# Patient Record
Sex: Male | Born: 1940
Health system: Southern US, Community
[De-identification: ages and names within clinical notes are randomized; demographics above are authoritative.]

## PROBLEM LIST (undated history)

## (undated) DIAGNOSIS — K5790 Diverticulosis of intestine, part unspecified, without perforation or abscess without bleeding: Secondary | ICD-10-CM

## (undated) DIAGNOSIS — K219 Gastro-esophageal reflux disease without esophagitis: Secondary | ICD-10-CM

## (undated) DIAGNOSIS — M1990 Unspecified osteoarthritis, unspecified site: Secondary | ICD-10-CM

## (undated) DIAGNOSIS — E785 Hyperlipidemia, unspecified: Secondary | ICD-10-CM

## (undated) HISTORY — DX: Gastro-esophageal reflux disease without esophagitis: K21.9

## (undated) HISTORY — DX: Unspecified osteoarthritis, unspecified site: M19.90

## (undated) HISTORY — PX: EYE SURGERY: SHX253

## (undated) HISTORY — DX: Diverticulosis of intestine, part unspecified, without perforation or abscess without bleeding: K57.90

## (undated) HISTORY — DX: Hyperlipidemia, unspecified: E78.5

---

## 2011-10-22 ENCOUNTER — Ambulatory Visit: Payer: Medicare Other

## 2011-10-22 ENCOUNTER — Encounter: Payer: Self-pay | Admitting: Internal Medicine

## 2011-10-22 ENCOUNTER — Ambulatory Visit (INDEPENDENT_AMBULATORY_CARE_PROVIDER_SITE_OTHER): Payer: Medicare Other | Admitting: Family Medicine

## 2011-10-22 VITALS — BP 136/74 | HR 48 | Temp 97.7°F | Resp 14 | Ht 70.5 in | Wt 177.0 lb

## 2011-10-22 DIAGNOSIS — M545 Low back pain, unspecified: Secondary | ICD-10-CM | POA: Diagnosis not present

## 2011-10-22 DIAGNOSIS — R109 Unspecified abdominal pain: Secondary | ICD-10-CM

## 2011-10-22 DIAGNOSIS — M25519 Pain in unspecified shoulder: Secondary | ICD-10-CM | POA: Diagnosis not present

## 2011-10-22 DIAGNOSIS — E7800 Pure hypercholesterolemia, unspecified: Secondary | ICD-10-CM

## 2011-10-22 LAB — COMPREHENSIVE METABOLIC PANEL
ALT: 16 U/L (ref 0–53)
AST: 22 U/L (ref 0–37)
Albumin: 4.7 g/dL (ref 3.5–5.2)
Alkaline Phosphatase: 76 U/L (ref 39–117)
BUN: 18 mg/dL (ref 6–23)
CO2: 28 mEq/L (ref 19–32)
Calcium: 9.6 mg/dL (ref 8.4–10.5)
Chloride: 105 mEq/L (ref 96–112)
Creat: 1.07 mg/dL (ref 0.50–1.35)
Glucose, Bld: 87 mg/dL (ref 70–99)
Potassium: 4.4 mEq/L (ref 3.5–5.3)
Sodium: 141 mEq/L (ref 135–145)
Total Bilirubin: 0.7 mg/dL (ref 0.3–1.2)
Total Protein: 7.5 g/dL (ref 6.0–8.3)

## 2011-10-22 LAB — POCT URINALYSIS DIPSTICK
Glucose, UA: NEGATIVE
Ketones, UA: NEGATIVE
Leukocytes, UA: NEGATIVE
Nitrite, UA: NEGATIVE
Protein, UA: NEGATIVE
Spec Grav, UA: 1.02
Urobilinogen, UA: 0.2
pH, UA: 5

## 2011-10-22 LAB — POCT UA - MICROSCOPIC ONLY
Bacteria, U Microscopic: NEGATIVE
Casts, Ur, LPF, POC: NEGATIVE
Crystals, Ur, HPF, POC: NEGATIVE
Mucus, UA: NEGATIVE
WBC, Ur, HPF, POC: NEGATIVE
Yeast, UA: NEGATIVE

## 2011-10-22 LAB — POCT CBC
Granulocyte percent: 62.7 %G (ref 37–80)
HCT, POC: 45.6 % (ref 43.5–53.7)
Hemoglobin: 14.7 g/dL (ref 14.1–18.1)
Lymph, poc: 3.6 — AB (ref 0.6–3.4)
MCH, POC: 30.9 pg (ref 27–31.2)
MCHC: 32.2 g/dL (ref 31.8–35.4)
MCV: 95.9 fL (ref 80–97)
MID (cbc): 0.7 (ref 0–0.9)
MPV: 10.4 fL (ref 0–99.8)
POC Granulocyte: 7.1 — AB (ref 2–6.9)
POC LYMPH PERCENT: 31.5 %L (ref 10–50)
POC MID %: 5.8 %M (ref 0–12)
Platelet Count, POC: 284 10*3/uL (ref 142–424)
RBC: 4.75 M/uL (ref 4.69–6.13)
RDW, POC: 12.3 %
WBC: 11.3 10*3/uL — AB (ref 4.6–10.2)

## 2011-10-22 LAB — LIPID PANEL
Cholesterol: 224 mg/dL — ABNORMAL HIGH (ref 0–200)
HDL: 39 mg/dL — ABNORMAL LOW (ref >39)
LDL Cholesterol: 151 mg/dL — ABNORMAL HIGH (ref 0–99)
Total CHOL/HDL Ratio: 5.7 Ratio
Triglycerides: 168 mg/dL — ABNORMAL HIGH (ref <150)
VLDL: 34 mg/dL (ref 0–40)

## 2011-10-22 MED ORDER — CYCLOBENZAPRINE HCL 10 MG PO TABS: 10.0000 mg | ORAL_TABLET | Freq: Two times a day (BID) | ORAL | Status: AC | PRN

## 2011-10-22 NOTE — Progress Notes (Signed)
Patient Name: Fernando Saunders Date of Birth: [birth year] Medical Record Number: [id number] Gender: male Date of Encounter: 10/22/2011  History of Present Illness:  Fernando Saunders is a 71 y.o. very pleasant male patient who presents with the following:  Having right lower back pain, bilateral shoulder pain, abdominal pain today.  He does not feel that these problems are related.   Has notes the right lower back pain for about 2 weeks.  Has never had this before.  No blood in urine or dysuria. No known injury.  He is worried about his kidneys and would like them to be checked.  Left back is not painful.    Also has right and left shoulder pain especially with certain movements or at night for a couple of weeks. This is exacerbated by playing softball which he does regularly- 3rd base.   Also notes that he "ate something" bad which seemed to cause abdominal pains and diarrhea.  This occurred 10 days ago- that day he had several stools and some sharp pains.  Continues to have occasional pains, no nausea or vomiting or diarrhea- is eating ok now.  The pain lasts for a minute or so only.   History of high cholesterol but he is no longer taking his medications.  He is fasting today and would like to have this checked.    [name] is [place] here today with his daughter who helped with interpretation  There is no problem list on file for this patient.  No past medical history on file. No past surgical history on file. History  Substance Use Topics  . Smoking status: Never Smoker   . Smokeless tobacco: Not on file  . Alcohol Use: Not on file   No family history on file. No Known Allergies  Medication list has been reviewed and updated.  Review of Systems: As per HPI- otherwise negative.   Physical Examination: Filed Vitals:   10/22/11 0852  BP: 136/74  Pulse: 48  Temp: 97.7 F (36.5 C)  TempSrc: Oral  Resp: 14  Height: 5' 10.5" (1.791 m)  Weight: 177 lb (80.287 kg)    reviewed chart- bradycardia is baseline for him  Body mass index is 25.04 kg/(m^2).  GEN: WDWN, NAD, Non-toxic, A & O x 3 HEENT: Atraumatic, Normocephalic. Neck supple. No masses, No LAD. Ears and Nose: No external deformity. CV: RRR, No M/G/R. No JVD. No thrill. No extra heart sounds. PULM: CTA B, no wheezes, crackles, rhonchi. No retractions. No resp. distress. No accessory muscle use. ABD: S, NT, ND, +BS. No rebound. No HSM.  Cannot reproduce abdominal pain EXTR: No c/c/e NEURO Normal gait.  PSYCH: Normally interactive. Conversant. Not depressed or anxious appearing.  Calm demeanor.  Indicates tenderness at right lower back, but negative straight leg raise, normal DTR.  Good flexion and extension of back Right shoulder: indicates tenderness with external and internal rotation, but has good ROM in all directions including abduction and flexion.  Left "shoulder" tenderness is actually trapezius tenderness.  Shoulder has normal ROM and no pain  Results for orders placed in visit on 10/22/11  POCT CBC      Component Value Range   WBC 11.3 (*) 4.6 - 10.2 (K/uL)   Lymph, poc 3.6 (*) 0.6 - 3.4    POC LYMPH PERCENT 31.5  10 - 50 (%L)   MID (cbc) 0.7  0 - 0.9    POC MID % 5.8  0 - 12 (%M)   POC Granulocyte 7.1 (*)  2 - 6.9    Granulocyte percent 62.7  37 - 80 (%G)   RBC 4.75  4.69 - 6.13 (M/uL)   Hemoglobin 14.7  14.1 - 18.1 (g/dL)   HCT, POC 40.9  81.1 - 53.7 (%)   MCV 95.9  80 - 97 (fL)   MCH, POC 30.9  27 - 31.2 (pg)   MCHC 32.2  31.8 - 35.4 (g/dL)   RDW, POC 91.4     Platelet Count, POC 284  142 - 424 (K/uL)   MPV 10.4  0 - 99.8 (fL)  POCT URINALYSIS DIPSTICK      Component Value Range   Color, UA yellow     Clarity, UA clear     Glucose, UA neg     Bilirubin, UA eng     Ketones, UA neg     Spec Grav, UA 1.020     Blood, UA trace-intact     pH, UA 5.0     Protein, UA neg     Urobilinogen, UA 0.2     Nitrite, UA neg     Leukocytes, UA Negative    POCT UA - MICROSCOPIC  ONLY      Component Value Range   WBC, Ur, HPF, POC neg     RBC, urine, microscopic 0-1     Bacteria, U Microscopic neg     Mucus, UA neg     Epithelial cells, urine per micros 0-1     Crystals, Ur, HPF, POC neg     Casts, Ur, LPF, POC neg     Yeast, UA neg     [facility] reading (PRIMARY) by  Dr. Patsy Lager.  Mild degenerative change especially L2-3  LUMBAR SPINE - 2-3 VIEW  Comparison: None.  Findings: Normal lumbar segmentation. Multilevel degenerative endplate spurring. Relatively preserved disc spaces and vertebral body height. No spondylolisthesis. Bone mineralization is within normal limits. Mild to moderate L5-S1 facet hypertrophy. Negative visualized sacrum.  IMPRESSION: Degenerative endplate spurring. No acute osseous abnormality in the lumbar spine.   Assessment and Plan: 1. Lumbar pain  POCT urinalysis dipstick, POCT UA - Microscopic Only, DG Lumbar Spine 2-3 Views, cyclobenzaprine (FLEXERIL) 10 MG tablet  2. High cholesterol  POCT CBC, Comprehensive metabolic panel, Lipid panel  3. Shoulder pain    4. Abdominal  pain, other specified site     Lumbar pain seems to be MSK in origin.  Gave flexeril to use PRN- may also be helpful for right RCT and left trapezius strain.  Reassured that UA looks fine, but will further check kidneys with CMP and will follow-up pending these results.   [name] is not interested in an ortho referral as of yet for his shoulder, but will let me know if he changes his mind.  Given handout of RCT exercises to do.  He may need to avoid doing a lot of throwing as he does in softball practice to allow this to heal.   Abdominal pain: lack of any tenderness with exam and description of pain is reassuring, but need to watch carefully especially with slightly elevated wbc count.  He has never had a colonoscopy explained the importance of this and they do plan to arrange with [place] asap. In the meantime will await CMP but let me know if symptoms change or get  worse in the meantime.

## 2011-10-22 NOTE — Patient Instructions (Signed)
May use tylenol arthritis as needed for back and shoulder pain.   Flexeril as needed for pain.   Let me know if you are not getting better- sooner if worse!

## 2011-10-24 ENCOUNTER — Ambulatory Visit (AMBULATORY_SURGERY_CENTER): Payer: Medicare Other | Admitting: *Deleted

## 2011-10-24 ENCOUNTER — Encounter: Payer: Self-pay | Admitting: Family Medicine

## 2011-10-24 VITALS — Ht 71.0 in | Wt 182.2 lb

## 2011-10-24 DIAGNOSIS — Z1211 Encounter for screening for malignant neoplasm of colon: Secondary | ICD-10-CM

## 2011-10-24 MED ORDER — PEG-KCL-NACL-NASULF-NA ASC-C 100 G PO SOLR: ORAL | Status: DC

## 2011-10-24 MED ORDER — PRAVASTATIN SODIUM 40 MG PO TABS
40.0000 mg | ORAL_TABLET | Freq: Every day | ORAL | Status: DC
Start: 2011-10-24 — End: 2013-11-12

## 2011-10-24 NOTE — Progress Notes (Signed)
Pt here for PV for screening colonoscopy.  Pt speaks very little [place]; his daughter is with him to interpret.  Pt says that he is having problems with abdominal pain. Pain began 2 weeks ago in the evening.  He described the pain as a dull pain below the umbilicus and said it was a 10 on the pain scale and kept him up most of the night.  Since then he has continued to have intermittent abdominal pain below umbilicus after eating and rates the pain as 3 on pain scale when it occurs.  He denies nausea and vomiting; no change in bowel habits, no rectal bleeding, no diarrhea or constipation.  Colonoscopy scheduled for 5/6 cancelled and new patient appointment with Dr. Leone Payor scheduled for 5/10 at 4:00.  Ezra Sites

## 2011-10-24 NOTE — Progress Notes (Signed)
Addended by: Abbe Amsterdam C on: 10/24/2011 11:59 AM   Modules accepted: Orders

## 2011-10-25 ENCOUNTER — Telehealth: Payer: Self-pay | Admitting: Family Medicine

## 2011-10-25 NOTE — Telephone Encounter (Signed)
Called and spoke to his daughter again.  Phone note from GI nurse sounds like his abdominal pain is more severe than he admitted at our visit.  If he is having pain that is lasting more than just a minute or two, please bring him in for a recheck right away.  She agreed and will keep a close eye on her dad

## 2011-10-29 ENCOUNTER — Encounter: Payer: Self-pay | Admitting: Internal Medicine

## 2011-10-29 ENCOUNTER — Ambulatory Visit (INDEPENDENT_AMBULATORY_CARE_PROVIDER_SITE_OTHER): Payer: Medicare Other | Admitting: Internal Medicine

## 2011-10-29 VITALS — BP 132/74 | HR 88

## 2011-10-29 DIAGNOSIS — Z1211 Encounter for screening for malignant neoplasm of colon: Secondary | ICD-10-CM

## 2011-10-29 DIAGNOSIS — R1033 Periumbilical pain: Secondary | ICD-10-CM | POA: Diagnosis not present

## 2011-10-29 DIAGNOSIS — K219 Gastro-esophageal reflux disease without esophagitis: Secondary | ICD-10-CM | POA: Diagnosis not present

## 2011-10-29 MED ORDER — OMEPRAZOLE MAGNESIUM 20 MG PO TBEC: 20.0000 mg | DELAYED_RELEASE_TABLET | Freq: Every day | ORAL | Status: DC

## 2011-10-29 NOTE — Patient Instructions (Signed)
You have been scheduled for a colonoscopy with propofol. Please follow written instructions given to you at your visit today.     Reflujo gastroesofgico - Adultos  (Gastroesophageal Reflux Disease, Adult)  El reflujo gastroesofgico ocurre cuando el cido del estmago pasa al esfago. Cuando el cido entra en contacto con el esfago, el cido provoca dolor (inflamacin) en el esfago. Con el tiempo, pueden formarse pequeos agujeros (lceras) en el revestimiento del esfago. CAUSAS   Exceso de Runner, broadcasting/film/video. Esto aplica presin [employer], lo que hace que el cido del estmago suba hacia el esfago.   El hbito de fumar [place] la produccin de cido en el [place].   El consumo de alcohol. Provoca disminucin de la presin en el esfnter esofgico inferior (vlvula o anillo de msculo entre el esfago y Investment banker, corporate), permitiendo que el cido del estmago suba hacia el esfago.   Cenas a ltima hora del da y estmago lleno. Aumenta la presin y la produccin de cido en el estmago.   Malformacin en el esfnter esofgico inferior.  A menudo no se halla causa.  SNTOMAS   Ardor y Radiographer, therapeutic parte inferior del pecho detrs del esternn y en la zona media del [place]. Puede ocurrir [employer] por semana o ms a menudo.   Dificultad para tragar.   Dolor de Advertising copywriter.   Tos seca.   Sntomas similares al asma que incluyen sensacin de opresin en el pecho, falta de aire y sibilancias.  DIAGNSTICO  El mdico diagnosticar el problema basndose en los sntomas. En algunos casos, se indican radiografas y otras pruebas para verificar si hay complicaciones o para comprobar el estado del [address] y Training and development officer.  TRATAMIENTO  El mdico le indicar medicamentos de venta libre o recetados para ayudar a disminuir la produccin de cido. Consulte con su mdico antes de Corporate investment banker o agregar cualquier medicamento nuevo.  INSTRUCCIONES PARA EL CUIDADO EN EL HOGAR   Modifique los factores que  pueda cambiar. Consulte con su mdico para solicitar orientacin relacionada con la prdida de peso, dejar de fumar y el consumo de alcohol.   Evite las comidas y bebidas que [address] [place], [place]:   [place] con cafena o alcohlicas.   Chocolate.   Sabores a Advertising account planner.   Ajo y cebolla.   Comidas muy condimentadas.   Ctricos como naranjas, limones o limas.   Alimentos que contengan tomate, como salsas, [place] y pizza.   Alimentos fritos y Lexicographer.   Evite acostarse durante 3 horas antes de irse a dormir o antes de tomar una siesta.   Haga comidas pequeas durante Glass blower/designer de 3 comidas abundantes.   Use ropas sueltas. No use nada apretado alrededor de la cintura que cause presin en el estmago.   Levante (eleve) la cabecera de la cama 6 a 8 pulgadas (15 a 20 cm) con bloques de madera. Usar almohadas extra no ayuda.   Solo tome medicamentos que se pueden comprar sin receta o recetados para el dolor, Dentist o fiebre, como le indica el mdico.   No tome aspirina, ibuprofeno ni antiinflamatorios no esteroides.  SOLICITE ATENCIN MDICA DE [employer] SI:   [employer], el cuello, la [place], los dientes o la espalda.   El dolor aumenta o cambia la intensidad o la durancin.   Tiene nuseas, vmitos o sudoracin(diaforesis).   Siente falta de aire o dolor en el pecho, o se desmaya.   Vomita y el vmito tiene [place], [place]  de color verde, amarillo, negro o es similar a la borra del caf o tiene [place].   Las heces son rojas, sanguinolentas o negras.  Estos sntomas pueden ser signos de [address] - [address], como enfermedades cardacas, hemorragias gstrias o sangrado esofgico.  ASEGRESE DE QUE:   Comprende estas instrucciones.   Controlar su enfermedad.   Solicitar ayuda de inmediato si no mejora o si empeora.  Document Released: 03/27/2005 Document Revised: 06/06/2011 [facility] Patient Information 2012 [place], [place].

## 2011-10-29 NOTE — Progress Notes (Signed)
Subjective:    Patient ID: Fernando Saunders, male    DOB: 06-11-1941, 71 y.o.   MRN: [id number]  HPI This 71 year old married man, from the [place], is here with his daughter today to discuss problems with periumbilical pain and chest discomfort. For several months or more he said burning left chest discomfort, also described as a pressure, that is relieved with belching, and sometimes requires drinking carbonated beverages to induce this belch with relief. Sometimes it will get this with exertion but always is relieved by these maneuvers above. In general it occurs after eating. More recently, about 2 weeks ago he had some sort of student does seem to upset his stomach and he developed crampy periumbilical pain and one day of diarrhea. There is no further diarrhea but there is some residual periumbilical sharp pain at times. He will get pain that radiates up into the epigastrium and left chest area. There is no dysphagia. He has not lost any weight. There are clear foods other than the student will precipitate these symptoms. He has not tried any antacids or other medications. There has been no bleeding and his hemoglobin is normal.  GI review of systems is otherwise negative.  History was taken through his daughter, serving as interpreter. Medications, allergies, past medical history, past surgical history, family history and social history are reviewed and updated in the EMR.   Review of Systems This is positive for recently been diagnosed with hyperlipidemia and starting pravastatin, he has low-back pain that is improved with cyclobenzaprine, the remainder of the review of systems is negative or as per the history of present illness.    Objective:   Physical Exam General:  Well-developed, well-nourished and in no acute distress Eyes:  anicteric. Bilateral arcus ENT:   Mouth and posterior pharynx free of lesions.  Neck:   supple w/o thyromegaly or mass.  Lungs: Clear to  auscultation bilaterally. Heart:  S1S2, no rubs, murmurs, gallops. Abdomen:  soft, non-tender, no hepatosplenomegaly, hernia, or mass and BS+.  Rectal: Deferred until colonoscopy Lymph:  no cervical or supraclavicular adenopathy. Extremities:   no edema Skin   no rash. Neuro:  A&O x 3.  Psych:  appropriate mood and  Affect.   Data Reviewed: Lab Results  Component Value Date   WBC 11.3* 10/22/2011   HGB 14.7 10/22/2011   HCT 45.6 10/22/2011   MCV 95.9 10/22/2011     Chemistry      Component Value Date/Time   NA 141 10/22/2011 1037   K 4.4 10/22/2011 1037   CL 105 10/22/2011 1037   CO2 28 10/22/2011 1037   BUN 18 10/22/2011 1037   CREATININE 1.07 10/22/2011 1037      Component Value Date/Time   CALCIUM 9.6 10/22/2011 1037   ALKPHOS 76 10/22/2011 1037   AST 22 10/22/2011 1037   ALT 16 10/22/2011 1037   BILITOT 0.7 10/22/2011 1037      I have reviewed primary care notes of 10/22/2011 also noted a urinalysis was negative.       Assessment & Plan:   1. GERD (gastroesophageal reflux disease)   2. Periumbilical abdominal pain   I think these pains  in the left chest and periumbilical region are probably GERD. The periumbilical pain could be some residual from a gastroenteritis it seems like he had 2 weeks ago. He will start Prilosec OTC 20 mg daily before breakfast. Samples and prescription given. GERD instructions to be provided to the patient as well. We'll reassess at  colonoscopy and plan for office followup. He may not need long term continuous PPI, we'll see about moving to intermittent therapy or H2 blocker possibly over time. Should he develop dysphagia, bleeding or weight loss than pursue upper endoscopy but he does not have any warning signs or symptoms endoscopy not indicated now.   3. Special screening for malignant neoplasms, colon   Colonoscopy appointment will be arranged.     CC: Abbe Amsterdam, MD

## 2011-11-04 ENCOUNTER — Ambulatory Visit (AMBULATORY_SURGERY_CENTER): Payer: Medicare Other | Admitting: Internal Medicine

## 2011-11-04 ENCOUNTER — Encounter: Payer: Self-pay | Admitting: Internal Medicine

## 2011-11-04 ENCOUNTER — Other Ambulatory Visit: Payer: Self-pay | Admitting: Internal Medicine

## 2011-11-04 VITALS — BP 146/73 | HR 57 | Temp 95.5°F | Resp 14 | Ht 71.0 in | Wt 177.0 lb

## 2011-11-04 DIAGNOSIS — Z1211 Encounter for screening for malignant neoplasm of colon: Secondary | ICD-10-CM

## 2011-11-04 DIAGNOSIS — R1033 Periumbilical pain: Secondary | ICD-10-CM

## 2011-11-04 DIAGNOSIS — K5730 Diverticulosis of large intestine without perforation or abscess without bleeding: Secondary | ICD-10-CM

## 2011-11-04 DIAGNOSIS — K648 Other hemorrhoids: Secondary | ICD-10-CM

## 2011-11-04 HISTORY — PX: COLONOSCOPY: SHX174

## 2011-11-04 MED ORDER — SODIUM CHLORIDE 0.9 % IV SOLN: 500.0000 mL | INTRAVENOUS | Status: DC

## 2011-11-04 NOTE — Op Note (Signed)
[facility] [address] [place], [place]  [address]  COLONOSCOPY PROCEDURE REPORT  PATIENT:  Fernando, Saunders  MR#:  [id number] BIRTHDATE:  02-08-1941, 70 yrs. old  GENDER:  male ENDOSCOPIST:  Iva Boop, MD, [facility] REF. BY:  Abbe Amsterdam, M.D. PROCEDURE DATE:  11/04/2011 PROCEDURE:  Average-risk screening colonoscopy G0121 ASA CLASS:  Class I INDICATIONS:  Routine Risk Screening MEDICATIONS:   These medications were titrated to patient response per physician's verbal order, Fentanyl 75 mcg IV, Versed 10 mg IV  DESCRIPTION OF PROCEDURE:   After the risks benefits and alternatives of the procedure were thoroughly explained, informed consent was obtained.  Digital rectal exam was performed and revealed no abnormalities and normal prostate.   The LB CF-[id number] endoscope was introduced through the anus and advanced to the cecum, which was identified by both the appendix and ileocecal valve, without limitations.  The quality of the prep was excellent, using MoviPrep.  The instrument was then slowly withdrawn as the colon was fully examined. <<PROCEDUREIMAGES>>  FINDINGS:  Mild diverticulosis was found in the sigmoid colon. This was otherwise a normal examination of the colon. Including right colon retroflexion.   Retroflexed views in the rectum revealed no abnormalities.    The time to cecum = 4:47 minutes. The scope was then withdrawn in 11:28 minutes from the cecum and the procedure completed. COMPLICATIONS:  None ENDOSCOPIC IMPRESSION: 1) Mild diverticulosis in the sigmoid colon 2) Otherwise normal examination, excellent prep  RECOMMENDATIONS: 1) patient to call and schedule return visit with Dr. Leone Payor for July 2013- follow-up GERD and periumbilical abdominal pain  REPEAT EXAM:  In 10 year(s) for routine screening colonoscopy.  Iva Boop, MD, [facility]  CC:  Abbe Amsterdam, MD and The Patient  n. eSIGNED:   Iva Boop at 11/04/2011  09:08 AM  Manuella Ghazi, [place], [phone]

## 2011-11-04 NOTE — Patient Instructions (Addendum)
Handouts were given to your care partner on diverticulosis, high fiber diet and hemorrhoids in spanish..  Please call if any questions or concerns.    YOU HAD AN ENDOSCOPIC PROCEDURE TODAY AT THE [facility]: Refer to the procedure report that was given to you for any specific questions about what was found during the examination.  If the procedure report does not answer your questions, please call your gastroenterologist to clarify.  If you requested that your care partner not be given the details of your procedure findings, then the procedure report has been included in a sealed envelope for you to review at your convenience later.  YOU SHOULD EXPECT: Some feelings of bloating in the abdomen. Passage of more gas than usual.  Walking can help get rid of the air that was put into your GI tract during the procedure and reduce the bloating. If you had a lower endoscopy (such as a colonoscopy or flexible sigmoidoscopy) you may notice spotting of blood in your stool or on the toilet paper. If you underwent a bowel prep for your procedure, then you may not have a normal bowel movement for a few days.  DIET: Your first meal following the procedure should be a light meal and then it is ok to progress to your normal diet.  A half-sandwich or bowl of soup is an example of a good first meal.  Heavy or fried foods are harder to digest and may make you feel nauseous or bloated.  Likewise meals heavy in dairy and vegetables can cause extra gas to form and this can also increase the bloating.  Drink plenty of fluids but you should avoid alcoholic beverages for 24 hours.  ACTIVITY: Your care partner should take you home directly after the procedure.  You should plan to take it easy, moving slowly for the rest of the day.  You can resume normal activity the day after the procedure however you should NOT DRIVE or use heavy machinery for 24 hours (because of the sedation medicines used during the test).     SYMPTOMS TO REPORT IMMEDIATELY: A gastroenterologist can be reached at any hour.  During normal business hours, 8:30 AM to 5:00 PM Monday through Friday, call [phone].  After hours and on weekends, please call the GI answering service at [phone] who will take a message and have the physician on call contact you.   Following lower endoscopy (colonoscopy or flexible sigmoidoscopy):  Excessive amounts of blood in the stool  Significant tenderness or worsening of abdominal pains  Swelling of the abdomen that is new, acute  Fever of 100F or higher    FOLLOW UP: If any biopsies were taken you will be contacted by phone or by letter within the next 1-3 weeks.  Call your gastroenterologist if you have not heard about the biopsies in 3 weeks.  Our staff will call the home number listed on your records the next business day following your procedure to check on you and address any questions or concerns that you may have at that time regarding the information given to you following your procedure. This is a courtesy call and so if there is no answer at the home number and we have not heard from you through the emergency physician on call, we will assume that you have returned to your regular daily activities without incident.  SIGNATURES/CONFIDENTIALITY: You and/or your care partner have signed paperwork which will be entered into your electronic medical record.  These  signatures attest to the fact that that the information above on your After Visit Summary has been reviewed and is understood.  Full responsibility of the confidentiality of this discharge information lies with you and/or your care-partner.

## 2011-11-04 NOTE — Progress Notes (Signed)
Pressure applied to the abdomen to reach cecum. 

## 2011-11-04 NOTE — Progress Notes (Signed)
Patient did not experience any of the following events: a burn prior to discharge; a fall within the facility; wrong site/side/patient/procedure/implant event; or a hospital transfer or hospital admission upon discharge from the facility. (G8907) Patient did not have preoperative order for IV antibiotic SSI prophylaxis. (G8918)  

## 2011-11-04 NOTE — Progress Notes (Signed)
The pt's daughter is his interpreter, Derald Macleod.  Maw  No complaints noted in the recovery room. Maw

## 2011-11-05 ENCOUNTER — Telehealth: Payer: Self-pay

## 2011-11-05 NOTE — Telephone Encounter (Signed)
  Follow up Call-  Call back number 11/04/2011  Post procedure Call Back phone  # [phone]  Permission to leave phone message Yes     Patient questions:  Do you have a fever, pain , or abdominal swelling? no Pain Score  0 *  Have you tolerated food without any problems? yes  Have you been able to return to your normal activities? yes  Do you have any questions about your discharge instructions: Diet   no Medications  no Follow up visit  no  Do you have questions or concerns about your Care? no  Actions: * If pain score is 4 or above: No action needed, pain <4.

## 2011-11-08 ENCOUNTER — Ambulatory Visit: Payer: Medicare Other | Admitting: Internal Medicine

## 2011-11-28 ENCOUNTER — Ambulatory Visit: Payer: Medicare Other | Admitting: Internal Medicine

## 2012-01-17 ENCOUNTER — Encounter: Payer: Self-pay | Admitting: *Deleted

## 2012-01-20 ENCOUNTER — Ambulatory Visit: Payer: Medicare Other | Admitting: Internal Medicine

## 2012-03-04 ENCOUNTER — Ambulatory Visit (INDEPENDENT_AMBULATORY_CARE_PROVIDER_SITE_OTHER): Payer: Medicare Other | Admitting: Family Medicine

## 2012-03-04 ENCOUNTER — Ambulatory Visit (INDEPENDENT_AMBULATORY_CARE_PROVIDER_SITE_OTHER): Payer: Medicare Other | Admitting: Internal Medicine

## 2012-03-04 ENCOUNTER — Encounter: Payer: Self-pay | Admitting: Internal Medicine

## 2012-03-04 VITALS — BP 130/68 | HR 64 | Ht 71.0 in | Wt 181.8 lb

## 2012-03-04 VITALS — BP 137/66 | HR 52 | Temp 97.9°F | Resp 16 | Ht 71.0 in | Wt 181.0 lb

## 2012-03-04 DIAGNOSIS — K219 Gastro-esophageal reflux disease without esophagitis: Secondary | ICD-10-CM | POA: Diagnosis not present

## 2012-03-04 DIAGNOSIS — E7800 Pure hypercholesterolemia, unspecified: Secondary | ICD-10-CM | POA: Diagnosis not present

## 2012-03-04 DIAGNOSIS — M79609 Pain in unspecified limb: Secondary | ICD-10-CM

## 2012-03-04 DIAGNOSIS — M549 Dorsalgia, unspecified: Secondary | ICD-10-CM

## 2012-03-04 DIAGNOSIS — D72829 Elevated white blood cell count, unspecified: Secondary | ICD-10-CM | POA: Diagnosis not present

## 2012-03-04 LAB — POCT CBC
Granulocyte percent: 56.3 %G (ref 37–80)
HCT, POC: 44.9 % (ref 43.5–53.7)
Hemoglobin: 13.9 g/dL — AB (ref 14.1–18.1)
Lymph, poc: 2.5 (ref 0.6–3.4)
MCH, POC: 30.5 pg (ref 27–31.2)
MCHC: 31 g/dL — AB (ref 31.8–35.4)
MCV: 98.6 fL — AB (ref 80–97)
MID (cbc): 0.4 (ref 0–0.9)
MPV: 10.6 fL (ref 0–99.8)
POC Granulocyte: 3.8 (ref 2–6.9)
POC LYMPH PERCENT: 37.4 %L (ref 10–50)
POC MID %: 6.3 %M (ref 0–12)
Platelet Count, POC: 234 10*3/uL (ref 142–424)
RBC: 4.55 M/uL — AB (ref 4.69–6.13)
RDW, POC: 13.1 %
WBC: 6.7 10*3/uL (ref 4.6–10.2)

## 2012-03-04 LAB — LDL CHOLESTEROL, DIRECT: Direct LDL: 97 mg/dL

## 2012-03-04 MED ORDER — CYCLOBENZAPRINE HCL 10 MG PO TABS: 10.0000 mg | ORAL_TABLET | Freq: Every evening | ORAL | Status: AC | PRN

## 2012-03-04 NOTE — Patient Instructions (Addendum)
Please continue your rx of Prilosec OTC per Dr. Leone Payor.  Come back and see [place] as needed.  Thank you for choosing me and [facility].  Iva Boop, M.D., [facility]

## 2012-03-04 NOTE — Progress Notes (Signed)
[facility] [address] [place] [address] [phone]  Date:  03/04/2012   Name:  Fernando Saunders   DOB:  1941/03/08   MRN:  [id number]  PCP:  Abbe Amsterdam, MD    Chief Complaint: Follow-up   History of Present Illness:  Fernando Saunders is a 71 y.o. very pleasant male patient who presents with the following:  He was here in April of this year with complaint of several different symptoms including right lower back pain.  He had negative lumbar x-ray and normal CMP.  He is taking tylenol arthritis- this does seem to help some.  He got his colonoscopy as we had discussed- looked ok.    The pain has bothered him since April.  He is worse in the morning, and he has a lot of problems with bending over/ getting dressed.  No radiation into his leg.    We started pravachol for his high cholesterol in April- he would like to have his CHL checked today.  He is not fasting  Patient Active Problem List  Diagnosis  . GERD (gastroesophageal reflux disease)    Past Medical History  Diagnosis Date  . Arthritis   . Hyperlipidemia   . Diverticulosis   . GERD (gastroesophageal reflux disease)     Past Surgical History  Procedure Date  . Colonoscopy 11/04/2011    Dr. Stan Head    History  Substance Use Topics  . Smoking status: Never Smoker   . Smokeless tobacco: Never Used  . Alcohol Use: Yes     occasional    Family History  Problem Relation Age of Onset  . Colon cancer Neg Hx   . Stomach cancer Neg Hx   . Diabetes Mother     No Known Allergies  Medication list has been reviewed and updated.  Current Outpatient Prescriptions on File Prior to Visit  Medication Sig Dispense Refill  . Acetaminophen (TYLENOL ARTHRITIS PAIN PO) Take by mouth as needed.      Marland Kitchen aspirin 81 MG tablet Take 81 mg by mouth daily.      . Multiple Vitamin (MULTIVITAMIN) tablet Take 1 tablet by mouth daily.      Marland Kitchen omeprazole (PRILOSEC OTC) 20 MG tablet Take 1 tablet (20 mg  total) by mouth daily.  42 tablet  3  . pravastatin (PRAVACHOL) 40 MG tablet Take 1 tablet (40 mg total) by mouth daily.  90 tablet  3   Current Facility-Administered Medications on File Prior to Visit  Medication Dose Route Frequency Provider Last Rate Last Dose  . DISCONTD: 0.9 %  sodium chloride infusion  500 mL Intravenous Continuous Iva Boop, MD        Review of Systems:  As per HPI- otherwise negative.   Physical Examination: Filed Vitals:   03/04/12 1403  BP: 137/66  Pulse: 52  Temp: 97.9 F (36.6 C)  Resp: 16   Filed Vitals:   03/04/12 1403  Height: 5\' 11"  (1.803 m)  Weight: 181 lb (82.101 kg)   Body mass index is 25.24 kg/(m^2). Ideal Body Weight: Weight in (lb) to have BMI = 25: 178.9   GEN: WDWN, NAD, Non-toxic, A & O x 3 HEENT: Atraumatic, Normocephalic. Neck supple. No masses, No LAD.  TM and oropharynx wnl, PEERL, EOMI Ears and Nose: No external deformity. CV: RRR, No M/G/R. No JVD. No thrill. No extra heart sounds. PULM: CTA B, no wheezes, crackles, rhonchi. No retractions. No resp. distress. No accessory muscle  use. Back: he has some tenderness in the right lower back- may be SI joint but seems superior.  Normal flexion but is stiff with standing up straight again.  Normal leg strength and sensation, normal SLR bilaterally.   EXTR: No c/c/e NEURO Normal gait.  PSYCH: Normally interactive. Conversant. Not depressed or anxious appearing.  Calm demeanor.   Results for orders placed in visit on 03/04/12  POCT CBC      Component Value Range   WBC 6.7  4.6 - 10.2 K/uL   Lymph, poc 2.5  0.6 - 3.4   POC LYMPH PERCENT 37.4  10 - 50 %L   MID (cbc) 0.4  0 - 0.9   POC MID % 6.3  0 - 12 %M   POC Granulocyte 3.8  2 - 6.9   Granulocyte percent 56.3  37 - 80 %G   RBC 4.55 (*) 4.69 - 6.13 M/uL   Hemoglobin 13.9 (*) 14.1 - 18.1 g/dL   HCT, POC 16.1  09.6 - 53.7 %   MCV 98.6 (*) 80 - 97 fL   MCH, POC 30.5  27 - 31.2 pg   MCHC 31.0 (*) 31.8 - 35.4 g/dL   RDW,  POC 04.5     Platelet Count, POC 234  142 - 424 K/uL   MPV 10.6  0 - 99.8 fL   LUMBAR SPINE - 2-3 VIEW from 10/22/11 Comparison: None.  Findings: Normal lumbar segmentation. Multilevel degenerative  endplate spurring. Relatively preserved disc spaces and vertebral  body height. No spondylolisthesis. Bone mineralization is within  normal limits. Mild to moderate L5-S1 facet hypertrophy. Negative  visualized sacrum.  IMPRESSION:  Degenerative endplate spurring. No acute osseous abnormality in the  lumbar spine.   Assessment and Plan: 1. Back pain  cyclobenzaprine (FLEXERIL) 10 MG tablet, Ambulatory referral to Orthopedic Surgery  2. High cholesterol  cyclobenzaprine (FLEXERIL) 10 MG tablet, LDL cholesterol, direct  3. Leukocytosis  POCT CBC   Will check LDL.  Mild leukocytosis noted at last visit is resolved.  Will try flexeril, but as he has noted back pain for some time will refer to ortho for further evaluation.  Call if he needs anything in the meantime, Will plan further follow- up pending labs.   Abbe Amsterdam, MD

## 2012-03-04 NOTE — Progress Notes (Signed)
Patient ID: Fernando Saunders, male   DOB: [birth year], 71 y.o.   MRN: [id number]  Chief complaint:  Heartburn, abdominal pain  History present illness:  The patient returns reporting he has not had any problems with the periumbilical abdominal pain and heartburn since using Prilosec OTC 20 mg daily. I should say that he had one spell of regurgitation the last 10 days, as he had stopped taking the Prilosec. He is here with his daughter today, she did not realize it stopped it. He had months to years of symptoms prior to starting the procedure OTC in the spring. He had a negative screening colonoscopy in the intervening time. Between visits. He reports no other problems today.Medications, allergies, past medical history, past surgical history, family history and social history are reviewed and updated in the EMR.  Physical exam:  Well-developed elderly Hispanic man in no acute distress  Assessment and plan:  1. GERD (gastroesophageal reflux disease)    1. He is doing well. It sounds reasonable to continue Prilosec indefinitely. He will do so and see me as needed.

## 2012-03-05 ENCOUNTER — Encounter: Payer: Self-pay | Admitting: Family Medicine

## 2012-03-11 DIAGNOSIS — M545 Low back pain, unspecified: Secondary | ICD-10-CM | POA: Diagnosis not present

## 2012-03-12 DIAGNOSIS — M545 Low back pain, unspecified: Secondary | ICD-10-CM | POA: Diagnosis not present

## 2012-03-19 DIAGNOSIS — M545 Low back pain, unspecified: Secondary | ICD-10-CM | POA: Diagnosis not present

## 2012-03-30 ENCOUNTER — Encounter: Payer: Self-pay | Admitting: Family Medicine

## 2012-03-30 DIAGNOSIS — M549 Dorsalgia, unspecified: Secondary | ICD-10-CM | POA: Insufficient documentation

## 2012-04-16 ENCOUNTER — Ambulatory Visit: Payer: Medicare Other

## 2012-05-05 DIAGNOSIS — M545 Low back pain, unspecified: Secondary | ICD-10-CM | POA: Diagnosis not present

## 2012-05-06 ENCOUNTER — Other Ambulatory Visit: Payer: Self-pay | Admitting: Orthopedic Surgery

## 2012-05-06 DIAGNOSIS — M545 Low back pain, unspecified: Secondary | ICD-10-CM | POA: Diagnosis not present

## 2012-05-07 ENCOUNTER — Ambulatory Visit
Admission: RE | Admit: 2012-05-07 | Discharge: 2012-05-07 | Disposition: A | Discharge status: Home / Self Care | Payer: Medicare Other | Source: Ambulatory Visit | Attending: Orthopedic Surgery | Admitting: Orthopedic Surgery

## 2012-05-07 DIAGNOSIS — M545 Low back pain, unspecified: Secondary | ICD-10-CM

## 2012-05-07 DIAGNOSIS — N400 Enlarged prostate without lower urinary tract symptoms: Secondary | ICD-10-CM | POA: Diagnosis not present

## 2012-06-02 DIAGNOSIS — M545 Low back pain, unspecified: Secondary | ICD-10-CM | POA: Diagnosis not present

## 2013-02-11 DIAGNOSIS — Z125 Encounter for screening for malignant neoplasm of prostate: Secondary | ICD-10-CM | POA: Diagnosis not present

## 2013-02-12 DIAGNOSIS — N401 Enlarged prostate with lower urinary tract symptoms: Secondary | ICD-10-CM | POA: Diagnosis not present

## 2013-03-26 DIAGNOSIS — N401 Enlarged prostate with lower urinary tract symptoms: Secondary | ICD-10-CM | POA: Diagnosis not present

## 2013-06-28 DIAGNOSIS — N139 Obstructive and reflux uropathy, unspecified: Secondary | ICD-10-CM | POA: Diagnosis not present

## 2013-06-28 DIAGNOSIS — N401 Enlarged prostate with lower urinary tract symptoms: Secondary | ICD-10-CM | POA: Diagnosis not present

## 2013-09-20 DIAGNOSIS — H2510 Age-related nuclear cataract, unspecified eye: Secondary | ICD-10-CM | POA: Diagnosis not present

## 2013-09-20 DIAGNOSIS — H18419 Arcus senilis, unspecified eye: Secondary | ICD-10-CM | POA: Diagnosis not present

## 2013-09-20 DIAGNOSIS — H11159 Pinguecula, unspecified eye: Secondary | ICD-10-CM | POA: Diagnosis not present

## 2013-09-20 DIAGNOSIS — H25019 Cortical age-related cataract, unspecified eye: Secondary | ICD-10-CM | POA: Diagnosis not present

## 2013-10-13 DIAGNOSIS — H2510 Age-related nuclear cataract, unspecified eye: Secondary | ICD-10-CM | POA: Diagnosis not present

## 2013-10-13 DIAGNOSIS — H269 Unspecified cataract: Secondary | ICD-10-CM | POA: Diagnosis not present

## 2013-11-02 DIAGNOSIS — H2510 Age-related nuclear cataract, unspecified eye: Secondary | ICD-10-CM | POA: Diagnosis not present

## 2013-11-02 DIAGNOSIS — H269 Unspecified cataract: Secondary | ICD-10-CM | POA: Diagnosis not present

## 2013-11-12 ENCOUNTER — Ambulatory Visit (INDEPENDENT_AMBULATORY_CARE_PROVIDER_SITE_OTHER): Payer: Medicare Other | Admitting: Family Medicine

## 2013-11-12 ENCOUNTER — Ambulatory Visit: Payer: Medicare Other

## 2013-11-12 VITALS — BP 126/72 | HR 63 | Temp 97.4°F | Resp 16 | Ht 69.5 in | Wt 190.8 lb

## 2013-11-12 DIAGNOSIS — Z23 Encounter for immunization: Secondary | ICD-10-CM

## 2013-11-12 DIAGNOSIS — R079 Chest pain, unspecified: Secondary | ICD-10-CM

## 2013-11-12 DIAGNOSIS — IMO0001 Reserved for inherently not codable concepts without codable children: Secondary | ICD-10-CM

## 2013-11-12 DIAGNOSIS — R030 Elevated blood-pressure reading, without diagnosis of hypertension: Secondary | ICD-10-CM | POA: Diagnosis not present

## 2013-11-12 DIAGNOSIS — E785 Hyperlipidemia, unspecified: Secondary | ICD-10-CM

## 2013-11-12 DIAGNOSIS — E7800 Pure hypercholesterolemia, unspecified: Secondary | ICD-10-CM | POA: Diagnosis not present

## 2013-11-12 LAB — COMPREHENSIVE METABOLIC PANEL
ALT: 17 U/L (ref 0–53)
AST: 20 U/L (ref 0–37)
Albumin: 4.6 g/dL (ref 3.5–5.2)
Alkaline Phosphatase: 79 U/L (ref 39–117)
BUN: 15 mg/dL (ref 6–23)
CO2: 25 mEq/L (ref 19–32)
Calcium: 9.9 mg/dL (ref 8.4–10.5)
Chloride: 103 mEq/L (ref 96–112)
Creat: 1.04 mg/dL (ref 0.50–1.35)
Glucose, Bld: 85 mg/dL (ref 70–99)
Potassium: 4.3 mEq/L (ref 3.5–5.3)
Sodium: 142 mEq/L (ref 135–145)
Total Bilirubin: 0.8 mg/dL (ref 0.2–1.2)
Total Protein: 7.5 g/dL (ref 6.0–8.3)

## 2013-11-12 LAB — CBC
HCT: 43.5 % (ref 39.0–52.0)
Hemoglobin: 14.6 g/dL (ref 13.0–17.0)
MCH: 31.7 pg (ref 26.0–34.0)
MCHC: 33.6 g/dL (ref 30.0–36.0)
MCV: 94.6 fL (ref 78.0–100.0)
Platelets: 286 10*3/uL (ref 150–400)
RBC: 4.6 MIL/uL (ref 4.22–5.81)
RDW: 12.7 % (ref 11.5–15.5)
WBC: 7.5 10*3/uL (ref 4.0–10.5)

## 2013-11-12 LAB — LIPID PANEL
Cholesterol: 200 mg/dL (ref 0–200)
HDL: 34 mg/dL — ABNORMAL LOW (ref >39)
LDL Cholesterol: 127 mg/dL — ABNORMAL HIGH (ref 0–99)
Total CHOL/HDL Ratio: 5.9 Ratio
Triglycerides: 193 mg/dL — ABNORMAL HIGH (ref <150)
VLDL: 39 mg/dL (ref 0–40)

## 2013-11-12 MED ORDER — PRAVASTATIN SODIUM 40 MG PO TABS: 40.0000 mg | ORAL_TABLET | Freq: Every day | ORAL | Status: DC

## 2013-11-12 NOTE — Patient Instructions (Addendum)
I will be in touch with your labs and will set you up to see cardiology- we will try to have you see Dr. Tenny Crawoss, but I am not sure if she is taking new patients now.    Please let me know if you have any other problems or changes in your symptoms.    You can get the shingles vaccine at your convenience at many drug stores; wait a month after your pneumonia vaccine today

## 2013-11-12 NOTE — Progress Notes (Signed)
[facility] [address], [place] [address] [phone]  Date:  11/12/2013   Name:  Fernando ShawlRafael Karel   DOB:  [birth year]   MRN:  [id number]  PCP:  Abbe AmsterdamOPLAND,Zamyiah Tino, MD    Chief Complaint: Annual Exam, Hypertension and Foot Swelling   History of Present Illness:  Fernando Saunders is a 73 y.o. very pleasant male patient who presents with the following:  Last seen by myself about 2 years ago. Here today seeking a CPE- however he actually had several other concerns today so converted to a PE.    He had cataracts removed recently. 1st eye in April, 2nd eye done on approx 5/5.   At most recent pre-op he was told that his BP was high.  Was told that this heart rate was "irregular" on the pre-op EKG.  He was given his EKG but does not have it with him now, not sure where he put it.    [facility] [address]- his opthal surgeon was Dr. Delaney MeigsStonecipher He is fasting today for labs He also has history of left chest pain when he raises his left arm.  He has noted this for a long time- maybe 5 months. He has this pain with movement of his arm sometimes only, not all the time.  No exertional pain  He also has noted swelling and pain in his legs for about one week.  He notes pain "in the bone" of his shins bilaterally  He is here with his daughter today who aids with interpretation    Patient Active Problem List   Diagnosis Date Noted  . Back pain 03/30/2012  . GERD (gastroesophageal reflux disease) 10/29/2011    Past Medical History  Diagnosis Date  . Arthritis   . Hyperlipidemia   . Diverticulosis   . GERD (gastroesophageal reflux disease)     Past Surgical History  Procedure Laterality Date  . Colonoscopy  11/04/2011    Dr. Stan Headarl Gessner  . Eye surgery      History  Substance Use Topics  . Smoking status: Never Smoker   . Smokeless tobacco: Never Used  . Alcohol Use: Yes     Comment: occasional    Family History  Problem  Relation Age of Onset  . Colon cancer Neg Hx   . Stomach cancer Neg Hx   . Diabetes Mother     No Known Allergies  Medication list has been reviewed and updated.  Physical Examination: Filed Vitals:   11/12/13 1248  BP: 126/72  Pulse: 63  Temp: 97.4 F (36.3 C)  Resp: 16   Filed Vitals:   11/12/13 1248  Height: 5' 9.5" (1.765 m)  Weight: 190 lb 12.8 oz (86.546 kg)   Body mass index is 27.78 kg/(m^2). Ideal Body Weight: Weight in (lb) to have BMI = 25: 171.4  GEN: WDWN, NAD, Non-toxic, A & O x 3, looks well HEENT: Atraumatic, Normocephalic. Neck supple. No masses, No LAD.  Bilateral TM wnl, oropharynx normal.  PEERL,EOMI.   Ears and Nose: No external deformity. CV: RRR, No M/G/R. No JVD. No thrill. No extra heart sounds. PULM: CTA B, no wheezes, crackles, rhonchi. No retractions. No resp. distress. No accessory muscle use. ABD: S, NT, ND, +BS. No rebound. No HSM. EXTR: No c/c/e.  There is no appreciable edema of the LE.  He notes that he feels sore over the bilateral tibas, but there is no swelling, tenderness or cords  in either calf.  No swelling or redness over anterior shins NEURO Normal gait.  PSYCH: Normally interactive. Conversant. Not depressed or anxious appearing.  Calm demeanor.  I am able to reproduce CP by pressing on the left chest wall.    EKG:  Sinus bradycardia, no ST elevation or depression.  Compared to old EKG from 2012- no significant change, he was bradycardic then as well  [facility] reading (PRIMARY) by  Dr. Patsy Lageropland. CXR: negative  CHEST 2 VIEW  COMPARISON: None.  FINDINGS: There are are nodular opacities in each lung base, upright representing nipple shadows. There is subsegmental atelectasis in the left base. Elsewhere lungs are clear. Heart size and pulmonary vascularity are normal. No adenopathy. No bone lesions.  IMPRESSION: Probable nipple shadows bilaterally. Advise repeat film with nipple markers to confirm. Mild left base atelectasis.  Lungs otherwise clear.  Assessment and Plan: Other and unspecified hyperlipidemia - Plan: CBC, Lipid panel  Elevated blood pressure - Plan: Comprehensive metabolic panel, EKG 12-Lead  Chest pain - Plan: EKG 12-Lead, DG Chest 2 View, Ambulatory referral to Cardiology  Immunization due - Plan: Pneumococcal conjugate vaccine 13-valent IM  High cholesterol - Plan: pravastatin (PRAVACHOL) 40 MG tablet  73 year old make here today to follow-up on possible HTN and "irregular heart beat" prior to eye surgery.  At this point his EKG is reassuring, shows sinus bradycardia.  He does have CP, but it is reproducible/ atypical. Likely MSK, but will refer to cardiology due to his age. Dr. Tenny Crawoss cares for a family member and he would like to see her if possible.    Gave prevnar today, rx for zostavax  Will plan further follow- up pending labs.    Signed Abbe AmsterdamJessica Treg Diemer, MD

## 2013-11-13 ENCOUNTER — Encounter: Payer: Self-pay | Admitting: Family Medicine

## 2013-11-18 DIAGNOSIS — N401 Enlarged prostate with lower urinary tract symptoms: Secondary | ICD-10-CM | POA: Diagnosis not present

## 2013-11-18 DIAGNOSIS — N139 Obstructive and reflux uropathy, unspecified: Secondary | ICD-10-CM | POA: Diagnosis not present

## 2014-01-10 ENCOUNTER — Encounter: Payer: Medicare Other | Admitting: Internal Medicine

## 2014-01-10 NOTE — Progress Notes (Signed)
HPI  No Known Allergies  Current Outpatient Prescriptions  Medication Sig Dispense Refill  . aspirin 81 MG tablet Take 81 mg by mouth daily.      . Multiple Vitamin (MULTIVITAMIN) tablet Take 1 tablet by mouth daily.      Marland Kitchen. omeprazole (PRILOSEC OTC) 20 MG tablet Take 1 tablet (20 mg total) by mouth daily.  42 tablet  3  . pravastatin (PRAVACHOL) 40 MG tablet Take 1 tablet (40 mg total) by mouth daily.  90 tablet  3   No current facility-administered medications for this visit.    Past Medical History  Diagnosis Date  . Arthritis   . Hyperlipidemia   . Diverticulosis   . GERD (gastroesophageal reflux disease)     Past Surgical History  Procedure Laterality Date  . Colonoscopy  11/04/2011    Dr. Stan Headarl Gessner  . Eye surgery      Family History  Problem Relation Age of Onset  . Colon cancer Neg Hx   . Stomach cancer Neg Hx   . Diabetes Mother     History   Social History  . Marital Status: Married    Spouse Name: N/A    Number of Children: 2  . Years of Education: N/A   Occupational History  . Unemployed    Social History Main Topics  . Smoking status: Never Smoker   . Smokeless tobacco: Never Used  . Alcohol Use: Yes     Comment: occasional  . Drug Use: No  . Sexual Activity: Not on file   Other Topics Concern  . Not on file   Social History Narrative  . No narrative on file    Review of Systems:  All systems reviewed.  They are negative to the above problem except as previously stated.  Vital Signs: There were no vitals taken for this visit.  Physical Exam  HEENT:  Normocephalic, atraumatic. EOMI, PERRLA.  Neck: JVP is normal.  No bruits.  Lungs: clear to auscultation. No rales no wheezes.  Heart: Regular rate and rhythm. Normal S1, S2. No S3.   No significant murmurs. PMI not displaced.  Abdomen:  Supple, nontender. Normal bowel sounds. No masses. No hepatomegaly.  Extremities:   Good distal pulses throughout. No lower extremity edema.   Musculoskeletal :moving all extremities.  Neuro:   alert and oriented x3.  CN II-XII grossly intact.   Assessment and Plan:    This encounter was created in error - please disregard.

## 2014-02-14 ENCOUNTER — Encounter: Payer: Self-pay | Admitting: Internal Medicine

## 2014-02-14 ENCOUNTER — Ambulatory Visit (INDEPENDENT_AMBULATORY_CARE_PROVIDER_SITE_OTHER): Payer: Medicare Other | Admitting: Internal Medicine

## 2014-02-14 VITALS — BP 130/91 | HR 58 | Ht 71.0 in | Wt 192.0 lb

## 2014-02-14 DIAGNOSIS — E785 Hyperlipidemia, unspecified: Secondary | ICD-10-CM | POA: Diagnosis not present

## 2014-02-14 DIAGNOSIS — R079 Chest pain, unspecified: Secondary | ICD-10-CM

## 2014-02-14 MED ORDER — PRAVASTATIN SODIUM 40 MG PO TABS: 40.0000 mg | ORAL_TABLET | Freq: Every evening | ORAL | Status: DC

## 2014-02-14 NOTE — Patient Instructions (Signed)
Your physician has requested that you have en exercise stress myoview. For further information please visit [email or web address]. Please follow instruction sheet, as given.  Your physician has recommended you make the following change in your medication:  1.) begin pravastatin 40 mg once daily  Your physician recommends that you return for lab work in: 8 weeks (April 11, 2014) --(LIPID, LIVER FUNCTION)

## 2014-02-14 NOTE — Progress Notes (Signed)
HPI Patient is a 73 yo who is referred for evaluation of CP Difficult history as patient does not speak [place], used interpreter. History of L sided CP  At times severe.  Not definitely associated with activity  Not related to food.  Says he think it is better since chol is down. No Known Allergies  Current Outpatient Prescriptions  Medication Sig Dispense Refill  . aspirin 81 MG tablet Take 81 mg by mouth daily.      . Multiple Vitamin (MULTIVITAMIN) tablet Take 1 tablet by mouth daily.      Marland Kitchen. omeprazole (PRILOSEC OTC) 20 MG tablet Take 1 tablet (20 mg total) by mouth daily.  42 tablet  3   No current facility-administered medications for this visit.    Past Medical History  Diagnosis Date  . Arthritis   . Hyperlipidemia   . Diverticulosis   . GERD (gastroesophageal reflux disease)     Past Surgical History  Procedure Laterality Date  . Colonoscopy  11/04/2011    Dr. Stan Headarl Gessner  . Eye surgery      Family History  Problem Relation Age of Onset  . Colon cancer Neg Hx   . Stomach cancer Neg Hx   . Diabetes Mother   . Heart attack Neg Hx   . Cancer Sister     History   Social History  . Marital Status: Married    Spouse Name: N/A    Number of Children: 2  . Years of Education: N/A   Occupational History  . Unemployed    Social History Main Topics  . Smoking status: Never Smoker   . Smokeless tobacco: Never Used  . Alcohol Use: Yes     Comment: occasional  . Drug Use: No  . Sexual Activity: Not on file   Other Topics Concern  . Not on file   Social History Narrative  . No narrative on file    Review of Systems:  All systems reviewed.  They are negative to the above problem except as previously stated.  Vital Signs: BP 130/91  Pulse 58  Ht 5\' 11"  (1.803 m)  Wt 192 lb (87.091 kg)  BMI 26.79 kg/m2  Physical Exam Patient is in NAD HEENT:  Normocephalic, atraumatic. EOMI, PERRLA.  Neck: JVP is normal.  No bruits.  Lungs: clear to auscultation. No  rales no wheezes.  Heart: Regular rate and rhythm. Normal S1, S2. No S3.   No significant murmurs. PMI not displaced.  Abdomen:  Supple, nontender. Normal bowel sounds. No masses. No hepatomegaly.  Extremities:   Good distal pulses throughout. No lower extremity edema.  Musculoskeletal :moving all extremities.  Neuro:   alert and oriented x3.  CN II-XII grossly intact.  EKG  5/15  Sinus bradycardia  50 bpm   Assessment and Plan:  1.  CP  Difficult history  Reviewed records patient had a CT scan a couple years ago that showed atherosclerosis of coronary arteries and aorta.  With this and poor history i would recomm stress myoview to rule out ischemia  2.  HL  Needs to be on a statin  Was started on pravastatin but did not start.  Will make sure he has f/u in future  Immed f/u based on test results.

## 2014-02-17 ENCOUNTER — Ambulatory Visit (HOSPITAL_COMMUNITY): Payer: Medicare Other | Attending: Cardiovascular Disease | Admitting: Radiology

## 2014-02-17 VITALS — BP 144/81 | HR 53 | Ht 71.0 in | Wt 188.0 lb

## 2014-02-17 DIAGNOSIS — E785 Hyperlipidemia, unspecified: Secondary | ICD-10-CM

## 2014-02-17 DIAGNOSIS — R079 Chest pain, unspecified: Secondary | ICD-10-CM | POA: Diagnosis not present

## 2014-02-17 MED ORDER — TECHNETIUM TC 99M SESTAMIBI GENERIC - CARDIOLITE
30.0000 | Freq: Once | INTRAVENOUS | Status: AC | PRN
  Administered 2014-02-17: 30 via INTRAVENOUS

## 2014-02-17 MED ORDER — TECHNETIUM TC 99M SESTAMIBI GENERIC - CARDIOLITE
10.0000 | Freq: Once | INTRAVENOUS | Status: AC | PRN
  Administered 2014-02-17: 10 via INTRAVENOUS

## 2014-02-17 NOTE — Progress Notes (Signed)
[facility] SITE 3 NUCLEAR MED [address] [place], [place] [address] [phone]    Cardiology Nuclear Med Study  Fernando ShawlRafael Saunders is a 73 y.o. male     MRN : [id number]     DOB: 08/19/1940  Procedure Date: 02/17/2014  Nuclear Med Background Indication for Stress Test:  Evaluation for Ischemia History:  No known CAD Cardiac Risk Factors: Lipids  Symptoms:  Chest Pain (last date of chest discomfort was 2-3 weeks ago)   Nuclear Pre-Procedure Caffeine/Decaff Intake:  None> 12 hrs NPO After: 8:00pm   Lungs:  clear O2 Sat: 96% on room air. IV 0.9% NS with Angio Cath:  22g  IV Site: R Antecubital x 1, tolerated well IV Started by:  Irean HongPatsy Edwards, RN  Chest Size (in):  42 Cup Size: n/a  Height: 5\' 11"  (1.803 m)  Weight:  188 lb (85.276 kg)  BMI:  Body mass index is 26.23 kg/(m^2). Tech Comments:  N/A    Nuclear Med Study 1 or 2 day study: 1 day  Stress Test Type:  Stress  Reading MD: N/A  Order Authorizing Provider:  Dietrich PatesPaula Shylee Durrett, MD  Resting Radionuclide: Technetium 6740m Sestamibi  Resting Radionuclide Dose: 11.0 mCi   Stress Radionuclide:  Technetium 8040m Sestamibi  Stress Radionuclide Dose: 33.0 mCi           Stress Protocol Rest HR: 53 Stress HR: 141  Rest BP: 144/81 Stress BP: 181/80  Exercise Time (min): 10:00 METS: 11.7           Dose of Adenosine (mg):  n/a Dose of Lexiscan: n/a mg  Dose of Atropine (mg): n/a Dose of Dobutamine: n/a mcg/kg/min (at max HR)  Stress Test Technologist: Nelson ChimesSharon Brooks, BS-ES  Nuclear Technologist:  Harlow AsaElizabeth Young, CNMT     Rest Procedure:  Myocardial perfusion imaging was performed at rest 45 minutes following the intravenous administration of Technetium 7040m Sestamibi. Rest ECG: NSR - Normal EKG  Stress Procedure:  The patient exercised on the treadmill utilizing the Bruce Protocol for 10:00 minutes. The patient stopped due to fatigue and denied any chest pain.  Technetium 7540m Sestamibi was injected at peak exercise and  myocardial perfusion imaging was performed after a brief delay. Stress ECG: No significant change from baseline ECG  QPS Raw Data Images:  Soft tissue (diaphragm, bowel actvity) underlie heart.   Stress Images:  Thinning with decreased tracer actvity in the inferior wall (base) inferolateral wall (base, mid).  Otherwise normal perfusion.   Rest Images:  Comparison with the stress images reveals no significant change. Subtraction (SDS):  No evidence of ischemia. Transient Ischemic Dilatation (Normal <1.22):  0.93 Lung/Heart Ratio (Normal <0.45):  0.43  Quantitative Gated Spect Images QGS EDV:  104 ml QGS ESV:  55 ml  Impression Exercise Capacity:  Excellent exercise capacity. BP Response:  Normal blood pressure response. Clinical Symptoms:  No chest pain. ECG Impression:  No significant ST segment change suggestive of ischemia. Comparison with Prior Nuclear Study: NO prior study.   Overall Impression:  Normal stress nuclear study.  LV Ejection Fraction: 47%.  LV Wall Motion:  Normal wall thickening.   Consider other imaging modality to further define LVEF.  Dietrich PatesPaula Justice Milliron

## 2014-02-23 ENCOUNTER — Other Ambulatory Visit: Payer: Self-pay | Admitting: *Deleted

## 2014-02-23 ENCOUNTER — Telehealth: Payer: Self-pay | Admitting: *Deleted

## 2014-02-23 DIAGNOSIS — N401 Enlarged prostate with lower urinary tract symptoms: Secondary | ICD-10-CM | POA: Diagnosis not present

## 2014-02-23 DIAGNOSIS — R079 Chest pain, unspecified: Secondary | ICD-10-CM

## 2014-02-23 NOTE — Telephone Encounter (Signed)
Message copied by Harriet Butte on Wed Feb 23, 2014  4:47 PM ------      Message from: MCVEY, LINDA K      Created: Wed Feb 23, 2014  3:06 PM      Contact: [phone]       Echo scheduled for 02/28/2014 at 10:30      Thanks  Bonita Quin            ----- Message -----         From: Harriet Butte, RN         Sent: 02/23/2014   2:13 PM           To: Lendon Ka, RN, [facility]            Pt needs to have an echo scheduled. Order is in Epic. Pt speaks spanish only. If you scheduled it today I can call him to let him know the date and time.       Thanks . Nivida.       ------

## 2014-02-23 NOTE — Telephone Encounter (Signed)
Pt is aware of appointment for echo on 02/28/14 at 10:30 AM pt is aware.

## 2014-02-28 ENCOUNTER — Ambulatory Visit (HOSPITAL_COMMUNITY): Payer: Medicare Other | Attending: Internal Medicine

## 2014-02-28 DIAGNOSIS — M129 Arthropathy, unspecified: Secondary | ICD-10-CM | POA: Diagnosis not present

## 2014-02-28 DIAGNOSIS — K5730 Diverticulosis of large intestine without perforation or abscess without bleeding: Secondary | ICD-10-CM | POA: Insufficient documentation

## 2014-02-28 DIAGNOSIS — I059 Rheumatic mitral valve disease, unspecified: Secondary | ICD-10-CM | POA: Insufficient documentation

## 2014-02-28 DIAGNOSIS — R079 Chest pain, unspecified: Secondary | ICD-10-CM

## 2014-02-28 DIAGNOSIS — I359 Nonrheumatic aortic valve disorder, unspecified: Secondary | ICD-10-CM | POA: Insufficient documentation

## 2014-02-28 DIAGNOSIS — R072 Precordial pain: Secondary | ICD-10-CM | POA: Diagnosis not present

## 2014-02-28 DIAGNOSIS — K219 Gastro-esophageal reflux disease without esophagitis: Secondary | ICD-10-CM | POA: Diagnosis not present

## 2014-02-28 DIAGNOSIS — E785 Hyperlipidemia, unspecified: Secondary | ICD-10-CM | POA: Diagnosis not present

## 2014-02-28 NOTE — Progress Notes (Signed)
2D Echo completed. 02/28/2014 

## 2014-03-10 ENCOUNTER — Telehealth: Payer: Self-pay | Admitting: *Deleted

## 2014-03-10 NOTE — Telephone Encounter (Signed)
Pt's daughter  is aware of stress rest result.

## 2014-03-11 ENCOUNTER — Telehealth: Payer: Self-pay | Admitting: *Deleted

## 2014-03-11 NOTE — Telephone Encounter (Signed)
Pt daughter aware of results  

## 2014-03-11 NOTE — Telephone Encounter (Signed)
Message copied by Harriet Butte on Fri Mar 11, 2014  8:58 AM ------      Message from: Lendon Ka      Created: Thu Mar 03, 2014  1:36 PM      Regarding: spanish speaking patient--echo results       Hi Harlo Fabela,      I forwarded you Dr. Charlott Rakes comments attached to the echo.  (Review Reports).      Can you call him to inform?      Thank you!      Michalene ------

## 2014-04-11 ENCOUNTER — Other Ambulatory Visit: Payer: Medicare Other

## 2014-06-13 ENCOUNTER — Telehealth: Payer: Self-pay

## 2014-06-13 ENCOUNTER — Ambulatory Visit: Payer: Medicare Other | Admitting: Family Medicine

## 2014-06-13 NOTE — Telephone Encounter (Signed)
Left message for patient to have his flu shot. 

## 2014-06-30 ENCOUNTER — Encounter: Payer: Self-pay | Admitting: Internal Medicine

## 2014-07-05 DIAGNOSIS — H11003 Unspecified pterygium of eye, bilateral: Secondary | ICD-10-CM | POA: Diagnosis not present

## 2014-08-08 ENCOUNTER — Encounter: Payer: Self-pay | Admitting: Family Medicine

## 2014-08-08 ENCOUNTER — Ambulatory Visit (INDEPENDENT_AMBULATORY_CARE_PROVIDER_SITE_OTHER): Payer: Medicare Other | Admitting: Family Medicine

## 2014-08-08 ENCOUNTER — Ambulatory Visit (INDEPENDENT_AMBULATORY_CARE_PROVIDER_SITE_OTHER): Payer: Medicare Other

## 2014-08-08 VITALS — BP 152/90 | HR 60 | Temp 97.5°F | Resp 16 | Ht 71.5 in | Wt 196.0 lb

## 2014-08-08 DIAGNOSIS — R0789 Other chest pain: Secondary | ICD-10-CM

## 2014-08-08 DIAGNOSIS — R938 Abnormal findings on diagnostic imaging of other specified body structures: Secondary | ICD-10-CM

## 2014-08-08 DIAGNOSIS — Z0000 Encounter for general adult medical examination without abnormal findings: Secondary | ICD-10-CM

## 2014-08-08 DIAGNOSIS — L989 Disorder of the skin and subcutaneous tissue, unspecified: Secondary | ICD-10-CM

## 2014-08-08 DIAGNOSIS — Z23 Encounter for immunization: Secondary | ICD-10-CM | POA: Diagnosis not present

## 2014-08-08 DIAGNOSIS — Z125 Encounter for screening for malignant neoplasm of prostate: Secondary | ICD-10-CM

## 2014-08-08 DIAGNOSIS — I10 Essential (primary) hypertension: Secondary | ICD-10-CM | POA: Diagnosis not present

## 2014-08-08 DIAGNOSIS — R9389 Abnormal findings on diagnostic imaging of other specified body structures: Secondary | ICD-10-CM

## 2014-08-08 DIAGNOSIS — E785 Hyperlipidemia, unspecified: Secondary | ICD-10-CM | POA: Diagnosis not present

## 2014-08-08 DIAGNOSIS — Z131 Encounter for screening for diabetes mellitus: Secondary | ICD-10-CM

## 2014-08-08 DIAGNOSIS — R079 Chest pain, unspecified: Secondary | ICD-10-CM | POA: Insufficient documentation

## 2014-08-08 LAB — LIPID PANEL
Cholesterol: 161 mg/dL (ref 0–200)
HDL: 35 mg/dL — ABNORMAL LOW (ref >39)
LDL Cholesterol: 82 mg/dL (ref 0–99)
Total CHOL/HDL Ratio: 4.6 Ratio
Triglycerides: 219 mg/dL — ABNORMAL HIGH (ref <150)
VLDL: 44 mg/dL — ABNORMAL HIGH (ref 0–40)

## 2014-08-08 LAB — CBC
HCT: 42.6 % (ref 39.0–52.0)
Hemoglobin: 13.9 g/dL (ref 13.0–17.0)
MCH: 31.9 pg (ref 26.0–34.0)
MCHC: 32.6 g/dL (ref 30.0–36.0)
MCV: 97.7 fL (ref 78.0–100.0)
MPV: 11.4 fL (ref 8.6–12.4)
Platelets: 256 10*3/uL (ref 150–400)
RBC: 4.36 MIL/uL (ref 4.22–5.81)
RDW: 12.3 % (ref 11.5–15.5)
WBC: 7 10*3/uL (ref 4.0–10.5)

## 2014-08-08 LAB — COMPREHENSIVE METABOLIC PANEL
ALT: 16 U/L (ref 0–53)
AST: 17 U/L (ref 0–37)
Albumin: 4.1 g/dL (ref 3.5–5.2)
Alkaline Phosphatase: 68 U/L (ref 39–117)
BUN: 17 mg/dL (ref 6–23)
CO2: 26 mEq/L (ref 19–32)
Calcium: 8.8 mg/dL (ref 8.4–10.5)
Chloride: 103 mEq/L (ref 96–112)
Creat: 1.08 mg/dL (ref 0.50–1.35)
Glucose, Bld: 96 mg/dL (ref 70–99)
Potassium: 3.9 mEq/L (ref 3.5–5.3)
Sodium: 141 mEq/L (ref 135–145)
Total Bilirubin: 0.6 mg/dL (ref 0.2–1.2)
Total Protein: 7.1 g/dL (ref 6.0–8.3)

## 2014-08-08 LAB — PSA, MEDICARE: PSA: 1.13 ng/mL (ref <=4.00)

## 2014-08-08 MED ORDER — LISINOPRIL 5 MG PO TABS: 5.0000 mg | ORAL_TABLET | Freq: Every day | ORAL | Status: DC

## 2014-08-08 MED ORDER — PRAVASTATIN SODIUM 40 MG PO TABS: 40.0000 mg | ORAL_TABLET | Freq: Every evening | ORAL | Status: DC

## 2014-08-08 NOTE — Progress Notes (Signed)
[facility] [address], [place] [address] [phone]  Date:  08/08/2014   Name:  Fernando Saunders   DOB:  February 13, 1941   MRN:  [id number]  PCP:  Abbe Amsterdam, MD    Chief Complaint: Annual Exam   History of Present Illness:  Fernando Saunders is a 74 y.o. very pleasant male patient who presents with the following:  Here today for a CPE.  Last seen by myself in May of 2015.  He was noted to have dyslipidemia, but OW labs were normal at that time.   He has had tdap, prevnar and penumovax.  He would like a flu shot today Colonoscopy is UTD He is fasting today for labs   He has not taken his cholesterol medication for about one month. Reports that he has been told that his BP was high on several occasions in the past but he has never been treated for same  He also has complaint of a skin lesion on his left forearm and of coughing up yellow phlegm in the morning for over a year, no fever.  He thinks this is due to his "sinuses."    BP Readings from Last 3 Encounters:  08/08/14 144/79  02/17/14 144/81  02/14/14 130/91   Looking back at old CXR, he was supposed to come in for a repeat film with nipple markers but has not yet done so.  Will do this today.    Patient Active Problem List   Diagnosis Date Noted  . Back pain 03/30/2012  . GERD (gastroesophageal reflux disease) 10/29/2011    Past Medical History  Diagnosis Date  . Arthritis   . Hyperlipidemia   . Diverticulosis   . GERD (gastroesophageal reflux disease)     Past Surgical History  Procedure Laterality Date  . Colonoscopy  11/04/2011    Dr. Stan Head  . Eye surgery      History  Substance Use Topics  . Smoking status: Never Smoker   . Smokeless tobacco: Never Used  . Alcohol Use: Yes     Comment: occasional    Family History  Problem Relation Age of Onset  . Colon cancer Neg Hx   . Stomach cancer Neg Hx   . Diabetes Mother   . Heart attack Neg Hx   . Cancer Sister      No Known Allergies  Medication list has been reviewed and updated.  Current Outpatient Prescriptions on File Prior to Visit  Medication Sig Dispense Refill  . aspirin 81 MG tablet Take 81 mg by mouth daily.    . Multiple Vitamin (MULTIVITAMIN) tablet Take 1 tablet by mouth daily.    . pravastatin (PRAVACHOL) 40 MG tablet Take 1 tablet (40 mg total) by mouth every evening. 90 tablet 3  . omeprazole (PRILOSEC OTC) 20 MG tablet Take 1 tablet (20 mg total) by mouth daily. 42 tablet 3   No current facility-administered medications on file prior to visit.    Review of Systems:  As per HPI- otherwise negative.   Physical Examination: Filed Vitals:   08/08/14 0952  BP: 144/79  Pulse: 60  Temp: 97.5 F (36.4 C)  Resp: 16   Filed Vitals:   08/08/14 0952  Height: 5' 11.5" (1.816 m)  Weight: 196 lb (88.905 kg)   Body mass index is 26.96 kg/(m^2). Ideal Body Weight: Weight in (lb) to have BMI = 25: 181.4  GEN: WDWN, NAD, Non-toxic, A & O x 3, looks well HEENT: Atraumatic,  Normocephalic. Neck supple. No masses, No LAD.  Bilateral TM wnl, oropharynx normal.  PEERL,EOMI.  Ears and Nose: No external deformity. CV: RRR, No M/G/R. No JVD. No thrill. No extra heart sounds. PULM: CTA B, no wheezes, crackles, rhonchi. No retractions. No resp. distress. No accessory muscle use. ABD: S, NT, ND, +BS. No rebound. No HSM. EXTR: No c/c/e NEURO Normal gait.  PSYCH: Normally interactive. Conversant. Not depressed or anxious appearing.  Calm demeanor.  Normal testes/ scrotum, penis, and DRE today.  Normal prostate exam Small scaly skin lesion left forearm which he states has been present for about 2 weeks   [facility] reading (PRIMARY) by  Dr. Patsy Lageropland. CXR done today for nipple markers.  Appears normal.  Please compare to CXR 11/12/2013.   CHEST 2 VIEW  COMPARISON: 11/12/2013  FINDINGS: The previously noted nodular densities correspond to the nipple markers and nipple shadows. No  suspicious pulmonary nodules. Heart is normal size. No effusions. No acute bony abnormality.  IMPRESSION: No acute cardiopulmonary disease. Previously seen nodules corresponds nipple markers.  Assessment and Plan: Need for prophylactic vaccination and inoculation against influenza - Plan: Flu Vaccine QUAD 36+ mos IM  Abnormal chest x-ray - Plan: DG Chest 2 View, CBC  Screening for diabetes mellitus - Plan: Comprehensive metabolic panel  Screening for prostate cancer - Plan: PSA, Medicare  Dyslipidemia - Plan: Lipid panel, pravastatin (PRAVACHOL) 40 MG tablet  Other chest pain  Essential hypertension - Plan: lisinopril (PRINIVIL,ZESTRIL) 5 MG tablet  Skin lesion - Plan: Ambulatory referral to Dermatology  Flu shot today, await labs as above He is not taking his cholesterol medication, will see if he needs to start back on this Start lisinopril 5mg  for elevated BP Dermatology referral for lesion on his arm- possible skin cancer CXR is normal, nodules seen before are nipple shadows Will plan further follow- up pending labs.   Signed Abbe AmsterdamJessica Llewellyn Schoenberger, MD

## 2014-08-08 NOTE — Patient Instructions (Addendum)
I will be in touch with your labs asap.  Start back on the pravachol for your cholesterol.  Your blood pressure is borderline high.  Let's start you on lisinopril 5mg  once a day I will set you up with a skin doctor to look at the lesion on your arm Try a daily claritin or zyrtec for your sinus symptoms   Please see me in about 2 months for a BP check

## 2014-09-20 DIAGNOSIS — L859 Epidermal thickening, unspecified: Secondary | ICD-10-CM | POA: Diagnosis not present

## 2014-09-20 DIAGNOSIS — L853 Xerosis cutis: Secondary | ICD-10-CM | POA: Diagnosis not present

## 2014-09-20 DIAGNOSIS — L814 Other melanin hyperpigmentation: Secondary | ICD-10-CM | POA: Diagnosis not present

## 2014-09-20 DIAGNOSIS — B351 Tinea unguium: Secondary | ICD-10-CM | POA: Diagnosis not present

## 2014-10-10 ENCOUNTER — Encounter: Payer: Self-pay | Admitting: Family Medicine

## 2014-10-10 ENCOUNTER — Ambulatory Visit (INDEPENDENT_AMBULATORY_CARE_PROVIDER_SITE_OTHER): Payer: Medicare Other | Admitting: Family Medicine

## 2014-10-10 VITALS — BP 130/70 | HR 61 | Temp 98.4°F | Resp 16 | Ht 70.5 in | Wt 193.0 lb

## 2014-10-10 DIAGNOSIS — Z789 Other specified health status: Secondary | ICD-10-CM | POA: Insufficient documentation

## 2014-10-10 DIAGNOSIS — I10 Essential (primary) hypertension: Secondary | ICD-10-CM | POA: Diagnosis not present

## 2014-10-10 DIAGNOSIS — J302 Other seasonal allergic rhinitis: Secondary | ICD-10-CM | POA: Diagnosis not present

## 2014-10-10 MED ORDER — FLUTICASONE PROPIONATE 50 MCG/ACT NA SUSP: 2.0000 | Freq: Every day | NASAL | Status: DC

## 2014-10-10 NOTE — Patient Instructions (Addendum)
Your blood pressure looks great!  Please let me know if your nose does not get better Let's plan to recheck in 6 months

## 2014-10-10 NOTE — Progress Notes (Signed)
[facility] [address] [place] [address] [phone]  Date:  10/10/2014   Name:  Fernando Saunders   DOB:  1940-10-07   MRN:  [id number]  PCP:  Abbe Amsterdam, MD    Chief Complaint: Follow-up and Sinus Problem   History of Present Illness:  Fernando Saunders is a 74 y.o. very pleasant male patient who presents with the following:  Here today to recheck his BP.  Spanish interpreter present for visit today Last seen by myself in February for a PE.  We started lisinopril  for his elevated BP.  He is taking this regularly and has not noted any SE  He also notes that he has some blood from his right nostril when he blows his nose- he has noted this for 4 days. He does not notice any pain or pressure, but it does burn in his nose.   He is not having any nose bleeds- just blood when he blows his nose.   He does not have any cough or itchy eyes, but he is sneezing a lot.  Sometimes he might sneeze 10 times in a row!   He is using some sort of OTC allergy pill- we think zyrtec.    BP Readings from Last 3 Encounters:  10/10/14 130/70  08/08/14 152/90  02/17/14 144/81     Patient Active Problem List   Diagnosis Date Noted  . Chest pain 08/08/2014  . Back pain 03/30/2012  . GERD (gastroesophageal reflux disease) 10/29/2011    Past Medical History  Diagnosis Date  . Arthritis   . Hyperlipidemia   . Diverticulosis   . GERD (gastroesophageal reflux disease)     Past Surgical History  Procedure Laterality Date  . Colonoscopy  11/04/2011    Dr. Stan Head  . Eye surgery      History  Substance Use Topics  . Smoking status: Never Smoker   . Smokeless tobacco: Never Used  . Alcohol Use: Yes     Comment: occasional    Family History  Problem Relation Age of Onset  . Colon cancer Neg Hx   . Stomach cancer Neg Hx   . Diabetes Mother   . Heart attack Neg Hx   . Cancer Sister     No Known Allergies  Medication list has been reviewed and  updated.  Current Outpatient Prescriptions on File Prior to Visit  Medication Sig Dispense Refill  . lisinopril (PRINIVIL,ZESTRIL) 5 MG tablet Take 1 tablet (5 mg total) by mouth daily. 90 tablet 3  . Multiple Vitamin (MULTIVITAMIN) tablet Take 1 tablet by mouth daily.    . pravastatin (PRAVACHOL) 40 MG tablet Take 1 tablet (40 mg total) by mouth every evening. 90 tablet 3  . aspirin 81 MG tablet Take 81 mg by mouth daily.    Marland Kitchen omeprazole (PRILOSEC OTC) 20 MG tablet Take 1 tablet (20 mg total) by mouth daily. 42 tablet 3   No current facility-administered medications on file prior to visit.    Review of Systems:  As per HPI- otherwise negative.   Physical Examination: Filed Vitals:   10/10/14 0921  BP: 130/70  Pulse: 61  Temp: 98.4 F (36.9 C)  Resp: 16   Filed Vitals:   10/10/14 0921  Height: 5' 10.5" (1.791 m)  Weight: 193 lb (87.544 kg)   Body mass index is 27.29 kg/(m^2). Ideal Body Weight: Weight in (lb) to have BMI = 25: 176.4  GEN: WDWN, NAD, Non-toxic, A & O  x 3, looks well HEENT: Atraumatic, Normocephalic. Neck supple. No masses, No LAD.  Bilateral TM wnl, oropharynx normal.  PEERL,EOMI.  There is an area of raw mucosa in the right anterior nare- likely the site of his bleeding.  OW nose is normal  Ears and Nose: No external deformity. CV: RRR, No M/G/R. No JVD. No thrill. No extra heart sounds. PULM: CTA B, no wheezes, crackles, rhonchi. No retractions. No resp. distress. No accessory muscle use.Marland Kitchen. EXTR: No c/c/e NEURO Normal gait.  PSYCH: Normally interactive. Conversant. Not depressed or anxious appearing.  Calm demeanor.    Assessment and Plan: Essential hypertension  Seasonal allergies - Plan: fluticasone (FLONASE) 50 MCG/ACT nasal spray  Language barrier  BP well controlled with lisinopril. He will continue this medication Plan recheck in 6 months Advised him to use vaseline on his right nare 2-3 x a day for a few days, until bleeding resolves.   Then he can use flonase as needed for allergic rhinitis Appreciate professional interpretation services for this visit today  Signed Abbe AmsterdamJessica Copland, MD

## 2014-10-11 ENCOUNTER — Ambulatory Visit (INDEPENDENT_AMBULATORY_CARE_PROVIDER_SITE_OTHER): Payer: Medicare Other | Admitting: Family Medicine

## 2014-10-11 VITALS — BP 173/89 | HR 60 | Temp 97.8°F | Resp 12 | Ht 70.5 in | Wt 192.5 lb

## 2014-10-11 DIAGNOSIS — H8110 Benign paroxysmal vertigo, unspecified ear: Secondary | ICD-10-CM | POA: Diagnosis not present

## 2014-10-11 DIAGNOSIS — R42 Dizziness and giddiness: Secondary | ICD-10-CM | POA: Diagnosis not present

## 2014-10-11 DIAGNOSIS — H8112 Benign paroxysmal vertigo, left ear: Secondary | ICD-10-CM

## 2014-10-11 LAB — POCT CBC
Granulocyte percent: 63.3 %G (ref 37–80)
HCT, POC: 43.8 % (ref 43.5–53.7)
Hemoglobin: 13.9 g/dL — AB (ref 14.1–18.1)
Lymph, poc: 2.5 (ref 0.6–3.4)
MCH, POC: 30.1 pg (ref 27–31.2)
MCHC: 31.9 g/dL (ref 31.8–35.4)
MCV: 94.5 fL (ref 80–97)
MID (cbc): 0.6 (ref 0–0.9)
MPV: 8.6 fL (ref 0–99.8)
POC Granulocyte: 5.4 (ref 2–6.9)
POC LYMPH PERCENT: 30 %L (ref 10–50)
POC MID %: 6.7 %M (ref 0–12)
Platelet Count, POC: 259 10*3/uL (ref 142–424)
RBC: 4.64 M/uL — AB (ref 4.69–6.13)
RDW, POC: 13 %
WBC: 8.5 10*3/uL (ref 4.6–10.2)

## 2014-10-11 LAB — GLUCOSE, POCT (MANUAL RESULT ENTRY): POC Glucose: 98 mg/dl (ref 70–99)

## 2014-10-11 MED ORDER — MECLIZINE HCL 25 MG PO TABS: ORAL_TABLET | ORAL | Status: DC

## 2014-10-11 NOTE — Progress Notes (Signed)
[facility] [address], [place] [id number] [phone]  Date:  10/11/2014   Name:  Fernando Saunders   DOB:  1941/03/22   MRN:  [id number]  PCP:  Abbe AmsterdamOPLAND,Shyonna Carlin, MD    Chief Complaint: Dizziness   History of Present Illness:  Fernando Saunders is a 74 y.o. very pleasant male patient who presents with the following:  Seen yesterday at appt center for recheck HTN, which seemed to be doing well with medication.  Interviewed pt with interpreter and he did not mention anything about being dizzy He is here today with complaint of dizziness which he states has been present slightly for 5 days, worse last night.    He does not have any headache.  He notes vertigo but also has complaint of being lightheaded and nauseated. He did vomit last night once No tinnitus.  He notes "hot flashes" but no known fever.   When he has the dizziness it may last for 30 minutes- however even in between more dizzy episodes he feels a bit dizzy.  When he changes position it is worse.  Holding still does not make it go away.   He has not noted any numbness or weakness in his limbs or body, no numbness in his face  He has never had this in the past  BP Readings from Last 3 Encounters:  10/11/14 142/80  10/10/14 130/70  08/08/14 152/90     Patient Active Problem List   Diagnosis Date Noted  . Seasonal allergies 10/10/2014  . Essential hypertension 10/10/2014  . Language barrier 10/10/2014  . Chest pain 08/08/2014  . Back pain 03/30/2012  . GERD (gastroesophageal reflux disease) 10/29/2011    Past Medical History  Diagnosis Date  . Arthritis   . Hyperlipidemia   . Diverticulosis   . GERD (gastroesophageal reflux disease)     Past Surgical History  Procedure Laterality Date  . Colonoscopy  11/04/2011    Dr. Stan Headarl Gessner  . Eye surgery      History  Substance Use Topics  . Smoking status: Never Smoker   . Smokeless tobacco: Never Used  . Alcohol Use: 0.0 oz/week    0  Standard drinks or equivalent per week     Comment: occasional    Family History  Problem Relation Age of Onset  . Colon cancer Neg Hx   . Stomach cancer Neg Hx   . Diabetes Mother   . Heart attack Neg Hx   . Cancer Sister     No Known Allergies  Medication list has been reviewed and updated.  Current Outpatient Prescriptions on File Prior to Visit  Medication Sig Dispense Refill  . aspirin 81 MG tablet Take 81 mg by mouth daily.    . fluticasone (FLONASE) 50 MCG/ACT nasal spray Place 2 sprays into both nostrils daily. 16 g 6  . lisinopril (PRINIVIL,ZESTRIL) 5 MG tablet Take 1 tablet (5 mg total) by mouth daily. 90 tablet 3  . Multiple Vitamin (MULTIVITAMIN) tablet Take 1 tablet by mouth daily.    . pravastatin (PRAVACHOL) 40 MG tablet Take 1 tablet (40 mg total) by mouth every evening. 90 tablet 3  . omeprazole (PRILOSEC OTC) 20 MG tablet Take 1 tablet (20 mg total) by mouth daily. 42 tablet 3   No current facility-administered medications on file prior to visit.    Review of Systems:  As per HPI- otherwise negative.   Physical Examination: Filed Vitals:   10/11/14 1217  BP: 142/80  Pulse: 60  Temp: 97.8 F (36.6 C)  Resp: 12   Filed Vitals:   10/11/14 1217  Height: 5' 10.5" (1.791 m)  Weight: 192 lb 8 oz (87.317 kg)   Body mass index is 27.22 kg/(m^2). Ideal Body Weight: Weight in (lb) to have BMI = 25: 176.4  GEN: WDWN, NAD, Non-toxic, A & O x 3, looks well HEENT: Atraumatic, Normocephalic. Neck supple. No masses, No LAD.  Bilateral TM wnl, oropharynx normal.  PEERL,EOMI.  Ears and Nose: No external deformity. CV: RRR, No M/G/R. No JVD. No thrill. No extra heart sounds. PULM: CTA B, no wheezes, crackles, rhonchi. No retractions. No resp. distress. No accessory muscle use. ABD: S, NT, ND. No rebound. No HSM. EXTR: No c/c/e NEURO Normal gait. Normal strength, sensation and DTR all extremities.  Normal romberg and tandem gain.  Positive dix- halpike on the  left only PSYCH: Normally interactive. Conversant. Not depressed or anxious appearing.  Calm demeanor.   Orthostatics:   Laying 142/80, 60 Sitting 156/78, 72 Standing 173/89, 60  Results for orders placed or performed in visit on 10/11/14  POCT CBC  Result Value Ref Range   WBC 8.5 4.6 - 10.2 K/uL   Lymph, poc 2.5 0.6 - 3.4   POC LYMPH PERCENT 30.0 10 - 50 %L   MID (cbc) 0.6 0 - 0.9   POC MID % 6.7 0 - 12 %M   POC Granulocyte 5.4 2 - 6.9   Granulocyte percent 63.3 37 - 80 %G   RBC 4.64 (A) 4.69 - 6.13 M/uL   Hemoglobin 13.9 (A) 14.1 - 18.1 g/dL   HCT, POC 16.1 09.6 - 53.7 %   MCV 94.5 80 - 97 fL   MCH, POC 30.1 27 - 31.2 pg   MCHC 31.9 31.8 - 35.4 g/dL   RDW, POC 04.5 %   Platelet Count, POC 259 142 - 424 K/uL   MPV 8.6 0 - 99.8 fL  POCT glucose (manual entry)  Result Value Ref Range   POC Glucose 98 70 - 99 mg/dl   Assessment and Plan: Benign paroxysmal positional vertigo, left - Plan: POCT CBC, POCT glucose (manual entry), Comprehensive metabolic panel, meclizine (ANTIVERT) 25 MG tablet  Dizziness and giddiness - Plan: POCT glucose (manual entry)  Here today with likely BPPV.  Treat with meclizine as needed Asked him to let me know if not feeling better in the next couple of days- Sooner if worse.     Signed Abbe Amsterdam, MD

## 2014-10-11 NOTE — Patient Instructions (Signed)
Try the meclizine as needed for dizziness. This will make you feel a bit sleepy- Let me know if you do not feel better in the next few days- Sooner if worse.

## 2014-10-12 LAB — COMPREHENSIVE METABOLIC PANEL
ALT: 23 U/L (ref 0–53)
AST: 22 U/L (ref 0–37)
Albumin: 4.4 g/dL (ref 3.5–5.2)
Alkaline Phosphatase: 74 U/L (ref 39–117)
BUN: 16 mg/dL (ref 6–23)
CO2: 26 mEq/L (ref 19–32)
Calcium: 9.2 mg/dL (ref 8.4–10.5)
Chloride: 101 mEq/L (ref 96–112)
Creat: 1.03 mg/dL (ref 0.50–1.35)
Glucose, Bld: 90 mg/dL (ref 70–99)
Potassium: 4.5 mEq/L (ref 3.5–5.3)
Sodium: 137 mEq/L (ref 135–145)
Total Bilirubin: 0.5 mg/dL (ref 0.2–1.2)
Total Protein: 7.5 g/dL (ref 6.0–8.3)

## 2014-10-13 ENCOUNTER — Encounter: Payer: Self-pay | Admitting: Family Medicine

## 2014-12-27 DIAGNOSIS — R351 Nocturia: Secondary | ICD-10-CM | POA: Diagnosis not present

## 2014-12-27 DIAGNOSIS — R3915 Urgency of urination: Secondary | ICD-10-CM | POA: Diagnosis not present

## 2014-12-27 DIAGNOSIS — R300 Dysuria: Secondary | ICD-10-CM | POA: Diagnosis not present

## 2014-12-27 DIAGNOSIS — N401 Enlarged prostate with lower urinary tract symptoms: Secondary | ICD-10-CM | POA: Diagnosis not present

## 2015-02-10 DIAGNOSIS — B351 Tinea unguium: Secondary | ICD-10-CM | POA: Diagnosis not present

## 2015-02-10 DIAGNOSIS — L821 Other seborrheic keratosis: Secondary | ICD-10-CM | POA: Diagnosis not present

## 2015-04-17 ENCOUNTER — Ambulatory Visit: Payer: Medicare Other | Admitting: Family Medicine

## 2015-07-21 ENCOUNTER — Encounter: Payer: Self-pay | Admitting: Family Medicine

## 2015-07-26 ENCOUNTER — Encounter: Payer: Self-pay | Admitting: Family Medicine

## 2015-08-30 ENCOUNTER — Other Ambulatory Visit: Payer: Self-pay | Admitting: Family Medicine

## 2015-08-30 DIAGNOSIS — Z23 Encounter for immunization: Secondary | ICD-10-CM | POA: Diagnosis not present

## 2015-08-31 ENCOUNTER — Telehealth: Payer: Self-pay

## 2015-08-31 NOTE — Telephone Encounter (Signed)
Pt needs a CPE and was told to come into the walk-in center and they were not seen because pt has medicare. Daughter called and was upset that she was told this information and he will need to get a CPE done before he goes out of town. There are no slots sooner for him to be seen.  Please advise his daughter, he does not speak english  Berres,Anatalia [phone]

## 2015-09-04 DIAGNOSIS — R351 Nocturia: Secondary | ICD-10-CM | POA: Diagnosis not present

## 2015-09-04 DIAGNOSIS — N401 Enlarged prostate with lower urinary tract symptoms: Secondary | ICD-10-CM | POA: Diagnosis not present

## 2015-09-04 DIAGNOSIS — Z0000 Encounter for general adult medical examination without abnormal findings: Secondary | ICD-10-CM | POA: Diagnosis not present

## 2015-09-13 NOTE — Telephone Encounter (Signed)
Called patient's daughter.  She said she had called the walkin center and they said they could not see him because of her insurance.  She said he is gone out of the country now.  She will call again when he comes back.  I apologized to her for any inconvenience she may have experienced.

## 2015-10-27 ENCOUNTER — Other Ambulatory Visit: Payer: Self-pay | Admitting: Physician Assistant

## 2017-01-07 DIAGNOSIS — N401 Enlarged prostate with lower urinary tract symptoms: Secondary | ICD-10-CM | POA: Diagnosis not present

## 2017-01-11 ENCOUNTER — Encounter: Payer: Self-pay | Admitting: Emergency Medicine

## 2017-01-11 ENCOUNTER — Ambulatory Visit (INDEPENDENT_AMBULATORY_CARE_PROVIDER_SITE_OTHER): Payer: Medicare Other | Admitting: Emergency Medicine

## 2017-01-11 VITALS — BP 159/80 | HR 50 | Temp 98.1°F | Resp 16 | Ht 71.0 in | Wt 182.6 lb

## 2017-01-11 DIAGNOSIS — J302 Other seasonal allergic rhinitis: Secondary | ICD-10-CM

## 2017-01-11 DIAGNOSIS — E785 Hyperlipidemia, unspecified: Secondary | ICD-10-CM | POA: Diagnosis not present

## 2017-01-11 DIAGNOSIS — I10 Essential (primary) hypertension: Secondary | ICD-10-CM | POA: Diagnosis not present

## 2017-01-11 DIAGNOSIS — Z0000 Encounter for general adult medical examination without abnormal findings: Secondary | ICD-10-CM | POA: Diagnosis not present

## 2017-01-11 DIAGNOSIS — E782 Mixed hyperlipidemia: Secondary | ICD-10-CM | POA: Insufficient documentation

## 2017-01-11 MED ORDER — LISINOPRIL 10 MG PO TABS: 10.0000 mg | ORAL_TABLET | Freq: Every day | ORAL | 3 refills | Status: DC

## 2017-01-11 MED ORDER — PRAVASTATIN SODIUM 40 MG PO TABS: 40.0000 mg | ORAL_TABLET | Freq: Every evening | ORAL | 3 refills | Status: DC

## 2017-01-11 MED ORDER — OMEPRAZOLE MAGNESIUM 20 MG PO TBEC: 20.0000 mg | DELAYED_RELEASE_TABLET | Freq: Every day | ORAL | 5 refills | Status: DC

## 2017-01-11 MED ORDER — FLUTICASONE PROPIONATE 50 MCG/ACT NA SUSP: 2.0000 | Freq: Every day | NASAL | 6 refills | Status: DC

## 2017-01-11 NOTE — Patient Instructions (Addendum)
   IF you received an x-ray today, you will receive an invoice from [facility]. Please contact [facility] at [phone] with questions or concerns regarding your invoice.   IF you received labwork today, you will receive an invoice from [facility]. Please contact [facility] at [phone] with questions or concerns regarding your invoice.   Our billing staff will not be able to assist you with questions regarding bills from these companies.  You will be contacted with the lab results as soon as they are available. The fastest way to get your results is to activate your My Chart account. Instructions are located on the last page of this paperwork. If you have not heard from us regarding the results in 2 weeks, please contact this office.      Mantenimiento de la salud en los hombres (Health Maintenance, Male) Un estilo de vida saludable y los cuidados preventivos son importantes para la salud y el bienestar. Pregntele al mdico cul es el cronograma de exmenes peridicos adecuado para usted. QU DEBO SABER SOBRE EL PESO Y LA DIETA? Consuma una dieta saludable  Coma muchas verduras, frutas, cereales integrales, productos lcteos con bajo contenido de grasa y protenas magras.  No consuma muchos alimentos de alto contenido de grasas slidas, azcares agregados o sal. Mantenga un peso saludable La actividad fsica habitual puede ayudarlo a alcanzar o mantener un peso saludable. Deber hacer lo siguiente:  Realizar al menos 150minutos de actividad fsica por semana. El ejercicio debe aumentar la frecuencia cardaca y provocar la transpiracin (ejercicio de intensidad moderada).  Hacer ejercicios de entrenamiento de fuerza por lo menos dos veces por semana. Controlarse los niveles de colesterol y lpidos en la sangre  Hgase anlisis de sangre para controlar los lpidos y el colesterol cada 5aos a partir de los 35aos. Si tiene un riesgo alto de tener cardiopatas  coronarias, debe comenzar a hacerse anlisis de sangre a los 20aos. Es posible que necesite controlar los niveles de colesterol con mayor frecuencia si: ? Sus niveles de lpidos y colesterol son altos. ? Es mayor de 50aos. ? Tiene un riesgo alto de tener cardiopatas coronarias. QU DEBO SABER SOBRE LAS PRUEBAS DE DETECCIN DEL CNCER? Muchos tipos de cncer se pueden detectar de manera temprana y a menudo prevenirse. Cncer de pulmn  Debe someterse a pruebas de deteccin de cncer de pulmn todos los aos en los siguientes casos: ? Si fuma actualmente y lo ha hecho durante por lo menos 30aos. ? Si fue fumador que dej el hbito en el trmino de los ltimos 15aos.  Hable con el mdico sobre las opciones en relacin con los estudios de deteccin, cundo debe comenzar a hacrselos y con qu frecuencia. Cncer colorrectal  Generalmente, las pruebas de deteccin habituales del cncer colorrectal comienzan a los 50aos y deben repetirse cada 5 a 10aos hasta los 75aos. Es posible que tenga que hacerse las pruebas con mayor frecuencia si se detectan formas tempranas de plipos precancerosos o pequeos bultos. Sin embargo, el mdico podr aconsejarle que lo haga antes, si tiene factores de riesgo para el cncer de colon.  El mdico puede recomendarle que use kits de prueba caseros para hallar sangre oculta en la materia fecal.  Se puede usar una pequea cmara en el extremo de un tubo para examinar el colon (sigmoidoscopia o colonoscopia). Este estudio detecta las formas ms tempranas de cncer colorrectal. Cncer de prstata y de testculo  En funcin de la edad y del estado de salud general, el mdico   puede realizarle determinados estudios de deteccin del cncer de prstata y de testculo.  Hable con el mdico sobre cualquier sntoma o acerca de las inquietudes que tenga sobre el cncer de prstata o de testculo. Cncer de piel  Revise la piel de la cabeza a los pies con  regularidad.  Informe al mdico si aparecen nuevos lunares o si nota cambios en los que ya tiene, especialmente en estos casos: ? Si hay un cambio en el tamao, la forma o el color del lunar. ? Si tiene un lunar que es ms grande que el tamao de una goma de lpiz.  Siempre use pantalla solar. Aplquese pantalla solar de manera generosa y repetida a lo largo del da.  Use mangas y pantalones largos, un sombrero de ala ancha y gafas para el sol cuando est al aire libre, para protegerse. QU DEBO SABER SOBRE LAS CARDIOPATAS CORONARIAS, LA DIABETES Y LA HIPERTENSIN ARTERIAL?  Si usted tiene entre 18 y 39aos, debe medirse la presin arterial cada 3a 5aos. Si usted tiene 40aos o ms, debe medirse la presin arterial todos los aos. Debe medirse la presin arterial dos veces: una vez cuando est en un hospital o una clnica y la otra vez cuando est en otro sitio. Registre el promedio de las dos mediciones. Para controlar su presin arterial cuando no est en un hospital o una clnica, puede usar lo siguiente: ? Una mquina automtica para medir la presin arterial en una farmacia. ? Un monitor para medir la presin arterial en el hogar.  Hable con el mdico sobre los valores ideales de la presin arterial.  Si tiene entre 45 y 79aos, consltele al mdico si debe tomar aspirina para evitar las cardiopatas coronarias.  Hgase anlisis habituales de deteccin de la diabetes; para ello, contrlese la glucemia en ayunas. ? Si su peso es normal y tiene un bajo riesgo de padecer diabetes, realcese este anlisis cada tres aos despus de los 45aos. ? Si tiene sobrepeso y un alto riesgo de padecer diabetes, considere someterse a este anlisis antes o con mayor frecuencia.  Para los hombres que tienen entre 65 y 75aos, y son o han sido fumadores, se recomienda un nico estudio con ecografa para detectar un aneurisma de aorta abdominal (AAA). QU DEBO SABER SOBRE LA PREVENCIN DE LAS  INFECCIONES? HepatitisB Si tiene un riesgo ms alto de contraer hepatitis B, debe someterse a un examen de deteccin de este virus. Hable con el mdico para determinar si corre riesgo de tener una infeccin por hepatitisB. Hepatitis C Se recomienda un anlisis de sangre para:  Todos los que nacieron entre 1945 y 1965.  Todas las personas que tengan un riesgo de haber contrado hepatitis C. Enfermedades de transmisin sexual (ETS)  Debe realizarse pruebas de deteccin de las ETS todos los aos, incluidas la gonorrea y la clamidia, en estos casos: ? Es sexualmente activo y es menor de 24aos. ? Es mayor de 24aos, y el mdico le informa que corre riesgo de tener este tipo de infecciones. ? La actividad sexual ha cambiado desde que le hicieron la ltima prueba de deteccin y tiene un riesgo mayor de tener clamidia o gonorrea. Pregntele al mdico si usted tiene riesgo.  Consulte a su mdico para saber si tiene un alto riesgo de infectarse por el VIH. El mdico puede recomendarle un medicamento de venta con receta para ayudar a evitar la infeccin por el VIH. QU MS PUEDO HACER?  Realcese los estudios de rutina de la salud, dentales   y de la vista.  Mantngase al da con las vacunas (inmunizaciones).  No consuma ningn producto que contenga tabaco, lo que incluye cigarrillos, tabaco de mascar y cigarrillos electrnicos. Si necesita ayuda para dejar de fumar, consulte al mdico.  Limite el consumo de alcohol a no ms de 2medidas por da. Una medida equivale a 12 onzas de cerveza, 5onzas de vino o 1onzas de bebidas alcohlicas de alta graduacin.  No consuma drogas.  No comparta agujas.  Solicite ayuda a su mdico si necesita apoyo o informacin para abandonar las drogas.  Informe a su mdico si a menudo se siente deprimido.  Notifique a su mdico si alguna vez ha sido vctima de abuso o si no se siente seguro en su hogar. Esta informacin no tiene como fin reemplazar el  consejo del mdico. Asegrese de hacerle al mdico cualquier pregunta que tenga. Document Released: 12/14/2007 Document Revised: 07/08/2014 Document Reviewed: 03/21/2015 Elsevier Interactive Patient Education  2018 [employer].  

## 2017-01-11 NOTE — Progress Notes (Signed)
Fernando Saunders 76 y.o.   Chief Complaint  Patient presents with  . Annual Exam    neck injury a year ago     HISTORY OF PRESENT ILLNESS: This is a 76 y.o. male here for annual exam. No complaints or medical concerns.  HPI   Prior to Admission medications   Medication Sig Start Date End Date Taking? Authorizing Provider  aspirin 81 MG tablet Take 81 mg by mouth daily.   Yes [provider]  fluticasone (FLONASE) 50 MCG/ACT nasal spray Place 2 sprays into both nostrils daily. 10/10/14  Yes Copland, Gwenlyn FoundJessica C, MD  lisinopril (PRINIVIL,ZESTRIL) 5 MG tablet TAKE ONE TABLET BY MOUTH ONCE DAILY. NEED OFFICE VISIT 10/28/15  Yes Jeffery, Chelle, PA-C  meclizine (ANTIVERT) 25 MG tablet Take 1/2 or 1 tablet three times a day as needed for dizziness.  Spanish label please 10/11/14  Yes Copland, Gwenlyn FoundJessica C, MD  Multiple Vitamin (MULTIVITAMIN) tablet Take 1 tablet by mouth daily.   Yes [provider]  pravastatin (PRAVACHOL) 40 MG tablet Take 1 tablet (40 mg total) by mouth every evening. 08/08/14  Yes Copland, Gwenlyn FoundJessica C, MD  omeprazole (PRILOSEC OTC) 20 MG tablet Take 1 tablet (20 mg total) by mouth daily. 10/29/11 02/14/14  Iva BoopGessner, Carl E, MD    No Known Allergies  Patient Active Problem List   Diagnosis Date Noted  . Seasonal allergies 10/10/2014  . Essential hypertension 10/10/2014  . Language barrier 10/10/2014  . Chest pain 08/08/2014  . Back pain 03/30/2012  . GERD (gastroesophageal reflux disease) 10/29/2011    Past Medical History:  Diagnosis Date  . Arthritis   . Diverticulosis   . GERD (gastroesophageal reflux disease)   . Hyperlipidemia     Past Surgical History:  Procedure Laterality Date  . COLONOSCOPY  11/04/2011   Dr. Stan Headarl Gessner  . EYE SURGERY      Social History   Social History  . Marital status: Married    Spouse name: N/A  . Number of children: 2  . Years of education: N/A   Occupational History  . Unemployed    Social History Main  Topics  . Smoking status: Never Smoker  . Smokeless tobacco: Never Used  . Alcohol use 0.0 oz/week     Comment: occasional  . Drug use: No  . Sexual activity: No   Other Topics Concern  . Not on file   Social History Narrative  . No narrative on file    Family History  Problem Relation Age of Onset  . Diabetes Mother   . Cancer Sister   . Colon cancer Neg Hx   . Stomach cancer Neg Hx   . Heart attack Neg Hx      Review of Systems  Constitutional: Negative.  Negative for chills, fever and weight loss.  HENT: Negative.  Negative for nosebleeds and sore throat.   Eyes: Negative.  Negative for blurred vision and double vision.  Respiratory: Negative.  Negative for cough, shortness of breath and wheezing.   Cardiovascular: Negative.  Negative for chest pain, palpitations, claudication and leg swelling.  Gastrointestinal: Negative.  Negative for abdominal pain, blood in stool, diarrhea, melena, nausea and vomiting.  Genitourinary: Negative.  Negative for dysuria and hematuria.  Musculoskeletal: Negative.  Negative for myalgias and neck pain.  Skin: Negative.  Negative for rash.  Neurological: Negative.  Negative for dizziness, sensory change, focal weakness and headaches.  Endo/Heme/Allergies: Negative.   All other systems reviewed and are negative.  Vitals:  01/11/17 1400  BP: (!) 159/80  Pulse: (!) 50  Resp: 16  Temp: 98.1 F (36.7 C)     Physical Exam  Constitutional: He is oriented to person, place, and time. He appears well-developed and well-nourished.  HENT:  Head: Normocephalic and atraumatic.  Nose: Nose normal.  Mouth/Throat: Oropharynx is clear and moist. No oropharyngeal exudate.  Eyes: Pupils are equal, round, and reactive to light. Conjunctivae and EOM are normal.  Neck: Normal range of motion. Neck supple. No JVD present.  Cardiovascular: Normal rate, regular rhythm, normal heart sounds and intact distal pulses.   Pulmonary/Chest: Effort normal and  breath sounds normal.  Abdominal: Soft. Bowel sounds are normal. He exhibits no distension. There is no tenderness.  Musculoskeletal: Normal range of motion.  Lymphadenopathy:    He has no cervical adenopathy.  Neurological: He is alert and oriented to person, place, and time. No sensory deficit. He exhibits normal muscle tone.  Skin: Skin is warm and dry. Capillary refill takes less than 2 seconds.  Psychiatric: He has a normal mood and affect. His behavior is normal.  Vitals reviewed.    ASSESSMENT & PLAN: Maurie was seen today for annual exam.  Diagnoses and all orders for this visit:  Essential hypertension -     Comprehensive metabolic panel -     CBC -     lisinopril (PRINIVIL,ZESTRIL) 10 MG tablet; Take 1 tablet (10 mg total) by mouth daily.  Hyperlipidemia, unspecified hyperlipidemia type -     Lipid panel  Routine general medical examination at a health care facility  Dyslipidemia -     pravastatin (PRAVACHOL) 40 MG tablet; Take 1 tablet (40 mg total) by mouth every evening.  Seasonal allergic rhinitis, unspecified trigger -     fluticasone (FLONASE) 50 MCG/ACT nasal spray; Place 2 sprays into both nostrils daily.  Other orders -     Discontinue: omeprazole (PRILOSEC OTC) 20 MG tablet; Take 1 tablet (20 mg total) by mouth daily. -     omeprazole (PRILOSEC OTC) 20 MG tablet; Take 1 tablet (20 mg total) by mouth daily.    Patient Instructions       IF you received an x-ray today, you will receive an invoice from [facility]. Please contact [facility] at [phone] with questions or concerns regarding your invoice.   IF you received labwork today, you will receive an invoice from [place] [facility]. Please contact [facility] at [phone] with questions or concerns regarding your invoice.   Our billing staff will not be able to assist you with questions regarding bills from these companies.  You will be contacted with the lab results as soon as they  are available. The fastest way to get your results is to activate your My Chart account. Instructions are located on the last page of this paperwork. If you have not heard from [place] regarding the results in 2 weeks, please contact this office.        Mantenimiento de Engineer, civil (consulting) hombres (Health Maintenance, Male) Un estilo de vida saludable y los cuidados preventivos son importantes para la salud y Counsellor. Pregntele al mdico cul es el cronograma de exmenes peridicos adecuado para usted. QU DEBO SABER SOBRE EL PESO Y LA DIETA? Consuma una dieta saludable  Coma muchas verduras, frutas, cereales integrales, productos lcteos con bajo contenido de grasa y Associate Professor.  No consuma muchos alimentos de alto contenido de grasas slidas, azcares agregados o sal. Mantenga un peso saludable La actividad fsica habitual  puede ayudarlo a Doctor, hospital un peso saludable. Deber hacer lo siguiente:  Realizar al menos de actividad fsica por semana. El ejercicio debe aumentar la frecuencia cardaca y Development worker, international aid la transpiracin (ejercicio de [place]).  Hacer ejercicios de entrenamiento de fuerza por lo [employer] por semana. Controlarse los niveles de colesterol y lpidos en la sangre  Hgase anlisis de sangre para controlar los lpidos y el colesterol cada 5aos a partir de los 35aos. Si tiene un riesgo alto de Warehouse manager cardiopatas coronarias, debe comenzar a [employer] de [place] a los [place]. Es posible que Insurance underwriter los niveles de colesterol con mayor frecuencia si: ? Sus niveles de lpidos y colesterol son altos. ? Es mayor de 16XWR. ? Tiene un riesgo alto de tener cardiopatas coronarias. QU DEBO SABER SOBRE LAS PRUEBAS DE DETECCIN DEL CNCER? Muchos tipos de cncer se pueden detectar de manera temprana y a menudo prevenirse. Cncer de pulmn  Debe someterse a pruebas de deteccin de cncer de pulmn todos los aos en  los siguientes casos: ? Si fuma actualmente y lo ha hecho durante por lo menos 30aos. ? Si fue fumador que dej el hbito en el trmino de los ltimos 15aos.  Hable con el mdico sobre las opciones en relacin con los estudios de deteccin, cundo debe comenzar a Actuary y con Engineer, structural. Cncer colorrectal  Generalmente, las pruebas de deteccin habituales del cncer colorrectal comienzan a los 50aos y deben repetirse cada 5 a 10aos hasta los 75aos. Es posible que tenga que hacerse las pruebas con mayor frecuencia si se detectan formas tempranas de plipos precancerosos o pequeos bultos. Sin embargo, el mdico podr aconsejarle que lo haga antes, si tiene factores de riesgo para el cncer de colon.  El mdico puede recomendarle que use kits de prueba caseros para Recruitment consultant oculta en la materia fecal.  Se puede usar una pequea cmara en el extremo de un tubo para examinar el colon (sigmoidoscopia o colonoscopia). Este estudio [employer] formas ms tempranas de Building services engineer. Cncer de prstata y de testculo  En funcin de la edad y del [place] de salud general, el mdico puede realizarle determinados estudios de deteccin del cncer de prstata y de testculo.  Hable con el mdico sobre cualquier sntoma o acerca de las inquietudes que tenga sobre el cncer de prstata o de testculo. Cncer de piel  Revise la piel de la cabeza a los pies con regularidad.  Informe al mdico si aparecen nuevos lunares o si nota cambios en los que ya tiene, especialmente en estos casos: ? Si hay un cambio en el tamao, la forma o el color del lunar. ? Si tiene un lunar que es ms grande que el tamao de una goma de Paramedic.  Siempre use pantalla solar. Aplquese pantalla solar de [place] y repetida a lo largo del Futures trader.  Use mangas y Automatic Data, un sombrero de ala ancha y gafas para el sol cuando est al [place] Dawn, para protegerse. QU DEBO SABER SOBRE LAS  CARDIOPATAS CORONARIAS, LA DIABETES Y LA HIPERTENSIN ARTERIAL?  Si usted tiene entre 18 y 39aos, debe medirse la presin arterial cada 3a 5aos. Si usted tiene 40aos o ms, debe medirse la presin arterial [employer]. Debe medirse la presin arterial dos veces: una vez cuando est en un hospital o una clnica y la otra vez cuando est en otro sitio. Registre el promedio de [employer]. Para controlar su presin arterial cuando no  est en un hospital o Paulita Cradle, puede usar lo siguiente: ? Valere Dross automtica para medir la presin arterial en una farmacia. ? Un monitor para medir la presin arterial en el hogar.  Hable con el mdico [employer] ideales de la presin arterial.  Si tiene entre 45 y 79aos, consltele al mdico si debe tomar aspirina para evitar las cardiopatas coronarias.  Hgase anlisis habituales de deteccin de la diabetes; para ello, contrlese la glucemia en ayunas. ? Si su peso es normal y tiene un bajo riesgo de padecer diabetes, realcese este anlisis cada tres aos despus de los 45aos. ? Si tiene sobrepeso y un alto riesgo de padecer diabetes, considere someterse a este anlisis antes o con mayor frecuencia.  Para los hombres que tienen entre 65 y 69aos, y son o han sido fumadores, se recomienda un nico estudio con ecografa para Engineer, manufacturing un aneurisma de aorta abdominal (AAA). QU DEBO SABER SOBRE LA PREVENCIN DE LAS INFECCIONES? HepatitisB Si tiene un riesgo ms alto de Primary school teacher hepatitis B, debe someterse a un examen de deteccin de este virus. Hable con el mdico para determinar si corre riesgo de tener una infeccin por hepatitisB. Hepatitis C Se recomienda un anlisis de [place] para:  Todos los que nacieron entre 1943 y [phone].  Todas las personas que tengan un riesgo de haber contrado hepatitis C. Enfermedades de transmisin sexual (ETS)  Debe realizarse pruebas de Airline pilot de las ETS todos los aos, incluidas la  gonorrea y la clamidia, en estos casos: ? Es sexualmente activo y es menor de [place]. ? Es mayor de 24aos, y Public affairs consultant informa que corre riesgo de tener este tipo de infecciones. ? La actividad sexual ha cambiado desde que le hicieron la ltima prueba de deteccin y tiene un riesgo mayor de Warehouse manager clamidia o Copy. Pregntele al mdico si usted tiene riesgo.  Consulte a su mdico para saber si tiene un alto riesgo de infectarse por el VIH. El mdico puede recomendarle un medicamento de venta con receta para ayudar a evitar la infeccin por el VIH. QU MS PUEDO HACER?  Realcese los estudios de rutina de la salud, dentales y de Wellsite geologist.  Mantngase al da con las vacunas (inmunizaciones).  No consuma ningn producto que contenga tabaco, lo que incluye cigarrillos, tabaco de Theatre manager y Administrator, Civil Service. Si necesita ayuda para dejar de fumar, consulte al mdico.  Limite el consumo de alcohol a no ms de por da. [employer] a 12 onzas de cerveza, 5onzas de vino o 1onzas de bebidas alcohlicas de alta graduacin.  No consuma drogas.  No comparta agujas.  Solicite ayuda a su mdico si necesita apoyo o informacin para abandonar las drogas.  Informe a su mdico si a menudo se siente deprimido.  Notifique a su mdico si alguna vez ha sido vctima de abuso o si no se siente seguro en su hogar. Esta informacin no tiene Theme park manager el consejo del mdico. Asegrese de hacerle al mdico cualquier pregunta que tenga. Document Released: 12/14/2007 Document Revised: 07/08/2014 Document Reviewed: 03/21/2015 Elsevier Interactive Patient Education  2018 Elsevier Inc.      Edwina Barth, MD [facility]

## 2017-01-13 DIAGNOSIS — R3915 Urgency of urination: Secondary | ICD-10-CM | POA: Diagnosis not present

## 2017-01-13 DIAGNOSIS — N400 Enlarged prostate without lower urinary tract symptoms: Secondary | ICD-10-CM | POA: Diagnosis not present

## 2017-01-14 LAB — COMPREHENSIVE METABOLIC PANEL
ALT: 13 IU/L (ref 0–44)
AST: 19 IU/L (ref 0–40)
Albumin/Globulin Ratio: 1.6 (ref 1.2–2.2)
Albumin: 4.6 g/dL (ref 3.5–4.8)
Alkaline Phosphatase: 82 IU/L (ref 39–117)
BUN/Creatinine Ratio: 13 (ref 10–24)
BUN: 15 mg/dL (ref 8–27)
Bilirubin Total: 0.6 mg/dL (ref 0.0–1.2)
CO2: 18 mmol/L — ABNORMAL LOW (ref 20–29)
Calcium: 9.3 mg/dL (ref 8.6–10.2)
Chloride: 101 mmol/L (ref 96–106)
Creatinine, Ser: 1.14 mg/dL (ref 0.76–1.27)
GFR calc Af Amer: 73 mL/min/{1.73_m2} (ref >59)
GFR calc non Af Amer: 63 mL/min/{1.73_m2} (ref >59)
Globulin, Total: 2.9 g/dL (ref 1.5–4.5)
Glucose: 87 mg/dL (ref 65–99)
Potassium: 4.7 mmol/L (ref 3.5–5.2)
Sodium: 141 mmol/L (ref 134–144)
Total Protein: 7.5 g/dL (ref 6.0–8.5)

## 2017-01-14 LAB — CBC
Hematocrit: 43.1 % (ref 37.5–51.0)
Hemoglobin: 14.2 g/dL (ref 13.0–17.7)
MCH: 32.2 pg (ref 26.6–33.0)
MCHC: 32.9 g/dL (ref 31.5–35.7)
MCV: 98 fL — ABNORMAL HIGH (ref 79–97)
Platelets: 269 10*3/uL (ref 150–379)
RBC: 4.41 x10E6/uL (ref 4.14–5.80)
RDW: 12.5 % (ref 12.3–15.4)
WBC: 6.6 10*3/uL (ref 3.4–10.8)

## 2017-01-14 LAB — LIPID PANEL
Chol/HDL Ratio: 5.9 ratio — ABNORMAL HIGH (ref 0.0–5.0)
Cholesterol, Total: 219 mg/dL — ABNORMAL HIGH (ref 100–199)
HDL: 37 mg/dL — ABNORMAL LOW (ref >39)
LDL Calculated: 144 mg/dL — ABNORMAL HIGH (ref 0–99)
Triglycerides: 190 mg/dL — ABNORMAL HIGH (ref 0–149)
VLDL Cholesterol Cal: 38 mg/dL (ref 5–40)

## 2017-02-14 ENCOUNTER — Telehealth: Payer: Self-pay | Admitting: Emergency Medicine

## 2017-02-14 NOTE — Telephone Encounter (Signed)
Patient daughter states pt does not have access to his MyChart and would like someone to give him a call to go over his labs.  Please adv

## 2017-02-26 NOTE — Telephone Encounter (Signed)
Called to go over labs with patient. Spoke to son-in-law who states patient is not available and out of town.

## 2017-07-30 ENCOUNTER — Ambulatory Visit: Payer: Medicare Other | Admitting: Family Medicine

## 2017-08-01 ENCOUNTER — Ambulatory Visit (INDEPENDENT_AMBULATORY_CARE_PROVIDER_SITE_OTHER): Payer: Medicare Other | Admitting: Family Medicine

## 2017-08-01 ENCOUNTER — Ambulatory Visit: Payer: Medicare Other | Admitting: Family Medicine

## 2017-08-01 ENCOUNTER — Encounter: Payer: Self-pay | Admitting: Family Medicine

## 2017-08-01 VITALS — BP 128/82 | HR 62 | Ht 71.0 in | Wt 186.1 lb

## 2017-08-01 DIAGNOSIS — I10 Essential (primary) hypertension: Secondary | ICD-10-CM

## 2017-08-01 DIAGNOSIS — E782 Mixed hyperlipidemia: Secondary | ICD-10-CM

## 2017-08-01 DIAGNOSIS — G8929 Other chronic pain: Secondary | ICD-10-CM | POA: Insufficient documentation

## 2017-08-01 DIAGNOSIS — S161XXA Strain of muscle, fascia and tendon at neck level, initial encounter: Secondary | ICD-10-CM | POA: Insufficient documentation

## 2017-08-01 DIAGNOSIS — M25511 Pain in right shoulder: Secondary | ICD-10-CM | POA: Diagnosis not present

## 2017-08-01 DIAGNOSIS — M25512 Pain in left shoulder: Secondary | ICD-10-CM | POA: Diagnosis not present

## 2017-08-01 DIAGNOSIS — Z87898 Personal history of other specified conditions: Secondary | ICD-10-CM | POA: Insufficient documentation

## 2017-08-01 DIAGNOSIS — Z87448 Personal history of other diseases of urinary system: Secondary | ICD-10-CM | POA: Diagnosis not present

## 2017-08-01 DIAGNOSIS — M25519 Pain in unspecified shoulder: Secondary | ICD-10-CM | POA: Insufficient documentation

## 2017-08-01 MED ORDER — CELECOXIB 200 MG PO CAPS: 200.0000 mg | ORAL_CAPSULE | Freq: Every day | ORAL | 0 refills | Status: DC | PRN

## 2017-08-01 MED ORDER — MIRABEGRON ER 25 MG PO TB24: 25.0000 mg | ORAL_TABLET | Freq: Every day | ORAL | 0 refills | Status: DC

## 2017-08-01 NOTE — Progress Notes (Signed)
Subjective:  Patient ID: Fernando Saunders, male    DOB: 1940-12-09  Age: 77 y.o. MRN: [id number]  CC: Establish Care   HPI B[place]on Broadwater presents for establishment of care with his son-in-law, wife and an interpreter.  He has been seen urology for urinary urgency and is out of the medicine they prescribed he has been asked to follow-up but failed to do so it is out of the medicine.  He is planning on seeing him in the near future and requests a refill of the Mirabito trick.  Past medical history of hypertension and hyperlipidemia controlled on his current medical regimen.  He is nonfasting today and will return for fasting blood work.  He has chronic pain in his neck and bilateral shoulders.  He continues to be active playing softball.  He is right-hand dominant.  He requests medical therapy for this.  He is currently using Tylenol with some relief.  Outpatient Medications Prior to Visit  Medication Sig Dispense Refill  . aspirin 81 MG tablet Take 81 mg by mouth daily.    . fluticasone (FLONASE) 50 MCG/ACT nasal spray Place 2 sprays into both nostrils daily. 16 g 6  . lisinopril (PRINIVIL,ZESTRIL) 10 MG tablet Take 1 tablet (10 mg total) by mouth daily. 90 tablet 3  . Multiple Vitamin (MULTIVITAMIN) tablet Take 1 tablet by mouth daily.    . pravastatin (PRAVACHOL) 40 MG tablet Take 1 tablet (40 mg total) by mouth every evening. 90 tablet 3  . mirabegron ER (MYRBETRIQ) 25 MG TB24 tablet Take 25 mg by mouth daily.    . meclizine (ANTIVERT) 25 MG tablet Take 1/2 or 1 tablet three times a day as needed for dizziness.  Spanish label please 30 tablet 0  . omeprazole (PRILOSEC OTC) 20 MG tablet Take 1 tablet (20 mg total) by mouth daily. 30 tablet 5   No facility-administered medications prior to visit.     ROS Review of Systems  Constitutional: Negative.   HENT: Negative.   Eyes: Negative.   Respiratory: Negative.   Cardiovascular: Negative.   Gastrointestinal: Negative.   Genitourinary:  Positive for urgency.  Musculoskeletal: Positive for arthralgias, neck pain and neck stiffness. Negative for myalgias.  Neurological: Negative for weakness and numbness.  Hematological: Negative.   Psychiatric/Behavioral: Negative.     Objective:  BP 128/82 (BP Location: Left Arm, Patient Position: Sitting, Cuff Size: Normal)   Pulse 62   Ht 5\' 11"  (1.803 m)   Wt 186 lb 2 oz (84.4 kg)   SpO2 97%   BMI 25.96 kg/m   BP Readings from Last 3 Encounters:  08/01/17 128/82  01/11/17 (!) 159/80  10/11/14 (!) 173/89    Wt Readings from Last 3 Encounters:  08/01/17 186 lb 2 oz (84.4 kg)  01/11/17 182 lb 9.6 oz (82.8 kg)  10/11/14 192 lb 8 oz (87.3 kg)    Physical Exam  Constitutional: He is oriented to person, place, and time. He appears well-developed and well-nourished. No distress.  HENT:  Head: Normocephalic and atraumatic.  Right Ear: External ear normal.  Left Ear: External ear normal.  Mouth/Throat: Oropharynx is clear and moist. No oropharyngeal exudate.  Eyes: Conjunctivae are normal. Pupils are equal, round, and reactive to light. Right eye exhibits no discharge. Left eye exhibits no discharge. No scleral icterus.  Neck: Neck supple. No JVD present. No tracheal deviation present. No thyromegaly present.  Cardiovascular: Normal rate and normal heart sounds.  Pulmonary/Chest: Effort normal and breath sounds normal. No stridor.  Abdominal: Soft. Bowel sounds are normal.  Musculoskeletal:       Right shoulder: He exhibits normal range of motion and no tenderness.       Left shoulder: He exhibits normal range of motion and no tenderness.       Cervical back: He exhibits normal range of motion, no tenderness and no bony tenderness.  Lymphadenopathy:    He has no cervical adenopathy.  Neurological: He is alert and oriented to person, place, and time. He has normal strength.  Skin: Skin is dry. He is not diaphoretic. No erythema.  Psychiatric: He has a normal mood and affect.  His behavior is normal.    Lab Results  Component Value Date   WBC 6.6 01/11/2017   HGB 14.2 01/11/2017   HCT 43.1 01/11/2017   PLT 269 01/11/2017   GLUCOSE 87 01/11/2017   CHOL 219 (H) 01/11/2017   TRIG 190 (H) 01/11/2017   HDL 37 (L) 01/11/2017   LDLDIRECT 97 03/04/2012   LDLCALC 144 (H) 01/11/2017   ALT 13 01/11/2017   AST 19 01/11/2017   NA 141 01/11/2017   K 4.7 01/11/2017   CL 101 01/11/2017   CREATININE 1.14 01/11/2017   BUN 15 01/11/2017   CO2 18 (L) 01/11/2017   PSA 1.13 08/08/2014    Ct Abdomen Pelvis Wo Contrast  Result Date: 05/07/2012 *RADIOLOGY REPORT* Clinical Data: Lumbar pain CT ABDOMEN AND PELVIS WITHOUT CONTRAST Technique:  Multidetector CT imaging of the abdomen and pelvis was performed following the standard protocol without intravenous contrast. Comparison: 10/22/2011 L-spine radiograph Findings: Limited images through the lung bases demonstrate no significant appreciable abnormality. The heart size is within normal limits. No pleural or pericardial effusion.  Coronary artery calcification. Organ abnormality/lesion detection is limited in the absence of intravenous contrast. Within this limitation,  unremarkable liver, biliary system, spleen, pancreas, adrenal glands. Mildly lobular renal contours.  No hydronephrosis or hydroureter. No bowel obstruction.  No CT evidence for colitis.  Appendix within normal limits.  No free intraperitoneal air or fluid. There is scattered atherosclerotic calcification of the aorta and its branches. No aneurysmal dilatation. Thin-walled bladder.  Mild prostatomegaly. Multilevel degenerative changes of the imaged spine. No acute or aggressive appearing osseous lesion. IMPRESSION: No acute abdominopelvic process identified by unenhanced CT. Coronary artery calcification and atherosclerotic disease of the aorta and branch vessels. Multilevel degenerative changes of the lumbar spine without acute osseous finding. Mild prostatomegaly.  Original Report Authenticated By: Jearld LeschAndrew  DelGaizo, M.D.    Assessment & Plan:   [place] for establish care.  Diagnoses and all orders for this visit:  Essential hypertension -     CBC; Future -     Comprehensive metabolic panel; Future -     TSH; Future -     Urinalysis, Routine w reflex microscopic; Future  Mixed hyperlipidemia -     CBC; Future -     Comprehensive metabolic panel; Future -     Lipid panel; Future -     TSH; Future -     Urinalysis, Routine w reflex microscopic; Future  Strain of neck muscle, initial encounter -     Ambulatory referral to Sports Medicine -     celecoxib (CELEBREX) 200 MG capsule; Take 1 capsule (200 mg total) by mouth daily as needed (with food.).  Chronic pain of both shoulders -     Ambulatory referral to Sports Medicine -     celecoxib (CELEBREX) 200 MG capsule; Take 1 capsule (  200 mg total) by mouth daily as needed (with food.).  History of urinary urgency -     mirabegron ER (MYRBETRIQ) 25 MG TB24 tablet; Take 1 tablet (25 mg total) by mouth daily.   I have discontinued Yonis Meikle's meclizine and omeprazole. I have also changed his mirabegron ER. Additionally, I am having him start on celecoxib. Lastly, I am having him maintain his multivitamin, aspirin, pravastatin, fluticasone, and lisinopril.  Meds ordered this encounter  Medications  . mirabegron ER (MYRBETRIQ) 25 MG TB24 tablet    Sig: Take 1 tablet (25 mg total) by mouth daily.    Dispense:  30 tablet    Refill:  0  . celecoxib (CELEBREX) 200 MG capsule    Sig: Take 1 capsule (200 mg total) by mouth daily as needed (with food.).    Dispense:  30 capsule    Refill:  0   He will return fasting for blood work and then I will be of refill his other medicines.  He will follow-up with urology for further refills of the Mirabito trick.  He will use Celebrex as needed for his neck and shoulder pain.  Follow-up: No Follow-up on file.  Mliss Sax,  MD

## 2017-08-04 ENCOUNTER — Ambulatory Visit (INDEPENDENT_AMBULATORY_CARE_PROVIDER_SITE_OTHER): Payer: Medicare Other

## 2017-08-04 ENCOUNTER — Encounter: Payer: Self-pay | Admitting: Family Medicine

## 2017-08-04 ENCOUNTER — Ambulatory Visit (INDEPENDENT_AMBULATORY_CARE_PROVIDER_SITE_OTHER): Payer: Medicare Other | Admitting: Family Medicine

## 2017-08-04 ENCOUNTER — Other Ambulatory Visit (INDEPENDENT_AMBULATORY_CARE_PROVIDER_SITE_OTHER): Payer: Medicare Other

## 2017-08-04 VITALS — BP 142/84 | HR 65 | Temp 98.3°F | Ht 71.0 in | Wt 186.0 lb

## 2017-08-04 DIAGNOSIS — E782 Mixed hyperlipidemia: Secondary | ICD-10-CM

## 2017-08-04 DIAGNOSIS — G8929 Other chronic pain: Secondary | ICD-10-CM | POA: Insufficient documentation

## 2017-08-04 DIAGNOSIS — M25511 Pain in right shoulder: Secondary | ICD-10-CM | POA: Diagnosis not present

## 2017-08-04 DIAGNOSIS — S4992XA Unspecified injury of left shoulder and upper arm, initial encounter: Secondary | ICD-10-CM | POA: Diagnosis not present

## 2017-08-04 DIAGNOSIS — M25512 Pain in left shoulder: Secondary | ICD-10-CM

## 2017-08-04 DIAGNOSIS — I10 Essential (primary) hypertension: Secondary | ICD-10-CM | POA: Diagnosis not present

## 2017-08-04 DIAGNOSIS — S4991XA Unspecified injury of right shoulder and upper arm, initial encounter: Secondary | ICD-10-CM | POA: Diagnosis not present

## 2017-08-04 LAB — CBC
HCT: 41.1 % (ref 39.0–52.0)
Hemoglobin: 13.6 g/dL (ref 13.0–17.0)
MCHC: 33 g/dL (ref 30.0–36.0)
MCV: 97.4 fl (ref 78.0–100.0)
Platelets: 222 10*3/uL (ref 150.0–400.0)
RBC: 4.22 Mil/uL (ref 4.22–5.81)
RDW: 12.8 % (ref 11.5–15.5)
WBC: 7.6 10*3/uL (ref 4.0–10.5)

## 2017-08-04 LAB — URINALYSIS, ROUTINE W REFLEX MICROSCOPIC
Bilirubin Urine: NEGATIVE
Hgb urine dipstick: NEGATIVE
Ketones, ur: NEGATIVE
Leukocytes, UA: NEGATIVE
Nitrite: NEGATIVE
RBC / HPF: NONE SEEN (ref 0–?)
Specific Gravity, Urine: 1.025 (ref 1.000–1.030)
Total Protein, Urine: NEGATIVE
Urine Glucose: NEGATIVE
Urobilinogen, UA: 0.2 (ref 0.0–1.0)
pH: 6 (ref 5.0–8.0)

## 2017-08-04 LAB — TSH: TSH: 3.75 u[IU]/mL (ref 0.35–4.50)

## 2017-08-04 LAB — COMPREHENSIVE METABOLIC PANEL
ALT: 14 U/L (ref 0–53)
AST: 17 U/L (ref 0–37)
Albumin: 4.1 g/dL (ref 3.5–5.2)
Alkaline Phosphatase: 93 U/L (ref 39–117)
BUN: 22 mg/dL (ref 6–23)
CO2: 29 mEq/L (ref 19–32)
Calcium: 9 mg/dL (ref 8.4–10.5)
Chloride: 106 mEq/L (ref 96–112)
Creatinine, Ser: 1.17 mg/dL (ref 0.40–1.50)
GFR: 64.73 mL/min (ref >60.00)
Glucose, Bld: 99 mg/dL (ref 70–99)
Potassium: 4.5 mEq/L (ref 3.5–5.1)
Sodium: 145 mEq/L (ref 135–145)
Total Bilirubin: 0.5 mg/dL (ref 0.2–1.2)
Total Protein: 7.3 g/dL (ref 6.0–8.3)

## 2017-08-04 LAB — LDL CHOLESTEROL, DIRECT: Direct LDL: 106 mg/dL

## 2017-08-04 LAB — LIPID PANEL
Cholesterol: 177 mg/dL (ref 0–200)
HDL: 35.9 mg/dL — ABNORMAL LOW (ref >39.00)
NonHDL: 140.85
Total CHOL/HDL Ratio: 5
Triglycerides: 218 mg/dL — ABNORMAL HIGH (ref 0.0–149.0)
VLDL: 43.6 mg/dL — ABNORMAL HIGH (ref 0.0–40.0)

## 2017-08-04 NOTE — Assessment & Plan Note (Signed)
He would like to be able to throw a baseball again. Unclear if this from the joint. His Rotator cuff looks good and moves well.  - xray today  - continue celebrex  - if no improvement consider GH or AC joint injection
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
## 2017-08-04 NOTE — Assessment & Plan Note (Signed)
Possible for [facility] being the course. Rotator cuff looks good.  - xray today  - if no improvement consider PT vs injection

## 2017-08-04 NOTE — Patient Instructions (Signed)
We will call you with the results from today   Please continue the celebrex for 14 days straight and then as needed.   Take tylenol 650 mg three times a day is the best evidence based medicine we have for arthritis.   Glucosamine sulfate 750mg  twice a day is a supplement that has been shown to help moderate to severe arthritis.  Vitamin D 2000 IU daily  Fish oil 2 grams daily.   Tumeric 500mg  twice daily.   Capsaicin topically up to four times a day may also help with pain.  Follow up with me in 4-6 weeks if the pain hasn't improved and we can consider an injection

## 2017-08-05 ENCOUNTER — Telehealth: Payer: Self-pay | Admitting: Family Medicine

## 2017-08-05 NOTE — Telephone Encounter (Signed)
Left VM for patient. If him calls back please have him speak with a nurse/CMA and inform that his shoulder joint looks good but does have some AC joint arthritis.   If any questions then please take the best time and phone number to call and I will try to call him back.   Myra RudeSchmitz, Jeremy E, MD [facility] 08/05/2017, 8:22 AM

## 2017-08-11 ENCOUNTER — Telehealth: Payer: Self-pay

## 2017-08-11 DIAGNOSIS — N400 Enlarged prostate without lower urinary tract symptoms: Secondary | ICD-10-CM | POA: Diagnosis not present

## 2017-08-11 NOTE — Telephone Encounter (Signed)
Ok for referral?   Copied from CRM [phone]. Topic: Inquiry >> Aug 11, 2017  4:07 PM Alexander BergeronBarksdale, Harvey B wrote: Reason for CRM: pt's daughter called to see if pt needs a referral to see a dermatologist, contact pt to advise

## 2017-08-12 NOTE — Telephone Encounter (Signed)
There has been no mention of skin issues.

## 2017-08-12 NOTE — Telephone Encounter (Signed)
I called and spoke with daughter. Patient has been having some skin on his chest that is darker than it is in other areas. Patient has seen a dermatologist before, phone number to that dermatologist provided to daughter.

## 2017-11-10 ENCOUNTER — Telehealth: Payer: Self-pay | Admitting: Family Medicine

## 2017-11-10 NOTE — Telephone Encounter (Signed)
I called and spoke with patient's daughter. Patient will make a follow up appointment for some time this month & will come fasting to re-check his lipid panel.

## 2017-11-10 NOTE — Telephone Encounter (Signed)
Patient daughter came into office today. Patient daughter wanted to know when should patient return for office visit. She thought patient had an upcoming appointment, but there is not appointment on file. I did review the AVS and last office note, but I did not see when or if patient needed to return. Please contact patient daughter Celene Skeen [phone].

## 2017-11-10 NOTE — Telephone Encounter (Signed)
When does patient need to come back

## 2017-11-10 NOTE — Telephone Encounter (Signed)
Patient needs to rtc fasting.

## 2017-12-08 ENCOUNTER — Encounter: Payer: Self-pay | Admitting: Family Medicine

## 2017-12-08 ENCOUNTER — Ambulatory Visit (INDEPENDENT_AMBULATORY_CARE_PROVIDER_SITE_OTHER): Payer: Medicare Other | Admitting: Family Medicine

## 2017-12-08 VITALS — BP 118/76 | HR 64 | Ht 71.0 in | Wt 170.1 lb

## 2017-12-08 DIAGNOSIS — Z91199 Patient's noncompliance with other medical treatment and regimen due to unspecified reason: Secondary | ICD-10-CM | POA: Insufficient documentation

## 2017-12-08 DIAGNOSIS — Z9119 Patient's noncompliance with other medical treatment and regimen: Secondary | ICD-10-CM

## 2017-12-08 DIAGNOSIS — E785 Hyperlipidemia, unspecified: Secondary | ICD-10-CM

## 2017-12-08 DIAGNOSIS — E782 Mixed hyperlipidemia: Secondary | ICD-10-CM | POA: Diagnosis not present

## 2017-12-08 DIAGNOSIS — I10 Essential (primary) hypertension: Secondary | ICD-10-CM | POA: Diagnosis not present

## 2017-12-08 LAB — COMPREHENSIVE METABOLIC PANEL
ALT: 10 U/L (ref 0–53)
AST: 14 U/L (ref 0–37)
Albumin: 4.1 g/dL (ref 3.5–5.2)
Alkaline Phosphatase: 77 U/L (ref 39–117)
BUN: 20 mg/dL (ref 6–23)
CO2: 31 mEq/L (ref 19–32)
Calcium: 9.5 mg/dL (ref 8.4–10.5)
Chloride: 104 mEq/L (ref 96–112)
Creatinine, Ser: 1.18 mg/dL (ref 0.40–1.50)
GFR: 64.04 mL/min (ref >60.00)
Glucose, Bld: 96 mg/dL (ref 70–99)
Potassium: 5.2 mEq/L — ABNORMAL HIGH (ref 3.5–5.1)
Sodium: 140 mEq/L (ref 135–145)
Total Bilirubin: 0.7 mg/dL (ref 0.2–1.2)
Total Protein: 7 g/dL (ref 6.0–8.3)

## 2017-12-08 LAB — LIPID PANEL
Cholesterol: 182 mg/dL (ref 0–200)
HDL: 27.2 mg/dL — ABNORMAL LOW (ref >39.00)
LDL Cholesterol: 122 mg/dL — ABNORMAL HIGH (ref 0–99)
NonHDL: 155.08
Total CHOL/HDL Ratio: 7
Triglycerides: 164 mg/dL — ABNORMAL HIGH (ref 0.0–149.0)
VLDL: 32.8 mg/dL (ref 0.0–40.0)

## 2017-12-08 MED ORDER — LISINOPRIL 10 MG PO TABS: 10.0000 mg | ORAL_TABLET | Freq: Every day | ORAL | 3 refills | Status: DC

## 2017-12-08 MED ORDER — PRAVASTATIN SODIUM 40 MG PO TABS: 40.0000 mg | ORAL_TABLET | Freq: Every evening | ORAL | 3 refills | Status: DC

## 2017-12-08 NOTE — Patient Instructions (Signed)
 Cmo controlar su hipertensin Managing Your Hypertension La hipertensin se denomina usualmente presin arterial alta. Ocurre cuando la sangre presiona contra las paredes de las arterias con demasiada fuerza. Las arterias son los vasos sanguneos que transportan la sangre desde el corazn hacia todas las partes del cuerpo. La hipertensin hace que el corazn haga ms esfuerzo para bombear sangre y puede provocar que las arterias se estrechen o endurezcan. La hipertensin no tratada o no controlada puede causar infarto de miocardio, accidente cerebrovascular, enfermedad renal y otros problemas. Qu son las lecturas de presin arterial? Una lectura de la presin arterial consiste de un nmero ms alto sobre un nmero ms bajo. En condiciones ideales, la presin arterial debe estar por debajo de 120/80. El primer nmero ("superior") es la presin sistlica. Es la medida de la presin de las arterias cuando el corazn late. El segundo nmero ("inferior") es la presin diastlica. Es la medida de la presin en las arterias cuando el corazn se relaja. Qu significa mi lectura de presin arterial? La presin arterial se clasifica en cuatro etapas. Sobre la base de la lectura de su presin arterial, el mdico puede usar las siguientes etapas para determinar si necesita tratamiento y de qu tipo. La presin sistlica y la presin diastlica se miden en una unidad llamada mm Hg. Normal  Presin sistlica: por debajo de 120.  Presin diastlica: por debajo de 80. Elevada  Presin sistlica: 120-129.  Presin diastlica: por debajo de 80. Etapa 1 de hipertensin  Presin sistlica: 130-139.  Presin diastlica: 80-89. Etapa 2 de hipertensin  Presin sistlica: 140 o ms.  Presin diastlica: 90 o ms. Cules son los riesgos para la salud asociados con la hipertensin? Controlar la hipertensin es una responsabilidad importante. La hipertensin no controlada puede causar:  Infarto de  miocardio.  Accidente cerebrovascular.  Debilitamiento de los vasos sanguneos (aneurisma).  Insuficiencia cardaca.  Dao renal.  Dao ocular.  Sndrome metablico.  Problemas de memoria y concentracin.  Qu cambios puedo hacer para controlar mi hipertensin? La hipertensin se puede controlar haciendo cambios en el estilo de vida y, posiblemente, tomando medicamentos. Su mdico le ayudar a crear un plan para bajar la presin arterial al rango normal. Comida y bebida  Siga una dieta con alto contenido de fibras y potasio, y con bajo contenido de sal (sodio), azcar agregada y grasas. Un ejemplo de plan alimenticio es la dieta DASH (Dietary Approaches to Stop Hypertension, Mtodos alimenticios para detener la hipertensin). Para alimentarse de esta manera: ? Coma mucha fruta y verdura fresca. Trate de que la mitad del plato de cada comida sea de frutas y verduras. ? Coma cereales integrales, como pasta integral, arroz integral y pan integral. Llene aproximadamente un cuarto del plato con cereales integrales. ? Consuma productos lcteos con bajo contenido de grasa. ? Evite la ingesta de cortes de carne grasa, carne procesada o curada, y carne de ave con piel. Llene aproximadamente un cuarto del plato con protenas magras, como pescado, pollo sin piel, frijoles, huevos y tofu. ? Evite ingerir alimentos prehechos o procesados. En general, estos tienen mayor cantidad de sodio, azcar agregada y grasa.  Reduzca su ingesta diaria de sodio. La mayora de las personas que tienen hipertensin deben comer menos de 1500 mg de sodio por da.  Limite el consumo de alcohol a no ms de 1 medida por da si es mujer y no est embarazada y a 2 medidas por da si es hombre. Una medida equivale a 12onzas de cerveza, 5onzas de   o 1onzas de bebidas alcohlicas de alta graduacin. Estilo de vida  Trabaje con su mdico para mantener un peso saludable o Curator. Pregntele cual es su peso  recomendado.  Realice al menos 30 minutos de ejercicio que haga que se acelere su corazn (ejercicio Magazine features editor) la [employer] de la [place]. Estas actividades pueden incluir caminar, nadar o andar en bicicleta.  Incluya ejercicios para fortalecer sus msculos (ejercicios de resistencia), como levantamiento de pesas, como parte de su rutina semanal de ejercicios. Intente realizar de este tipo de ejercicios al [employer] a la [place].  No consuma ningn producto que contenga nicotina o tabaco, como cigarrillos y Administrator, Civil Service. Si necesita ayuda para dejar de fumar, consulte al [employer].  Controle las enfermedades a largo plazo (crnicas), como el colesterol alto o la diabetes. Control  Contrlese la presin arterial en su casa segn las indicaciones del mdico. La presin arterial deseada puede variar en funcin de las enfermedades, la edad y otros factores personales.  Contrlese la presin arterial de manera regular, en la frecuencia indicada por su mdico. Trabaje con su mdico  Revise con su mdico todos los medicamentos que toma ya que puede haber efectos secundarios o interacciones.  Hable con su mdico acerca de la dieta, hbitos de ejercicio y otros factores del estilo de vida que pueden contribuir a la hipertensin.  Consulte a su mdico regularmente. Su mdico puede ayudarle a crear y [place] su plan para controlar la hipertensin. Debo tomar un medicamento para controlar mi presin arterial? El mdico puede recetarle medicamentos si los cambios en el estilo de vida no son suficientes para Museum/gallery curator la presin arterial y si:  Su presin arterial sistlica es de 130 o ms.  Su presin arterial diastlica es de 80 o ms.  Tome los medicamentos solamente como se lo haya indicado el mdico. Siga cuidadosamente las indicaciones. Los medicamentos para la presin arterial deben tomarse segn las indicaciones. Los medicamentos pierden eficacia al  omitir las dosis. El hecho de omitir las dosis tambin [place] el riesgo de otros problemas. Comunquese con un mdico si:  Piensa que tiene Runner, broadcasting/film/video a los medicamentos que ha tomado.  Tiene dolores de cabeza frecuentes (recurrentes).  Siente mareos.  Tiene hinchazn en los tobillos.  Tiene problemas de visin. Solicite ayuda de inmediato si:  Siente un dolor de cabeza intenso o confusin.  Siente debilidad inusual, adormecimiento o que Hospital doctor.  Siente un dolor intenso en el pecho o el abdomen.  Vomita repetidas veces.  Tiene dificultad para respirar. Resumen  La hipertensin se produce cuando la sangre bombea en las arterias con mucha fuerza. Si esta afeccin no se controla, podra correr riesgo de tener complicaciones graves.  La presin arterial deseada puede variar en funcin de las enfermedades, la edad y otros factores personales. Para la [employer], una presin arterial normal es menor que 120/80.  La hipertensin se puede controlar mediante cambios en el estilo de vida, tomando medicamentos, o ambas cosas. Los [employer] estilo de vida incluyen prdida de peso, ingerir alimentos sanos, seguir una dieta baja en sodio, hacer ms ejercicio y Glass blower/designer consumo de alcohol. Esta informacin no tiene Theme park manager el consejo del mdico. Asegrese de hacerle al mdico cualquier pregunta que tenga. Document Released: 03/11/2012 Document Revised: 05/29/2016 Document Reviewed: 05/29/2016 Elsevier Interactive Patient Education  2018 [employer].  Mantenimiento de la salud en los hombres (Health Maintenance, Male) Un estilo de [place]  saludable y los cuidados preventivos son importantes para la salud y Counsellor. Pregntele al mdico cul es el cronograma de exmenes peridicos adecuado para usted. QU DEBO SABER SOBRE EL PESO Y LA DIETA? Consuma una dieta saludable  Coma muchas verduras, frutas, cereales integrales, productos lcteos  con bajo contenido de grasa y Associate Professor.  No consuma muchos alimentos de alto contenido de grasas slidas, azcares agregados o sal. Mantenga un peso saludable La actividad fsica habitual puede ayudarlo a Barista o mantener un peso saludable. Deber hacer lo siguiente:  Realizar al menos de actividad fsica por semana. El ejercicio debe aumentar la frecuencia cardaca y Development worker, international aid la transpiracin (ejercicio de [place]).  Hacer ejercicios de entrenamiento de fuerza por lo [employer] por semana. Controlarse los niveles de colesterol y lpidos en la sangre  Hgase anlisis de sangre para controlar los lpidos y el colesterol cada 5aos a partir de los 35aos. Si tiene un riesgo alto de Warehouse manager cardiopatas coronarias, debe comenzar a [employer]ant de [place] a los [place]. Es posible que Insurance underwriter los niveles de colesterol con mayor frecuencia si: ? Sus niveles de lpidos y colesterol son altos. ? Es mayor de [id number]. ? Tiene un riesgo alto de tener cardiopatas coronarias. QU DEBO SABER SOBRE LAS PRUEBAS DE DETECCIN DEL CNCER? Muchos tipos de cncer se pueden detectar de manera temprana y a menudo prevenirse. Cncer de pulmn  Debe someterse a pruebas de deteccin de cncer de pulmn todos los aos en los siguientes casos: ? Si fuma actualmente y lo ha hecho durante por lo menos 30aos. ? Si fue fumador que dej el hbito en el trmino de los ltimos 15aos.  Hable con el mdico sobre las opciones en relacin con los estudios de deteccin, cundo debe comenzar a Actuary y con Engineer, structural. Cncer colorrectal  Generalmente, las pruebas de deteccin habituales del cncer colorrectal comienzan a los 50aos y deben repetirse cada 5 a 10aos hasta los 75aos. Es posible que tenga que hacerse las pruebas con mayor frecuencia si se detectan formas tempranas de plipos precancerosos o pequeos bultos. Sin embargo, el mdico podr  aconsejarle que lo haga antes, si tiene factores de riesgo para el cncer de colon.  El mdico puede recomendarle que use kits de prueba caseros para Recruitment consultant oculta en la materia fecal.  Se puede usar una pequea cmara en el extremo de un tubo para examinar el colon (sigmoidoscopia o colonoscopia). Este estudio [employer] formas ms tempranas de Building services engineer. Cncer de prstata y de testculo  En funcin de la edad y del [place] de salud general, el mdico puede realizarle determinados estudios de deteccin del cncer de prstata y de testculo.  Hable con el mdico sobre cualquier sntoma o acerca de las inquietudes que tenga sobre el cncer de prstata o de testculo. Cncer de piel  Revise la piel de la cabeza a los pies con regularidad.  Informe al mdico si aparecen nuevos lunares o si nota cambios en los que ya tiene, especialmente en estos casos: ? Si hay un cambio en el tamao, la forma o el color del lunar. ? Si tiene un lunar que es ms grande que el tamao de una goma de Paramedic.  Siempre use pantalla solar. Aplquese pantalla solar de [place] y repetida a lo largo del Futures trader.  Use mangas y Automatic Data, un sombrero de ala ancha y gafas para el sol cuando est al [place], para protegerse. QU DEBO SABER SOBRE  LAS CARDIOPATAS CORONARIAS, LA DIABETES Y LA HIPERTENSIN ARTERIAL?  Si usted tiene entre 18 y 39aos, debe medirse la presin arterial cada 3a 5aos. Si usted tiene 40aos o ms, debe medirse la presin arterial [employer]. Debe medirse la presin arterial dos veces: una vez cuando est en un hospital o una clnica y la otra vez cuando est en otro sitio. Registre el promedio de [employer]. Para controlar su presin arterial cuando no est en un hospital o Paulita Cradle, puede usar lo siguiente: ? Valere Dross automtica para medir la presin arterial en una farmacia. ? Un monitor para medir la presin arterial en el hogar.  Hable con  el mdico [employer] ideales de la presin arterial.  Si tiene entre 45 y 79aos, consltele al mdico si debe tomar aspirina para evitar las cardiopatas coronarias.  Hgase anlisis habituales de deteccin de la diabetes; para ello, contrlese la glucemia en ayunas. ? Si su peso es normal y tiene un bajo riesgo de padecer diabetes, realcese este anlisis cada tres aos despus de los 45aos. ? Si tiene sobrepeso y un alto riesgo de padecer diabetes, considere someterse a este anlisis antes o con mayor frecuencia.  Para los hombres que tienen entre 65 y 13aos, y son o han sido fumadores, se recomienda un nico estudio con ecografa para Engineer, manufacturing un aneurisma de aorta abdominal (AAA). QU DEBO SABER SOBRE LA PREVENCIN DE LAS INFECCIONES? HepatitisB Si tiene un riesgo ms alto de Primary school teacher hepatitis B, debe someterse a un examen de deteccin de este virus. Hable con el mdico para determinar si corre riesgo de tener una infeccin por hepatitisB. Hepatitis C Se recomienda un anlisis de [place] para:  Todos los que nacieron entre 1945 y [phone].  Todas las personas que tengan un riesgo de haber contrado hepatitis C. Enfermedades de transmisin sexual (ETS)  Debe realizarse pruebas de Airline pilot de las ETS todos los aos, incluidas la gonorrea y la clamidia, en estos casos: ? Es sexualmente activo y es menor de [place]. ? Es mayor de 24aos, y Public affairs consultant informa que corre riesgo de tener este tipo de infecciones. ? La actividad sexual ha cambiado desde que le hicieron la ltima prueba de deteccin y tiene un riesgo mayor de Warehouse manager clamidia o Copy. Pregntele al mdico si usted tiene riesgo.  Consulte a su mdico para saber si tiene un alto riesgo de infectarse por el VIH. El mdico puede recomendarle un medicamento de venta con receta para ayudar a evitar la infeccin por el VIH. QU MS PUEDO HACER?  Realcese los estudios de rutina de la salud, dentales y de Research scientist (medical).  Mantngase al da con las vacunas (inmunizaciones).  No consuma ningn producto que contenga tabaco, lo que incluye cigarrillos, tabaco de Theatre manager y Administrator, Civil Service. Si necesita ayuda para dejar de fumar, consulte al mdico.  Limite el consumo de alcohol a no ms de por da. [employer]ner a 12 onzas de cerveza, 5onzas de vino o 1onzas de bebidas alcohlicas de alta graduacin.  No consuma drogas.  No comparta agujas.  Solicite ayuda a su mdico si necesita apoyo o informacin para abandonar las drogas.  Informe a su mdico si a menudo se siente deprimido.  Notifique a su mdico si alguna vez ha sido vctima de abuso o si no se siente seguro en su hogar. Esta informacin no tiene Theme park manager el consejo del mdico. Asegrese de hacerle al mdico cualquier pregunta que tenga. Document  Released: 12/14/2007 Document Revised: 07/08/2014 Document Reviewed: 03/21/2015 Elsevier Interactive Patient Education  [employer]2018 Elsevier Inc.

## 2017-12-08 NOTE — Progress Notes (Addendum)
Subjective:  Patient ID: Fernando Saunders, male    DOB: 1941-02-04  Age: 77 y.o. MRN: [id number]  CC: Follow-up   [facility] for follow-up of his hypertension and hyperlipidemia.  He has been taking his medicines as directed and having no problems with him.  He denies myalgias arthralgias headache blurred vision weakness or paresthesias.  He comes in fasting today.  He is accompanied by his daughter is helping us translate.  He is scheduled to see the dentist soon.  He is status post eye exam last month.  He has seen the urologist who also checks his prostate.  He has been exercising by walking.  He is currently not playing softball.  He is status post 12 pound intentional weight loss.  He is done this with exercising and a heart healthy diet.  Outpatient Medications Prior to Visit  Medication Sig Dispense Refill  . aspirin 81 MG tablet Take 81 mg by mouth daily.    . celecoxib (CELEBREX) 200 MG capsule Take 1 capsule (200 mg total) by mouth daily as needed (with food.). 30 capsule 0  . fluticasone (FLONASE) 50 MCG/ACT nasal spray Place 2 sprays into both nostrils daily. 16 g 6  . mirabegron ER (MYRBETRIQ) 25 MG TB24 tablet Take 1 tablet (25 mg total) by mouth daily. 30 tablet 0  . Multiple Vitamin (MULTIVITAMIN) tablet Take 1 tablet by mouth daily.    Marland Kitchen. lisinopril (PRINIVIL,ZESTRIL) 10 MG tablet Take 1 tablet (10 mg total) by mouth daily. 90 tablet 3  . pravastatin (PRAVACHOL) 40 MG tablet Take 1 tablet (40 mg total) by mouth every evening. 90 tablet 3   No facility-administered medications prior to visit.     ROS Review of Systems  Constitutional: Negative for chills, fatigue, fever and unexpected weight change.  HENT: Negative.   Eyes: Negative.  Negative for photophobia and visual disturbance.  Respiratory: Negative.   Cardiovascular: Negative.   Gastrointestinal: Negative.   Endocrine: Negative for polyphagia and polyuria.  Genitourinary: Negative for decreased  urine volume, difficulty urinating and hematuria.  Musculoskeletal: Negative for gait problem, joint swelling and myalgias.  Skin: Negative.   Allergic/Immunologic: Negative for immunocompromised state.  Neurological: Negative for headaches.  Hematological: Does not bruise/bleed easily.  Psychiatric/Behavioral: Negative.     Objective:  BP 118/76   Pulse 64   Ht 5\' 11"  (1.803 m)   Wt 170 lb 2 oz (77.2 kg)   SpO2 97%   BMI 23.73 kg/m   BP Readings from Last 3 Encounters:  12/08/17 118/76  08/04/17 (!) 142/84  08/01/17 128/82    Wt Readings from Last 3 Encounters:  12/08/17 170 lb 2 oz (77.2 kg)  08/04/17 186 lb (84.4 kg)  08/01/17 186 lb 2 oz (84.4 kg)    Physical Exam  Constitutional: He is oriented to person, place, and time. He appears well-developed and well-nourished. No distress.  HENT:  Head: Normocephalic and atraumatic.  Right Ear: External ear normal.  Left Ear: External ear normal.  Mouth/Throat: Oropharynx is clear and moist. No oropharyngeal exudate.  Eyes: Pupils are equal, round, and reactive to light. Conjunctivae and EOM are normal. Right eye exhibits no discharge. Left eye exhibits no discharge. No scleral icterus.  Neck: Normal range of motion. Neck supple. No JVD present. No tracheal deviation present. No thyromegaly present.  Cardiovascular: Normal rate, regular rhythm and normal heart sounds.  Pulmonary/Chest: Effort normal and breath sounds normal.  Abdominal: Bowel sounds are normal.  Musculoskeletal: He exhibits  no tenderness or deformity.  Lymphadenopathy:    He has no cervical adenopathy.  Neurological: He is alert and oriented to person, place, and time.  Skin: Skin is warm and dry. He is not diaphoretic.  Psychiatric: He has a normal mood and affect. His behavior is normal.    Lab Results  Component Value Date   WBC 7.6 08/04/2017   HGB 13.6 08/04/2017   HCT 41.1 08/04/2017   PLT 222.0 08/04/2017   GLUCOSE 96 12/08/2017   CHOL 182  12/08/2017   TRIG 164.0 (H) 12/08/2017   HDL 27.20 (L) 12/08/2017   LDLDIRECT 106.0 08/04/2017   LDLCALC 122 (H) 12/08/2017   ALT 10 12/08/2017   AST 14 12/08/2017   NA 140 12/08/2017   K 5.2 (H) 12/08/2017   CL 104 12/08/2017   CREATININE 1.18 12/08/2017   BUN 20 12/08/2017   CO2 31 12/08/2017   TSH 3.75 08/04/2017   PSA 1.13 08/08/2014    Ct Abdomen Pelvis Wo Contrast  Result Date: 05/07/2012 *RADIOLOGY REPORT* Clinical Data: Lumbar pain CT ABDOMEN AND PELVIS WITHOUT CONTRAST Technique:  Multidetector CT imaging of the abdomen and pelvis was performed following the standard protocol without intravenous contrast. Comparison: 10/22/2011 L-spine radiograph Findings: Limited images through the lung bases demonstrate no significant appreciable abnormality. The heart size is within normal limits. No pleural or pericardial effusion.  Coronary artery calcification. Organ abnormality/lesion detection is limited in the absence of intravenous contrast. Within this limitation,  unremarkable liver, biliary system, spleen, pancreas, adrenal glands. Mildly lobular renal contours.  No hydronephrosis or hydroureter. No bowel obstruction.  No CT evidence for colitis.  Appendix within normal limits.  No free intraperitoneal air or fluid. There is scattered atherosclerotic calcification of the aorta and its branches. No aneurysmal dilatation. Thin-walled bladder.  Mild prostatomegaly. Multilevel degenerative changes of the imaged spine. No acute or aggressive appearing osseous lesion. IMPRESSION: No acute abdominopelvic process identified by unenhanced CT. Coronary artery calcification and atherosclerotic disease of the aorta and branch vessels. Multilevel degenerative changes of the lumbar spine without acute osseous finding. Mild prostatomegaly. Original Report Authenticated By: Jearld Lesch, M.D.    Assessment & Plan:   Deaire was seen today for follow-up.  Diagnoses and all orders for this  visit:  Essential hypertension -     Comprehensive metabolic panel -     lisinopril (PRINIVIL,ZESTRIL) 10 MG tablet; Take 1 tablet (10 mg total) by mouth daily.  Mixed hyperlipidemia -     Comprehensive metabolic panel -     Lipid panel  Dyslipidemia -     pravastatin (PRAVACHOL) 40 MG tablet; Take 1 tablet (40 mg total) by mouth every evening.  Medically noncompliant   I am having [facility] maintain his multivitamin, aspirin, fluticasone, mirabegron ER, celecoxib, lisinopril, and pravastatin.  Meds ordered this encounter  Medications  . lisinopril (PRINIVIL,ZESTRIL) 10 MG tablet    Sig: Take 1 tablet (10 mg total) by mouth daily.    Dispense:  90 tablet    Refill:  3  . pravastatin (PRAVACHOL) 40 MG tablet    Sig: Take 1 tablet (40 mg total) by mouth every evening.    Dispense:  90 tablet    Refill:  3     Follow-up: Return in about 6 months (around 06/09/2018).  Mliss Sax, MD

## 2017-12-09 ENCOUNTER — Encounter: Payer: Self-pay | Admitting: Family Medicine

## 2018-05-11 ENCOUNTER — Ambulatory Visit (INDEPENDENT_AMBULATORY_CARE_PROVIDER_SITE_OTHER): Payer: Medicare Other | Admitting: Family Medicine

## 2018-05-11 ENCOUNTER — Encounter: Payer: Self-pay | Admitting: Family Medicine

## 2018-05-11 ENCOUNTER — Ambulatory Visit: Payer: Medicare Other | Admitting: Family Medicine

## 2018-05-11 VITALS — BP 98/70 | HR 53 | Temp 98.2°F | Ht 71.0 in | Wt 176.6 lb

## 2018-05-11 DIAGNOSIS — L918 Other hypertrophic disorders of the skin: Secondary | ICD-10-CM | POA: Diagnosis not present

## 2018-05-11 NOTE — Patient Instructions (Signed)
Skin Tag, Adult A skin tag (acrochordon) is a soft, extra growth of skin. Most skin tags are flesh-colored and rarely bigger than a pencil eraser. They commonly form near areas where there are folds in the skin, such as the armpit or groin. Skin tags are not dangerous, and they do not spread from person to person (are not contagious). You may have one skin tag or several. Skin tags do not require treatment. However, your health care provider may recommend removal of a skin tag if it:  Gets irritated from clothing.  Bleeds.  Is visible and unsightly.  Your health care provider can remove skin tags with a simple surgical procedure or a procedure that involves freezing the skin tag. Follow these instructions at home:  Watch for any changes in your skin tag. A normal skin tag does not require any other special care at home.  Take over-the-counter and prescription medicines only as told by your health care provider.  Keep all follow-up visits as told by your health care provider. This is important. Contact a health care provider if:  You have a skin tag that: ? Becomes painful. ? Changes color. ? Bleeds. ? Swells.  You develop more skin tags. This information is not intended to replace advice given to you by your health care provider. Make sure you discuss any questions you have with your health care provider. Document Released: 07/02/2015 Document Revised: 02/11/2016 Document Reviewed: 07/02/2015 Elsevier Interactive Patient Education  2018 [employer].  Papiloma cutneo en los adultos Skin Tag, Adult Un papiloma cutneo (acrocordn) es un crecimiento extra de piel suave. La mayora de los papilomas cutneos tienen el mismo color de la piel y no son ms grandes que la goma de un lpiz. Por lo general, se forman en zonas donde hay pliegues de la piel, como la axila o la ingle. Los papilomas cutneos no son peligrosos y no se transmiten de Neomia Dear persona a Theodoro Clock (no son contagiosos). Puede  tener un solo papiloma cutneo o varios. Los papilomas cutneos no requieren TEFL teacher. Sin embargo, el mdico puede recomendarle que se lo quite si:  Se irrita con el roce de la ropa.  Sangra.  Se ve y tiene un aspecto antiesttico.  El mdico puede quitar el papiloma cutneo mediante un procedimiento quirrgico simple o mediante un procedimiento que implica el congelamiento del papiloma. Siga estas indicaciones en su casa:  Controle el papiloma para detectar cualquier cambio. Un papiloma cutneo normal no requiere ningn otro cuidado especial en el hogar.  Tome los medicamentos de venta libre y los recetados solamente como se lo haya indicado el mdico.  Oceanographer a todas las visitas de control como se lo haya indicado el mdico. Esto es importante. Comunquese con un mdico si:  Tiene un papiloma cutneo que: ? Duele. ? Cambia de color. ? Sangra. ? Se hincha.  Le aparecen ms papilomas cutneos. Esta informacin no tiene Theme park manager el consejo del mdico. Asegrese de hacerle al mdico cualquier pregunta que tenga. Document Released: 10/30/2016 Document Revised: 10/30/2016 Document Reviewed: 07/02/2015 Elsevier Interactive Patient Education  2018 [employer].

## 2018-05-11 NOTE — Progress Notes (Signed)
Subjective:  Patient ID: Fernando Saunders, male    DOB: 1941/03/05  Age: 77 y.o. MRN: [id number]  CC: Mass (pt c/o of wart on right side under scrotum/buttocks area for many years but it has gotten bigger.  pt c/o throbbing and stinging.)   HPI Fernando Saunders presents for evaluation and treatment of a lesion on his right inner thigh.  Over many years and is gradually increased in size.  It is occasionally uncomfortable.  He is accompanied by his granddaughter who is translating for Fernando Saunders.  Outpatient Medications Prior to Visit  Medication Sig Dispense Refill  . celecoxib (CELEBREX) 200 MG capsule Take 1 capsule (200 mg total) by mouth daily as needed (with food.). 30 capsule 0  . fluticasone (FLONASE) 50 MCG/ACT nasal spray Place 2 sprays into both nostrils daily. 16 g 6  . lisinopril (PRINIVIL,ZESTRIL) 10 MG tablet Take 1 tablet (10 mg total) by mouth daily. 90 tablet 3  . mirabegron ER (MYRBETRIQ) 25 MG TB24 tablet Take 1 tablet (25 mg total) by mouth daily. 30 tablet 0  . Multiple Vitamin (MULTIVITAMIN) tablet Take 1 tablet by mouth daily.    . pravastatin (PRAVACHOL) 40 MG tablet Take 1 tablet (40 mg total) by mouth every evening. 90 tablet 3  . aspirin 81 MG tablet Take 81 mg by mouth daily.     No facility-administered medications prior to visit.     ROS Review of Systems  Constitutional: Negative.   Respiratory: Negative.   Cardiovascular: Negative.   Gastrointestinal: Negative.   Skin: Negative for color change, pallor and rash.  Hematological: Negative.   Psychiatric/Behavioral: Negative.     Objective:  BP 98/70 (BP Location: Left Arm, Patient Position: Sitting, Cuff Size: Normal)   Pulse (!) 53   Temp 98.2 F (36.8 C) (Oral)   Ht 5\' 11"  (1.803 m)   Wt 176 lb 9.6 oz (80.1 kg)   SpO2 96%   BMI 24.63 kg/m   BP Readings from Last 3 Encounters:  05/11/18 98/70  12/08/17 118/76  08/04/17 (!) 142/84    Wt Readings from Last 3 Encounters:  05/11/18 176 lb 9.6 oz  (80.1 kg)  12/08/17 170 lb 2 oz (77.2 kg)  08/04/17 186 lb (84.4 kg)    Physical Exam  Constitutional: He is oriented to person, place, and time. He appears well-developed and well-nourished. No distress.  HENT:  Head: Normocephalic and atraumatic.  Right Ear: External ear normal.  Left Ear: External ear normal.  Eyes: Right eye exhibits no discharge. Left eye exhibits no discharge. No scleral icterus.  Neck: No JVD present. No tracheal deviation present.  Pulmonary/Chest: Effort normal.  Neurological: He is alert and oriented to person, place, and time.  Skin: Skin is warm and dry. He is not diaphoretic.     Psychiatric: He has a normal mood and affect. His behavior is normal.    Lab Results  Component Value Date   WBC 7.6 08/04/2017   HGB 13.6 08/04/2017   HCT 41.1 08/04/2017   PLT 222.0 08/04/2017   GLUCOSE 96 12/08/2017   CHOL 182 12/08/2017   TRIG 164.0 (H) 12/08/2017   HDL 27.20 (L) 12/08/2017   LDLDIRECT 106.0 08/04/2017   LDLCALC 122 (H) 12/08/2017   ALT 10 12/08/2017   AST 14 12/08/2017   NA 140 12/08/2017   K 5.2 (H) 12/08/2017   CL 104 12/08/2017   CREATININE 1.18 12/08/2017   BUN 20 12/08/2017   CO2 31 12/08/2017   TSH 3.75  08/04/2017   PSA 1.13 08/08/2014    Ct Abdomen Pelvis Wo Contrast  Result Date: 05/07/2012 *RADIOLOGY REPORT* Clinical Data: Lumbar pain CT ABDOMEN AND PELVIS WITHOUT CONTRAST Technique:  Multidetector CT imaging of the abdomen and pelvis was performed following the standard protocol without intravenous contrast. Comparison: 10/22/2011 L-spine radiograph Findings: Limited images through the lung bases demonstrate no significant appreciable abnormality. The heart size is within normal limits. No pleural or pericardial effusion.  Coronary artery calcification. Organ abnormality/lesion detection is limited in the absence of intravenous contrast. Within this limitation,  unremarkable liver, biliary system, spleen, pancreas, adrenal glands.  Mildly lobular renal contours.  No hydronephrosis or hydroureter. No bowel obstruction.  No CT evidence for colitis.  Appendix within normal limits.  No free intraperitoneal air or fluid. There is scattered atherosclerotic calcification of the aorta and its branches. No aneurysmal dilatation. Thin-walled bladder.  Mild prostatomegaly. Multilevel degenerative changes of the imaged spine. No acute or aggressive appearing osseous lesion. IMPRESSION: No acute abdominopelvic process identified by unenhanced CT. Coronary artery calcification and atherosclerotic disease of the aorta and branch vessels. Multilevel degenerative changes of the lumbar spine without acute osseous finding. Mild prostatomegaly. Original Report Authenticated By: Fernando Saunders, M.D.    Assessment & Plan:   Fernando Saunders was seen today for mass.  Diagnoses and all orders for this visit:  Cutaneous skin tags   I am having Fernando Saunders maintain his multivitamin, aspirin, fluticasone, mirabegron ER, celecoxib, lisinopril, and pravastatin.  No orders of the defined types were placed in this encounter.  Patient was given anticipatory guidance in Spanish for skin tags.  He will schedule an appointment for shave excision of this lesion.  Follow-up: Return pt will schedule patient for skin tag shave.Fernando Sax, MD

## 2018-05-19 ENCOUNTER — Encounter: Payer: Self-pay | Admitting: Family Medicine

## 2018-05-19 ENCOUNTER — Other Ambulatory Visit: Payer: Self-pay

## 2018-05-19 ENCOUNTER — Ambulatory Visit (INDEPENDENT_AMBULATORY_CARE_PROVIDER_SITE_OTHER): Payer: Medicare Other | Admitting: Family Medicine

## 2018-05-19 VITALS — BP 122/70 | HR 69 | Ht 71.0 in | Wt 176.0 lb

## 2018-05-19 DIAGNOSIS — L918 Other hypertrophic disorders of the skin: Secondary | ICD-10-CM | POA: Diagnosis not present

## 2018-05-19 DIAGNOSIS — I10 Essential (primary) hypertension: Secondary | ICD-10-CM

## 2018-05-19 DIAGNOSIS — E785 Hyperlipidemia, unspecified: Secondary | ICD-10-CM

## 2018-05-19 NOTE — Patient Instructions (Signed)
Escisin de lesiones en la piel, cuidados posteriores (Excision of Skin Lesions, Care After) Siga estas instrucciones durante las prximas semanas. Estas indicaciones le proporcionan informacin acerca de cmo deber cuidarse despus del procedimiento. El mdico tambin podr darle instrucciones ms especficas. El tratamiento ha sido planificado segn las prcticas mdicas actuales, pero en algunos casos pueden ocurrir problemas. Comunquese con el mdico si tiene algn problema o dudas despus del procedimiento. QU ESPERAR DESPUS DEL PROCEDIMIENTO Despus del procedimiento, es comn tener dolor o Social workermolestias en el lugar de la escisin. INSTRUCCIONES PARA EL CUIDADO EN EL HOGAR  Tome los medicamentos de venta libre y los recetados solamente como se lo haya indicado el mdico.  Siga las indicaciones del mdico acerca de lo siguiente: ? Cmo Firefightercuidar el lugar de la escisin. Debe mantener Immunologistel lugar limpio, seco y protegido durante al menos 48horas. ? Cmo y cundo cambiar las vendas (vendaje). ? Cundo retirar el vendaje. ? Quitar lo que sea que se haya usado para Psychologist, educationalcerrar el lugar de la escisin.  Controle todos los das la zona de la escisin para detectar signos de infeccin. Est atento a lo siguiente: ? Dolor, hinchazn o enrojecimiento. ? Lquido, sangre o pus.  En caso de hemorragia, presione la herida durante 20minutos de forma suave pero firme con una toalla doblada.  Evite las actividades y los ejercicios de alto impacto hasta que le quiten los puntos (suturas) o hasta que la zona cicatrice.  Siga las indicaciones del mdico acerca de cmo minimizar la formacin de cicatrices. Evite la exposicin al sol hasta que el rea se haya cicatrizado. Las cicatrices deben reducirse con el transcurso del Monroetiempo.  Concurra a todas las visitas de control como se lo haya indicado el mdico. Esto es importante. SOLICITE ATENCIN MDICA SI:  Lance Mussiene fiebre.  Tiene enrojecimiento, hinchazn o Product/process development scientistdolor  en el lugar de la escisin.  Tiene lquido, sangre o pus que emanan del lugar de la escisin.  Tiene Brewing technologistsangrado permanente en el lugar de la escisin.  Tiene dolor, y este no mejora despus de 2 o 3das del procedimiento.  Observa irregularidades en la piel o cambios en la sensibilidad. Esta informacin no tiene Theme park managercomo fin reemplazar el consejo del mdico. Asegrese de hacerle al mdico cualquier pregunta que tenga. Document Released: 03/08/2015 Document Revised: 03/08/2015 Document Reviewed: 08/03/2014 Elsevier Interactive Patient Education  2018 [employer]

## 2018-05-19 NOTE — Progress Notes (Signed)
Subjective:  Patient ID: Fernando Saunders, male    DOB: December 14, 1940  Age: 77 y.o. MRN: [id number]  CC: skin tag removal   HPI Fernando Saunders presents for shave biopsy of skin tag of right inner thigh.  Outpatient Medications Prior to Visit  Medication Sig Dispense Refill  . aspirin 81 MG tablet Take 81 mg by mouth daily.    . celecoxib (CELEBREX) 200 MG capsule Take 1 capsule (200 mg total) by mouth daily as needed (with food.). 30 capsule 0  . fluticasone (FLONASE) 50 MCG/ACT nasal spray Place 2 sprays into both nostrils daily. 16 g 6  . lisinopril (PRINIVIL,ZESTRIL) 10 MG tablet Take 1 tablet (10 mg total) by mouth daily. 90 tablet 3  . mirabegron ER (MYRBETRIQ) 25 MG TB24 tablet Take 1 tablet (25 mg total) by mouth daily. 30 tablet 0  . Multiple Vitamin (MULTIVITAMIN) tablet Take 1 tablet by mouth daily.    . pravastatin (PRAVACHOL) 40 MG tablet Take 1 tablet (40 mg total) by mouth every evening. 90 tablet 3   No facility-administered medications prior to visit.     ROS Review of Systems  Constitutional: Negative for diaphoresis, fatigue, fever and unexpected weight change.  Respiratory: Negative.   Cardiovascular: Negative.   Gastrointestinal: Negative.   Skin: Positive for color change.  Hematological: Does not bruise/bleed easily.  Psychiatric/Behavioral: Negative.     Objective:  BP 122/70 (BP Location: Left Arm, Patient Position: Sitting, Cuff Size: Normal)   Pulse 69   Ht 5\' 11"  (1.803 m)   Wt 176 lb (79.8 kg)   SpO2 98%   BMI 24.55 kg/m   BP Readings from Last 3 Encounters:  05/19/18 122/70  05/11/18 98/70  12/08/17 118/76    Wt Readings from Last 3 Encounters:  05/19/18 176 lb (79.8 kg)  05/11/18 176 lb 9.6 oz (80.1 kg)  12/08/17 170 lb 2 oz (77.2 kg)    Physical Exam  Constitutional: Fernando Saunders is oriented to person, place, and time. Fernando Saunders appears well-developed and well-nourished. No distress.  HENT:  Head: Normocephalic and atraumatic.  Right Ear: External  ear normal.  Left Ear: External ear normal.  Pulmonary/Chest: Effort normal.  Neurological: Fernando Saunders is alert and oriented to person, place, and time.  Skin: Skin is warm and dry. Capillary refill takes less than 2 seconds. Fernando Saunders is not diaphoretic.     Shave Biopsy Procedure Note  Pre-operative Diagnosis: skin tag  Post-operative Diagnosis: normal  Locations:proximal thigh  Indications: irritating patient  Anesthesia: Lidocaine 1% with epinephrine without added sodium bicarbonate x 2 cc.  Procedure Details  History of allergy to iodine: no  Patient informed of the risks (including bleeding and infection) and benefits of the  procedure and Verbal informed consent obtained.  The lesion and surrounding area were given a sterile prep using alcohol and draped in the usual sterile fashion. A scalpel was used to shave an area of skin approximately 1cm by 1cm.  Hemostasis achieved with silver nitrate. Antibiotic ointment and a sterile dressing applied.  The specimen was sent for pathologic examination. The patient tolerated the procedure well.  EBL: 0 ml  Findings: Skin tag   Condition: Stable  Complications: none.  Plan: 1. Instructed to keep the wound dry and covered for 24-48h and clean thereafter. 2. Warning signs of infection were reviewed.   3. Recommended that the patient use OTC acetaminophen as needed for pain.  4. Return in 7 days.  Lab Results  Component Value Date   WBC  7.6 08/04/2017   HGB 13.6 08/04/2017   HCT 41.1 08/04/2017   PLT 222.0 08/04/2017   GLUCOSE 96 12/08/2017   CHOL 182 12/08/2017   TRIG 164.0 (H) 12/08/2017   HDL 27.20 (L) 12/08/2017   LDLDIRECT 106.0 08/04/2017   LDLCALC 122 (H) 12/08/2017   ALT 10 12/08/2017   AST 14 12/08/2017   NA 140 12/08/2017   K 5.2 (H) 12/08/2017   CL 104 12/08/2017   CREATININE 1.18 12/08/2017   BUN 20 12/08/2017   CO2 31 12/08/2017   TSH 3.75 08/04/2017   PSA 1.13 08/08/2014    Ct Abdomen Pelvis Wo  Contrast  Result Date: 05/07/2012 *RADIOLOGY REPORT* Clinical Data: Lumbar pain CT ABDOMEN AND PELVIS WITHOUT CONTRAST Technique:  Multidetector CT imaging of the abdomen and pelvis was performed following the standard protocol without intravenous contrast. Comparison: 10/22/2011 L-spine radiograph Findings: Limited images through the lung bases demonstrate no significant appreciable abnormality. The heart size is within normal limits. No pleural or pericardial effusion.  Coronary artery calcification. Organ abnormality/lesion detection is limited in the absence of intravenous contrast. Within this limitation,  unremarkable liver, biliary system, spleen, pancreas, adrenal glands. Mildly lobular renal contours.  No hydronephrosis or hydroureter. No bowel obstruction.  No CT evidence for colitis.  Appendix within normal limits.  No free intraperitoneal air or fluid. There is scattered atherosclerotic calcification of the aorta and its branches. No aneurysmal dilatation. Thin-walled bladder.  Mild prostatomegaly. Multilevel degenerative changes of the imaged spine. No acute or aggressive appearing osseous lesion. IMPRESSION: No acute abdominopelvic process identified by unenhanced CT. Coronary artery calcification and atherosclerotic disease of the aorta and branch vessels. Multilevel degenerative changes of the lumbar spine without acute osseous finding. Mild prostatomegaly. Original Report Authenticated By: Jearld LeschAndrew  DelGaizo, M.D.    Assessment & Plan:   Fernando Saunders was seen today for skin tag removal.  Diagnoses and all orders for this visit:  Cutaneous skin tags   I am having Fernando Saunders maintain his multivitamin, aspirin, fluticasone, mirabegron ER, celecoxib, lisinopril, and pravastatin.  No orders of the defined types were placed in this encounter.    Follow-up: No follow-ups on file.  Fernando SaxWilliam Alfred Kremer, MD

## 2018-05-21 LAB — TISSUE SPECIMEN

## 2018-05-21 LAB — PATHOLOGY

## 2018-05-26 ENCOUNTER — Ambulatory Visit (INDEPENDENT_AMBULATORY_CARE_PROVIDER_SITE_OTHER): Payer: Medicare Other | Admitting: Family Medicine

## 2018-05-26 ENCOUNTER — Encounter: Payer: Self-pay | Admitting: Family Medicine

## 2018-05-26 VITALS — BP 110/70 | HR 70 | Ht 71.0 in | Wt 178.0 lb

## 2018-05-26 DIAGNOSIS — R140 Abdominal distension (gaseous): Secondary | ICD-10-CM | POA: Insufficient documentation

## 2018-05-26 MED ORDER — SIMETHICONE 80 MG PO CHEW: 80.0000 mg | CHEWABLE_TABLET | Freq: Four times a day (QID) | ORAL | 0 refills | Status: DC | PRN

## 2018-05-26 NOTE — Patient Instructions (Signed)
Dolor abdominal en los adultos °Abdominal Pain, Adult °El dolor de estómago (abdominal) puede tener muchas causas. La mayoría de las veces, el dolor de estómago no es peligroso. Muchos de estos casos de dolor de estómago pueden controlarse y tratarse en casa. Sin embargo, a veces, el dolor de estómago puede ser grave. El médico intentará descubrir la causa del dolor de estómago. °Siga estas indicaciones en su casa: °· Tome los medicamentos de venta libre y los recetados solamente como se lo haya indicado el médico. No tome medicamentos que lo ayuden a defecar (laxantes), salvo que el médico se lo indique. °· Beba suficiente líquido para mantener el pis (orina) claro o de color amarillo pálido. °· Esté atento al dolor de estómago para detectar cualquier cambio. °· Concurra a todas las visitas de control como se lo haya indicado el médico. Esto es importante. °Comuníquese con un médico si: °· El dolor de estómago cambia o empeora. °· No tiene apetito o baja de peso sin proponérselo. °· Tiene dificultades para defecar (está estreñido) o heces líquidas (diarrea) durante más de 2 o 3 días. °· Siente dolor al orinar o defecar. °· El dolor de estómago lo despierta de noche. °· El dolor empeora con las comidas, después de comer o con determinados alimentos. °· Vomita y no puede retener nada de lo que ingiere. °· Tiene fiebre. °Solicite ayuda de inmediato si: °· El dolor no desaparece en el tiempo indicado por el médico. °· No puede detener los vómitos. °· Siente dolor solamente en zonas específicas del abdomen, como el lado derecho o la parte inferior izquierda. °· Tiene heces con sangre, de color negro o con aspecto alquitranado. °· Tiene dolor muy intenso en el vientre, cólicos o meteorismo. °· Presenta signos de no tener suficientes líquidos o agua en el cuerpo (deshidratación), por ejemplo: °? La orina es oscura, es muy escasa o no orina. °? Labios agrietados. °? Boca seca. °? Ojos  hundidos. °? Somnolencia. °? Debilidad. °Esta información no tiene como fin reemplazar el consejo del médico. Asegúrese de hacerle al médico cualquier pregunta que tenga. °Document Released: 09/13/2008 Document Revised: 09/11/2016 Document Reviewed: 11/29/2015 °Elsevier Interactive Patient Education © 2018 [employer] ° °

## 2018-05-26 NOTE — Progress Notes (Signed)
Established Patient Office Visit  Subjective:  Patient ID: Fernando Saunders, male    DOB: [birth year]  Age: 77 y.o. MRN: [id number]  CC:  Chief Complaint  Patient presents with  . Follow-up    HPI [facility] presents for follow up after skin tag removal.  He has had no issues with the surgical wound.  Seems to be healing well.  He has another issue today and ongoing history of abdominal bloating after eating or drinking anything.  He does not associated with specific foods particularly dairy products.  He denies any reflux type symptoms or burning up into his chest.  He does report increased burping and flatulence.  There is been no weight loss night sweats fevers or chills.  He is moving his bowels regularly.  He did have a colonoscopy in the last year.  Chart review did show US abdominal CT obtained back in 2014.  No acute abdominal pathology was noted.  Past Medical History:  Diagnosis Date  . Arthritis   . Diverticulosis   . GERD (gastroesophageal reflux disease)   . Hyperlipidemia     Past Surgical History:  Procedure Laterality Date  . COLONOSCOPY  11/04/2011   Dr. Stan Head  . EYE SURGERY      Family History  Problem Relation Age of Onset  . Diabetes Mother   . Cancer Sister   . Colon cancer Neg Hx   . Stomach cancer Neg Hx   . Heart attack Neg Hx     Social History   Socioeconomic History  . Marital status: Married    Spouse name: Not on file  . Number of children: 2  . Years of education: Not on file  . Highest education level: Not on file  Occupational History  . Occupation: Unemployed  Social Needs  . Financial resource strain: Not on file  . Food insecurity:    Worry: Not on file    Inability: Not on file  . Transportation needs:    Medical: Not on file    Non-medical: Not on file  Tobacco Use  . Smoking status: Never Smoker  . Smokeless tobacco: Never Used  Substance and Sexual Activity  . Alcohol use: Yes    Alcohol/week: 0.0 standard  drinks    Comment: occasional  . Drug use: No  . Sexual activity: Never  Lifestyle  . Physical activity:    Days per week: Not on file    Minutes per session: Not on file  . Stress: Not on file  Relationships  . Social connections:    Talks on phone: Not on file    Gets together: Not on file    Attends religious service: Not on file    Active member of club or organization: Not on file    Attends meetings of clubs or organizations: Not on file    Relationship status: Not on file  . Intimate partner violence:    Fear of current or ex partner: Not on file    Emotionally abused: Not on file    Physically abused: Not on file    Forced sexual activity: Not on file  Other Topics Concern  . Not on file  Social History Narrative  . Not on file    Outpatient Medications Prior to Visit  Medication Sig Dispense Refill  . aspirin 81 MG tablet Take 81 mg by mouth daily.    . celecoxib (CELEBREX) 200 MG capsule Take 1 capsule (200 mg total) by mouth daily  as needed (with food.). 30 capsule 0  . fluticasone (FLONASE) 50 MCG/ACT nasal spray Place 2 sprays into both nostrils daily. 16 g 6  . lisinopril (PRINIVIL,ZESTRIL) 10 MG tablet Take 1 tablet (10 mg total) by mouth daily. 90 tablet 3  . Multiple Vitamin (MULTIVITAMIN) tablet Take 1 tablet by mouth daily.    Marland Kitchen MYRBETRIQ 50 MG TB24 tablet Take 1 tablet by mouth daily.    . pravastatin (PRAVACHOL) 40 MG tablet Take 1 tablet (40 mg total) by mouth every evening. 90 tablet 3  . mirabegron ER (MYRBETRIQ) 25 MG TB24 tablet Take 1 tablet (25 mg total) by mouth daily. 30 tablet 0   No facility-administered medications prior to visit.     No Known Allergies  ROS Review of Systems  Constitutional: Negative for chills, diaphoresis, fatigue, fever and unexpected weight change.  HENT: Negative.   Eyes: Negative for photophobia and visual disturbance.  Respiratory: Negative.   Cardiovascular: Negative.   Gastrointestinal: Positive for  abdominal distention. Negative for abdominal pain, anal bleeding, blood in stool, constipation, nausea and rectal pain.  Endocrine: Negative for polyphagia and polyuria.  Genitourinary: Negative.   Skin: Positive for wound. Negative for color change.  Allergic/Immunologic: Negative for immunocompromised state.  Neurological: Negative for light-headedness and numbness.  Hematological: Does not bruise/bleed easily.  Psychiatric/Behavioral: Negative.       Objective:    Physical Exam  Constitutional: He is oriented to person, place, and time. He appears well-developed and well-nourished. No distress.  HENT:  Head: Normocephalic and atraumatic.  Right Ear: External ear normal.  Left Ear: External ear normal.  Mouth/Throat: Oropharynx is clear and moist. No oropharyngeal exudate.  Eyes: Pupils are equal, round, and reactive to light. Right eye exhibits no discharge. Left eye exhibits no discharge. No scleral icterus.  Neck: Neck supple. No JVD present. No tracheal deviation present. No thyromegaly present.  Cardiovascular: Normal rate, regular rhythm and normal heart sounds.  Pulmonary/Chest: Effort normal and breath sounds normal. No stridor.  Abdominal: He exhibits distension. There is no tenderness. There is no rebound and no guarding.  Lymphadenopathy:    He has no cervical adenopathy.  Neurological: He is alert and oriented to person, place, and time.  Skin: Skin is warm and dry. He is not diaphoretic.     Psychiatric: He has a normal mood and affect. His behavior is normal.    BP 110/70   Pulse 70   Ht 5\' 11"  (1.803 m)   Wt 178 lb (80.7 kg)   SpO2 97%   BMI 24.83 kg/m  Wt Readings from Last 3 Encounters:  05/26/18 178 lb (80.7 kg)  05/19/18 176 lb (79.8 kg)  05/11/18 176 lb 9.6 oz (80.1 kg)   BP Readings from Last 3 Encounters:  05/26/18 110/70  05/19/18 122/70  05/11/18 98/70   There are no preventive care reminders to display for this patient.  There are no  preventive care reminders to display for this patient.  Lab Results  Component Value Date   TSH 3.75 08/04/2017   Lab Results  Component Value Date   WBC 7.6 08/04/2017   HGB 13.6 08/04/2017   HCT 41.1 08/04/2017   MCV 97.4 08/04/2017   PLT 222.0 08/04/2017   Lab Results  Component Value Date   NA 140 12/08/2017   K 5.2 (H) 12/08/2017   CO2 31 12/08/2017   GLUCOSE 96 12/08/2017   BUN 20 12/08/2017   CREATININE 1.18 12/08/2017   BILITOT 0.7 12/08/2017  ALKPHOS 77 12/08/2017   AST 14 12/08/2017   ALT 10 12/08/2017   PROT 7.0 12/08/2017   ALBUMIN 4.1 12/08/2017   CALCIUM 9.5 12/08/2017   GFR 64.04 12/08/2017   Lab Results  Component Value Date   CHOL 182 12/08/2017   Lab Results  Component Value Date   HDL 27.20 (L) 12/08/2017   Lab Results  Component Value Date   LDLCALC 122 (H) 12/08/2017   Lab Results  Component Value Date   TRIG 164.0 (H) 12/08/2017   Lab Results  Component Value Date   CHOLHDL 7 12/08/2017   No results found for: HGBA1C    Assessment & Plan:   Problem List Items Addressed This Visit      Other   Abdominal bloating - Primary   Relevant Medications   simethicone (GAS-X) 80 MG chewable tablet   Other Relevant Orders   CBC   Comprehensive metabolic panel   Amylase      Meds ordered this encounter  Medications  . simethicone (GAS-X) 80 MG chewable tablet    Sig: Chew 1 tablet (80 mg total) by mouth every 6 (six) hours as needed for flatulence.    Dispense:  30 tablet    Refill:  0  We will try Gas-X.  He is due for routine follow-up next month.  Anticipatory guidance was given to him on abdominal pain written in Spanish.  Follow-up: Return in about 4 weeks (around 06/23/2018).

## 2018-05-27 LAB — COMPREHENSIVE METABOLIC PANEL
ALT: 15 U/L (ref 0–53)
AST: 18 U/L (ref 0–37)
Albumin: 4.4 g/dL (ref 3.5–5.2)
Alkaline Phosphatase: 78 U/L (ref 39–117)
BUN: 23 mg/dL (ref 6–23)
CO2: 27 mEq/L (ref 19–32)
Calcium: 9.5 mg/dL (ref 8.4–10.5)
Chloride: 103 mEq/L (ref 96–112)
Creatinine, Ser: 1.13 mg/dL (ref 0.40–1.50)
GFR: 67.23 mL/min (ref >60.00)
Glucose, Bld: 91 mg/dL (ref 70–99)
Potassium: 4.4 mEq/L (ref 3.5–5.1)
Sodium: 138 mEq/L (ref 135–145)
Total Bilirubin: 0.5 mg/dL (ref 0.2–1.2)
Total Protein: 7.4 g/dL (ref 6.0–8.3)

## 2018-05-27 LAB — CBC
HCT: 40.4 % (ref 39.0–52.0)
Hemoglobin: 13.4 g/dL (ref 13.0–17.0)
MCHC: 33.1 g/dL (ref 30.0–36.0)
MCV: 96.5 fl (ref 78.0–100.0)
Platelets: 237 10*3/uL (ref 150.0–400.0)
RBC: 4.19 Mil/uL — ABNORMAL LOW (ref 4.22–5.81)
RDW: 12.6 % (ref 11.5–15.5)
WBC: 7 10*3/uL (ref 4.0–10.5)

## 2018-05-27 LAB — AMYLASE: Amylase: 65 U/L (ref 27–131)

## 2018-06-09 ENCOUNTER — Other Ambulatory Visit (INDEPENDENT_AMBULATORY_CARE_PROVIDER_SITE_OTHER): Payer: Medicare Other

## 2018-06-09 ENCOUNTER — Ambulatory Visit (INDEPENDENT_AMBULATORY_CARE_PROVIDER_SITE_OTHER): Payer: Medicare Other | Admitting: Family Medicine

## 2018-06-09 ENCOUNTER — Encounter: Payer: Self-pay | Admitting: Family Medicine

## 2018-06-09 ENCOUNTER — Ambulatory Visit: Payer: Medicare Other | Admitting: Family Medicine

## 2018-06-09 VITALS — BP 122/66 | HR 53 | Temp 98.4°F | Ht 71.0 in | Wt 180.0 lb

## 2018-06-09 DIAGNOSIS — E785 Hyperlipidemia, unspecified: Secondary | ICD-10-CM

## 2018-06-09 DIAGNOSIS — E782 Mixed hyperlipidemia: Secondary | ICD-10-CM | POA: Diagnosis not present

## 2018-06-09 DIAGNOSIS — I10 Essential (primary) hypertension: Secondary | ICD-10-CM

## 2018-06-09 LAB — LIPID PANEL
Cholesterol: 198 mg/dL (ref 0–200)
HDL: 33.4 mg/dL — ABNORMAL LOW (ref >39.00)
LDL Cholesterol: 129 mg/dL — ABNORMAL HIGH (ref 0–99)
NonHDL: 164.16
Total CHOL/HDL Ratio: 6
Triglycerides: 175 mg/dL — ABNORMAL HIGH (ref 0.0–149.0)
VLDL: 35 mg/dL (ref 0.0–40.0)

## 2018-06-09 LAB — COMPREHENSIVE METABOLIC PANEL
ALT: 11 U/L (ref 0–53)
AST: 17 U/L (ref 0–37)
Albumin: 4.2 g/dL (ref 3.5–5.2)
Alkaline Phosphatase: 76 U/L (ref 39–117)
BUN: 17 mg/dL (ref 6–23)
CO2: 30 mEq/L (ref 19–32)
Calcium: 9.1 mg/dL (ref 8.4–10.5)
Chloride: 103 mEq/L (ref 96–112)
Creatinine, Ser: 1.09 mg/dL (ref 0.40–1.50)
GFR: 70.08 mL/min (ref >60.00)
Glucose, Bld: 94 mg/dL (ref 70–99)
Potassium: 4 mEq/L (ref 3.5–5.1)
Sodium: 140 mEq/L (ref 135–145)
Total Bilirubin: 0.6 mg/dL (ref 0.2–1.2)
Total Protein: 7.2 g/dL (ref 6.0–8.3)

## 2018-06-09 NOTE — Progress Notes (Addendum)
Established Patient Office Visit  Subjective:  Patient ID: Fernando Saunders, male    DOB: 15-Apr-1941  Age: 77 y.o. MRN: [id number]  CC:  Chief Complaint  Patient presents with  . Follow-up    HPI Fernando Saunders presents for follow up on his cholesterol. He had his labs drawn this morning.  Patient presents with his daughter for follow-up of his elevated LDL cholesterol.  As it turns out patient has been taking his pravastatin in the morning every 3 days.  He says that I told him to do this but it is clearly written for him to take it nightly.  Past Medical History:  Diagnosis Date  . Arthritis   . Diverticulosis   . GERD (gastroesophageal reflux disease)   . Hyperlipidemia     Past Surgical History:  Procedure Laterality Date  . COLONOSCOPY  11/04/2011   Dr. Stan Headarl Gessner  . EYE SURGERY      Family History  Problem Relation Age of Onset  . Diabetes Mother   . Cancer Sister   . Colon cancer Neg Hx   . Stomach cancer Neg Hx   . Heart attack Neg Hx     Social History   Socioeconomic History  . Marital status: Married    Spouse name: Not on file  . Number of children: 2  . Years of education: Not on file  . Highest education level: Not on file  Occupational History  . Occupation: Unemployed  Social Needs  . Financial resource strain: Not on file  . Food insecurity:    Worry: Not on file    Inability: Not on file  . Transportation needs:    Medical: Not on file    Non-medical: Not on file  Tobacco Use  . Smoking status: Never Smoker  . Smokeless tobacco: Never Used  Substance and Sexual Activity  . Alcohol use: Yes    Alcohol/week: 0.0 standard drinks    Comment: occasional  . Drug use: No  . Sexual activity: Never  Lifestyle  . Physical activity:    Days per week: Not on file    Minutes per session: Not on file  . Stress: Not on file  Relationships  . Social connections:    Talks on phone: Not on file    Gets together: Not on file    Attends  religious service: Not on file    Active member of club or organization: Not on file    Attends meetings of clubs or organizations: Not on file    Relationship status: Not on file  . Intimate partner violence:    Fear of current or ex partner: Not on file    Emotionally abused: Not on file    Physically abused: Not on file    Forced sexual activity: Not on file  Other Topics Concern  . Not on file  Social History Narrative  . Not on file    Outpatient Medications Prior to Visit  Medication Sig Dispense Refill  . aspirin 81 MG tablet Take 81 mg by mouth daily.    . celecoxib (CELEBREX) 200 MG capsule Take 1 capsule (200 mg total) by mouth daily as needed (with food.). 30 capsule 0  . fluticasone (FLONASE) 50 MCG/ACT nasal spray Place 2 sprays into both nostrils daily. 16 g 6  . lisinopril (PRINIVIL,ZESTRIL) 10 MG tablet Take 1 tablet (10 mg total) by mouth daily. 90 tablet 3  . Multiple Vitamin (MULTIVITAMIN) tablet Take 1 tablet by mouth daily.    .Marland Kitchen  MYRBETRIQ 50 MG TB24 tablet Take 1 tablet by mouth daily.    . pravastatin (PRAVACHOL) 40 MG tablet Take 1 tablet (40 mg total) by mouth every evening. 90 tablet 3  . simethicone (GAS-X) 80 MG chewable tablet Chew 1 tablet (80 mg total) by mouth every 6 (six) hours as needed for flatulence. 30 tablet 0   No facility-administered medications prior to visit.     No Known Allergies  ROS Review of Systems  Constitutional: Negative.   HENT: Negative.   Respiratory: Negative.   Cardiovascular: Negative.   Gastrointestinal: Negative.   Psychiatric/Behavioral: Negative.       Objective:    Physical Exam  Constitutional: He is oriented to person, place, and time. He appears well-developed and well-nourished. No distress.  HENT:  Head: Normocephalic and atraumatic.  Right Ear: External ear normal.  Left Ear: External ear normal.  Eyes: Right eye exhibits no discharge. Left eye exhibits no discharge. No scleral icterus.    Pulmonary/Chest: Effort normal.  Neurological: He is alert and oriented to person, place, and time.  Skin: He is not diaphoretic.  Psychiatric: He has a normal mood and affect. His behavior is normal.    BP 122/66   Pulse (!) 53   Temp 98.4 F (36.9 C) (Oral)   Ht 5\' 11"  (1.803 m)   Wt 180 lb (81.6 kg)   SpO2 96%   BMI 25.10 kg/m  Wt Readings from Last 3 Encounters:  06/09/18 180 lb (81.6 kg)  05/26/18 178 lb (80.7 kg)  05/19/18 176 lb (79.8 kg)   BP Readings from Last 3 Encounters:  06/09/18 122/66  05/26/18 110/70  05/19/18 122/70   There are no preventive care reminders to display for this patient.  There are no preventive care reminders to display for this patient.  Lab Results  Component Value Date   TSH 3.75 08/04/2017   Lab Results  Component Value Date   WBC 7.0 05/26/2018   HGB 13.4 05/26/2018   HCT 40.4 05/26/2018   MCV 96.5 05/26/2018   PLT 237.0 05/26/2018   Lab Results  Component Value Date   NA 140 06/09/2018   K 4.0 06/09/2018   CO2 30 06/09/2018   GLUCOSE 94 06/09/2018   BUN 17 06/09/2018   CREATININE 1.09 06/09/2018   BILITOT 0.6 06/09/2018   ALKPHOS 76 06/09/2018   AST 17 06/09/2018   ALT 11 06/09/2018   PROT 7.2 06/09/2018   ALBUMIN 4.2 06/09/2018   CALCIUM 9.1 06/09/2018   GFR 70.08 06/09/2018   Lab Results  Component Value Date   CHOL 198 06/09/2018   Lab Results  Component Value Date   HDL 33.40 (L) 06/09/2018   Lab Results  Component Value Date   LDLCALC 129 (H) 06/09/2018   Lab Results  Component Value Date   TRIG 175.0 (H) 06/09/2018   Lab Results  Component Value Date   CHOLHDL 6 06/09/2018   No results found for: HGBA1C    Assessment & Plan:   Problem List Items Addressed This Visit    None      No orders of the defined types were placed in this encounter.   Follow-up: Return in about 3 months (around 09/08/2018), or take pravastatin nightly.   The 10-year ASCVD risk score Denman George DC Jr., et al.,  2013) is: 29.7%   Values used to calculate the score:     Age: 12 years     Sex: Male     Is Non-Hispanic  African American: No     Diabetic: No     Tobacco smoker: No     Systolic Blood Pressure: 122 mmHg     Is BP treated: Yes     HDL Cholesterol: 33.4 mg/dL     Total Cholesterol: 198 mg/dL

## 2018-06-25 ENCOUNTER — Ambulatory Visit: Payer: Medicare Other | Admitting: Family Medicine

## 2018-09-07 ENCOUNTER — Other Ambulatory Visit: Payer: Self-pay

## 2018-09-07 DIAGNOSIS — I10 Essential (primary) hypertension: Secondary | ICD-10-CM

## 2018-09-07 DIAGNOSIS — E785 Hyperlipidemia, unspecified: Secondary | ICD-10-CM

## 2018-09-08 ENCOUNTER — Ambulatory Visit: Payer: Medicare Other | Admitting: Family Medicine

## 2018-09-08 ENCOUNTER — Other Ambulatory Visit: Payer: Medicare Other

## 2019-03-04 ENCOUNTER — Other Ambulatory Visit: Payer: Self-pay | Admitting: Family Medicine

## 2019-03-04 DIAGNOSIS — I10 Essential (primary) hypertension: Secondary | ICD-10-CM

## 2019-03-04 DIAGNOSIS — E785 Hyperlipidemia, unspecified: Secondary | ICD-10-CM

## 2019-04-01 ENCOUNTER — Encounter: Payer: Medicare Other | Admitting: Family Medicine

## 2019-08-29 ENCOUNTER — Other Ambulatory Visit: Payer: Self-pay | Admitting: Family Medicine

## 2019-08-29 DIAGNOSIS — E785 Hyperlipidemia, unspecified: Secondary | ICD-10-CM

## 2019-08-29 DIAGNOSIS — I10 Essential (primary) hypertension: Secondary | ICD-10-CM

## 2019-12-02 DIAGNOSIS — R3915 Urgency of urination: Secondary | ICD-10-CM | POA: Diagnosis not present

## 2019-12-06 ENCOUNTER — Encounter: Payer: Self-pay | Admitting: Family Medicine

## 2019-12-06 ENCOUNTER — Ambulatory Visit (INDEPENDENT_AMBULATORY_CARE_PROVIDER_SITE_OTHER): Payer: Medicare Other | Admitting: Family Medicine

## 2019-12-06 ENCOUNTER — Other Ambulatory Visit: Payer: Self-pay

## 2019-12-06 VITALS — BP 122/66 | HR 71 | Temp 97.0°F | Ht 70.0 in | Wt 178.2 lb

## 2019-12-06 DIAGNOSIS — I10 Essential (primary) hypertension: Secondary | ICD-10-CM | POA: Diagnosis not present

## 2019-12-06 DIAGNOSIS — Z91199 Patient's noncompliance with other medical treatment and regimen due to unspecified reason: Secondary | ICD-10-CM

## 2019-12-06 DIAGNOSIS — E785 Hyperlipidemia, unspecified: Secondary | ICD-10-CM

## 2019-12-06 DIAGNOSIS — E782 Mixed hyperlipidemia: Secondary | ICD-10-CM

## 2019-12-06 DIAGNOSIS — L603 Nail dystrophy: Secondary | ICD-10-CM | POA: Diagnosis not present

## 2019-12-06 DIAGNOSIS — K219 Gastro-esophageal reflux disease without esophagitis: Secondary | ICD-10-CM | POA: Diagnosis not present

## 2019-12-06 DIAGNOSIS — Z9119 Patient's noncompliance with other medical treatment and regimen: Secondary | ICD-10-CM | POA: Diagnosis not present

## 2019-12-06 MED ORDER — PRAVASTATIN SODIUM 40 MG PO TABS: 40.0000 mg | ORAL_TABLET | Freq: Every evening | ORAL | 0 refills | Status: DC

## 2019-12-06 MED ORDER — OMEPRAZOLE 40 MG PO CPDR: 40.0000 mg | DELAYED_RELEASE_CAPSULE | Freq: Every day | ORAL | 3 refills | Status: DC

## 2019-12-06 MED ORDER — LISINOPRIL 10 MG PO TABS: 10.0000 mg | ORAL_TABLET | Freq: Every day | ORAL | 0 refills | Status: DC

## 2019-12-06 NOTE — Progress Notes (Addendum)
Established Patient Office Visit  Subjective:  Patient ID: Fernando Saunders, male    DOB: [birth year]  Age: 79 y.o. MRN: [id number]  CC:  Chief Complaint  Patient presents with  . Annual Exam    CPE, Patient would like to have labs, not fasting, would like referral to podiatry for nails breaking.     HPI [facility] presents for follow-up of his blood pressure, cholesterol and chest pressure that he has been experiencing.  He is accompanied by his daughter who is helping to translate.  Blood pressures been well controlled on the lisinopril.  Again he tells me that he is only taking the pravastatin 3 times a week.  We had discussed this in the last clinic visit and I had advised him to take it nightly.  He is nonfasting today.  He has been experiencing some chest pressure.  It is not related to activity or exertion.  There is no nausea, diaphoresis or shortness of breath.  He is also having a problem with his nails.  They are brittle and seem to be pulling away from the nailbed. His daughter is present today and helping [place] to translate.   Past Medical History:  Diagnosis Date  . Arthritis   . Diverticulosis   . GERD (gastroesophageal reflux disease)   . Hyperlipidemia     Past Surgical History:  Procedure Laterality Date  . COLONOSCOPY  11/04/2011   Dr. Stan Head  . EYE SURGERY      Family History  Problem Relation Age of Onset  . Diabetes Mother   . Cancer Sister   . Colon cancer Neg Hx   . Stomach cancer Neg Hx   . Heart attack Neg Hx     Social History   Socioeconomic History  . Marital status: Married    Spouse name: Not on file  . Number of children: 2  . Years of education: Not on file  . Highest education level: Not on file  Occupational History  . Occupation: Unemployed  Tobacco Use  . Smoking status: Never Smoker  . Smokeless tobacco: Never Used  Substance and Sexual Activity  . Alcohol use: Yes    Alcohol/week: 0.0 standard drinks    Comment:  occasional  . Drug use: No  . Sexual activity: Never  Other Topics Concern  . Not on file  Social History Narrative  . Not on file   Social Determinants of Health   Financial Resource Strain:   . Difficulty of Paying Living Expenses:   Food Insecurity:   . Worried About Programme researcher, broadcasting/film/video in the Last Year:   . Barista in the Last Year:   Transportation Needs:   . Freight forwarder (Medical):   Marland Kitchen Lack of Transportation (Non-Medical):   Physical Activity:   . Days of Exercise per Week:   . Minutes of Exercise per Session:   Stress:   . Feeling of Stress :   Social Connections:   . Frequency of Communication with Friends and Family:   . Frequency of Social Gatherings with Friends and Family:   . Attends Religious Services:   . Active Member of Clubs or Organizations:   . Attends Banker Meetings:   Marland Kitchen Marital Status:   Intimate Partner Violence:   . Fear of Current or Ex-Partner:   . Emotionally Abused:   Marland Kitchen Physically Abused:   . Sexually Abused:     Outpatient Medications Prior to Visit  Medication  Sig Dispense Refill  . Multiple Vitamin (MULTIVITAMIN) tablet Take 1 tablet by mouth daily.    . tamsulosin (FLOMAX) 0.4 MG CAPS capsule Take 0.4 mg by mouth daily.    Marland Kitchen lisinopril (ZESTRIL) 10 MG tablet Take 1 tablet by mouth once daily 90 tablet 0  . pravastatin (PRAVACHOL) 40 MG tablet TAKE 1 TABLET BY MOUTH ONCE DAILY IN THE EVENING 90 tablet 0  . aspirin 81 MG tablet Take 81 mg by mouth daily.    Marland Kitchen MYRBETRIQ 50 MG TB24 tablet Take 1 tablet by mouth daily.    . celecoxib (CELEBREX) 200 MG capsule Take 1 capsule (200 mg total) by mouth daily as needed (with food.). (Patient not taking: Reported on 12/06/2019) 30 capsule 0  . fluticasone (FLONASE) 50 MCG/ACT nasal spray Place 2 sprays into both nostrils daily. (Patient not taking: Reported on 12/06/2019) 16 g 6  . simethicone (GAS-X) 80 MG chewable tablet Chew 1 tablet (80 mg total) by mouth every 6  (six) hours as needed for flatulence. (Patient not taking: Reported on 12/06/2019) 30 tablet 0   No facility-administered medications prior to visit.    No Known Allergies  ROS Review of Systems  Constitutional: Negative for chills, diaphoresis, fatigue, fever and unexpected weight change.  HENT: Negative.   Eyes: Negative for photophobia and visual disturbance.  Respiratory: Negative for chest tightness, shortness of breath and wheezing.   Cardiovascular: Negative for chest pain and palpitations.  Gastrointestinal: Negative for nausea and vomiting.  Endocrine: Negative for polyphagia and polyuria.  Genitourinary: Negative.   Musculoskeletal: Negative for arthralgias and joint swelling.  Skin: Negative for pallor and rash.  Allergic/Immunologic: Negative for immunocompromised state.  Neurological: Negative for speech difficulty and numbness.  Hematological: Does not bruise/bleed easily.  Psychiatric/Behavioral: Negative.       Objective:    Physical Exam  Constitutional: He is oriented to person, place, and time. He appears well-developed and well-nourished. No distress.  HENT:  Head: Normocephalic and atraumatic.  Right Ear: External ear normal.  Left Ear: External ear normal.  Eyes: Pupils are equal, round, and reactive to light. Conjunctivae are normal. Right eye exhibits no discharge. Left eye exhibits no discharge.  Neck: No JVD present. No tracheal deviation present. No thyromegaly present.  Cardiovascular: Normal rate and normal heart sounds.  Pulmonary/Chest: Effort normal and breath sounds normal. No stridor.  Lymphadenopathy:    He has no cervical adenopathy.  Neurological: He is alert and oriented to person, place, and time.  Skin: Skin is warm and dry. He is not diaphoretic.  Psychiatric: He has a normal mood and affect. His behavior is normal.    BP 122/66   Pulse 71   Temp (!) 97 F (36.1 C) (Tympanic)   Ht 5\' 10"  (1.778 m)   Wt 178 lb 3.2 oz (80.8 kg)    SpO2 95%   BMI 25.57 kg/m  Wt Readings from Last 3 Encounters:  12/06/19 178 lb 3.2 oz (80.8 kg)  06/09/18 180 lb (81.6 kg)  05/26/18 178 lb (80.7 kg)     Health Maintenance Due  Topic Date Due  . Hepatitis C Screening  Never done  . COVID-19 Vaccine (1) Never done    There are no preventive care reminders to display for this patient.  Lab Results  Component Value Date   TSH 3.75 08/04/2017   Lab Results  Component Value Date   WBC 7.0 05/26/2018   HGB 13.4 05/26/2018   HCT 40.4 05/26/2018  MCV 96.5 05/26/2018   PLT 237.0 05/26/2018   Lab Results  Component Value Date   NA 140 06/09/2018   K 4.0 06/09/2018   CO2 30 06/09/2018   GLUCOSE 94 06/09/2018   BUN 17 06/09/2018   CREATININE 1.09 06/09/2018   BILITOT 0.6 06/09/2018   ALKPHOS 76 06/09/2018   AST 17 06/09/2018   ALT 11 06/09/2018   PROT 7.2 06/09/2018   ALBUMIN 4.2 06/09/2018   CALCIUM 9.1 06/09/2018   GFR 70.08 06/09/2018   Lab Results  Component Value Date   CHOL 198 06/09/2018   Lab Results  Component Value Date   HDL 33.40 (L) 06/09/2018   Lab Results  Component Value Date   LDLCALC 129 (H) 06/09/2018   Lab Results  Component Value Date   TRIG 175.0 (H) 06/09/2018   Lab Results  Component Value Date   CHOLHDL 6 06/09/2018   No results found for: HGBA1C    Assessment & Plan:   Problem List Items Addressed This Visit      Cardiovascular and Mediastinum   Essential hypertension - Primary   Relevant Medications   lisinopril (ZESTRIL) 10 MG tablet   pravastatin (PRAVACHOL) 40 MG tablet   Other Relevant Orders   CBC   Comprehensive metabolic panel   Urinalysis, Routine w reflex microscopic     Digestive   Gastroesophageal reflux disease   Relevant Medications   omeprazole (PRILOSEC) 40 MG capsule     Musculoskeletal and Integument   Nail dystrophy   Relevant Orders   Ambulatory referral to Dermatology     Other   Mixed hyperlipidemia   Relevant Medications    lisinopril (ZESTRIL) 10 MG tablet   pravastatin (PRAVACHOL) 40 MG tablet   Other Relevant Orders   Comprehensive metabolic panel   Lipid panel   Dyslipidemia   Relevant Medications   pravastatin (PRAVACHOL) 40 MG tablet   Medically noncompliant      Meds ordered this encounter  Medications  . DISCONTD: omeprazole (PRILOSEC) 40 MG capsule    Sig: Take 1 capsule (40 mg total) by mouth daily.    Dispense:  30 capsule    Refill:  3  . lisinopril (ZESTRIL) 10 MG tablet    Sig: Take 1 tablet (10 mg total) by mouth daily.    Dispense:  90 tablet    Refill:  0  . pravastatin (PRAVACHOL) 40 MG tablet    Sig: Take 1 tablet (40 mg total) by mouth every evening.    Dispense:  90 tablet    Refill:  0  . omeprazole (PRILOSEC) 40 MG capsule    Sig: Take 1 capsule (40 mg total) by mouth daily.    Dispense:  30 capsule    Refill:  3    Follow-up: Return in about 3 months (around 03/07/2020), or return sooner if chest pressure is not relieved with omeprazole., for and return fasting tomorrow morning for bloodwork.. Patient was given information on reflux disease and heartburn as well as preventing high cholesterol all in Spanish.  Trial of Prilosec.  If this does not relieve his chest pressure after a week and will return to clinic.  He will return tomorrow morning fasting for blood work.  Advised him to go ahead and have the Covid vaccine.   Libby Maw, MD

## 2019-12-06 NOTE — Patient Instructions (Addendum)
Enfermedad de reflujo gastroesofgico en los adultos Gastroesophageal Reflux Disease, Adult El reflujo gastroesofgico (RGE) ocurre cuando el cido del estmago sube por el tubo que conecta la boca con el estmago (esfago). Normalmente, la comida baja por el esfago y se mantiene en el estmago, donde se la digiere. Cuando una persona tiene RGE, los alimentos y el cido estomacal suelen volver al esfago. Usted puede tener una enfermedad llamada enfermedad de reflujo gastroesofgico (ERGE) si el reflujo:  Sucede a menudo.  Causa sntomas frecuentes o muy intensos.  Causa problemas tales como dao en el esfago. Cuando esto ocurre, el esfago duele y se hincha (inflama). Con el tiempo, la ERGE puede ocasionar pequeos agujeros (lceras) en el revestimiento del esfago. Cules son las causas? Esta afeccin se debe a un problema en el msculo que se encuentra entre el esfago y [place]. Cuando este msculo est dbil o no es normal, no se cierra correctamente para impedir que los alimentos y el cido regresen del Paramedic. El msculo puede debilitarse debido a lo siguiente:  El consumo de [place].  [place].  Tener cierto tipo de hernia (hernia de hiato).  Consumo de alcohol.  Ciertos alimentos y bebidas, como caf, chocolate, cebollas y [place]. Qu incrementa el riesgo? Es ms probable que tenga esta afeccin si:  Tiene sobrepeso.  Tiene una enfermedad que afecta el tejido conjuntivo.  [place] antiinflamatorios no esteroideos (AINE). Cules son los signos o los sntomas? Los sntomas de esta afeccin incluyen:  Acidez estomacal.  Dificultad o dolor al tragar.  Sensacin de Best boy un bulto en la garganta.  Sabor amargo en la boca.  Mal aliento.  Tener una gran cantidad de saliva.  Estmago inflamado o con Tree surgeon.  Eructos.  Dolor en el pecho. El dolor de pecho puede deberse a distintas afecciones. Asegrese de Teacher, adult education a su mdico si tiene Tourist information centre manager.  Falta  de aire o respiracin ruidosa (sibilancias).  Tos constante (crnica) o durante la noche.  Desgaste de la superficie de los dientes (esmalte dental).  Prdida de peso. Cmo se trata? El tratamiento depender de la gravedad de los sntomas. El mdico puede sugerirle lo siguiente:  Cambios en la dieta.  Medicamentos.  Clementeen Hoof. Siga estas indicaciones en su casa: Comida y bebida   Siga una dieta como se lo haya indicado el mdico. Es posible que deba evitar alimentos y bebidas, por ejemplo: ? Caf y t (con o sin cafena). ? Bebidas que contengan alcohol. ? Bebidas energticas y deportivas. ? Bebidas gaseosas y refrescos. ? Chocolate y cacao. ? Menta y [place]. ? Ajo y cebolla. ? Rbano picante. ? Alimentos cidos y condimentados. Estos incluyen todos los tipos de pimientos, [place] en polvo, curry en polvo, vinagre, salsas picantes y [employer]. ? Ctricos y sus jugos, por ejemplo, naranjas, limones y limas. ? Alimentos que [employer]. Estos incluyen salsa roja, [place], salsa picante y pizza con salsa de [place]. ? Alimentos fritos y Radio broadcast assistant. Estos incluyen donas, papas fritas, papitas fritas de bolsa y aderezos con alto contenido de [place]. ? Carnes con alto contenido de [place]. Estas incluye los perros calientes, chuletas o costillas, embutidos, jamn y tocino. ? Productos lcteos ricos en grasas, como leche [place], [place] y [place] crema.  Consuma pequeas cantidades de comida con ms frecuencia. Evite consumir porciones abundantes.  Evite beber grandes cantidades de lquidos con las comidas.  Evite comer 2 o 3horas antes de acostarse.  Evite recostarse inmediatamente despus de comer.  No haga ejercicios  enseguida despus de comer. Estilo de vida   No consuma ningn producto que contenga nicotina o tabaco. Estos incluyen cigarrillos, cigarrillos electrnicos y tabaco para Higher education careers adviser. Si necesita ayuda para dejar de fumar, consulte al [employer].  Intente  reducir [employer] de estrs. Si necesita ayuda para hacer esto, consulte al mdico.  Si tiene sobrepeso, baje una cantidad de peso saludable para usted. Consulte a su mdico para bajar de peso de [employer]. Indicaciones generales  Est atento a cualquier cambio en los sntomas.  Tome los medicamentos de venta libre y los recetados solamente como se lo haya indicado el mdico. No tome aspirina, ibuprofeno ni otros AINE a menos que el mdico lo autorice.  Use ropa holgada. No use nada apretado alrededor de la cintura.  Levante (eleve) la cabecera de la cama aproximadamente 6pulgadas (15cm).  Evite inclinarse si al hacerlo empeoran los sntomas.  Concurra a todas las visitas de seguimiento como se lo haya indicado el mdico. Esto es importante. Comunquese con un mdico si:  Aparecen nuevos sntomas.  Adelgaza y no sabe por qu.  Tiene problemas para tragar o le duele cuando traga.  Tiene sibilancias o tos persistente.  Los sntomas no mejoran con Dispensing optician.  Tiene la voz ronca. Solicite ayuda inmediatamente si:  [employer], el cuello, la [place], los dientes o la espalda.  Se siente transpirado, mareado o tiene una sensacin de desvanecimiento.  Siente falta de aire o Tourist information centre manager.  Vomita y el vmito tiene un aspecto similar a la sangre o a los posos de caf.  Pierde el conocimiento (se desmaya).  Las deposiciones (heces) son sanguinolentas o negras.  No puede tragar, beber o comer. Resumen  Si una persona tiene enfermedad de reflujo gastroesofgico (ERGE), los alimentos y el cido estomacal suben al esfago y causan sntomas o problemas tales como dao en el esfago.  El tratamiento depender de la gravedad de los sntomas.  Siga una Lexicographer se lo haya indicado el [place] todos los medicamentos solamente como se lo haya indicado el mdico. Esta informacin no tiene Marine scientist el consejo del mdico. Asegrese de  hacerle al mdico cualquier pregunta que tenga. Document Revised: 01/29/2018 Document Reviewed: 01/29/2018 Elsevier Patient Education  [place] colesterol alto Preventing High Cholesterol El colesterol es una sustancia cerosa parecida a la grasa que el organismo necesita en pequeas cantidades. El hgado fabrica todo el colesterol que el cuerpo necesita. Tener el colesterol alto (hipercolesterolemia) aumenta el riesgo de sufrir enfermedades cardacas y accidentes cerebrovasculares. El colesterol extra (exceso de colesterol) proviene de los alimentos que come, por ejemplo grasas de origen animal (grasas saturadas) de la carne y de algunos productos lcteos. El colesterol alto con frecuencia puede prevenirse con cambios en la dieta y en el estilo de vida. Si ya tiene Orthoptist, puede controlarlo haciendo cambios en la dieta y en el estilo de vida, adems de con medicamentos. Qu cambios en la alimentacin se pueden hacer?  Coma menos grasas saturadas. Los alimentos que contienen grasas saturadas incluyen las carnes rojas y algunos productos lcteos.  Evite las carnes procesadas, como el tocino, los fiambres y embutidos.  Evite las grasas trans, que se encuentran en la margarina y en algunos productos horneados.  Evite alimentos y bebidas que tengan azcares agregados.  Consuma ms frutas, verduras y cereales integrales.  Elija fuentes saludables de protenas, como el pescado, la carne de ave y los frutos secos.  Elija fuentes saludables de grasas, por ejemplo: ? Frutos secos. ? Aceites vegetales, en particular el aceite de oliva. ? Pescados que contengan grasas saludables (cidos grasos omega-3), como la caballa o el salmn. Qu cambios en el estilo de vida se pueden realizar?   Baje de peso si es necesario. Bajar entre 5 y 10lb (2,3 a 4,5kg) puede ayudar a prevenir o Public house manager alto y a Software engineer de padecer diabetes y presin  arterial alta (hipertensin). Pdale al mdico que le recomiende una dieta y un plan de ejercicios para bajar de peso de forma segura.  Ejerctese lo suficiente. Debe realizar al menos de ejercicios de intensidad moderada todas las semanas. ? Theatre stage manager en sesiones cortas de ejercicios, varias veces al da, o puede realizar sesiones ms largas, pero menos veces por semana. Por ejemplo, puede realizar una caminata enrgica o andar en bicicleta durante , 3veces al da, durante 5das a la semana.  No fume. Si necesita ayuda para dejar de fumar, consulte al mdico.  Limite el consumo de bebidas alcohlicas. Si bebe alcohol, limite el consumo a no ms de por da si es mujer y no est [place], y por da si es hombre. Una medida equivale a 12onzas de cerveza, 5onzas de vino o 1onzas de bebidas alcohlicas de alta graduacin. Por qu son importantes estos cambios?  Si tiene Engineer, production, se pueden acumular depsitos de esta sustancia (placa) en las paredes de los vasos sanguneos. La placa hace que las arterias se vuelvan ms estrechas y rgidas, lo que puede limitar u obstruir la circulacin sangunea y Development worker, international aid la formacin de cogulos de [place]. Esto aumenta en gran medida el riesgo de infarto de miocardio y de accidente cerebrovascular. Hacer cambios en la dieta y en el estilo de vida puede ayudar a reducir el riesgo de sufrir estas afecciones potencialmente mortales. Qu puedo hacer para reducir mis riesgos?  Controle los factores de riesgo del colesterol alto. Hable con el mdico acerca de todos los factores de riesgo y cmo reducir Nurse, adult.  Controle otras afecciones que pueda tener, por ejemplo diabetes o presin arterial alta (hipertensin).  Contrlese el colesterol a intervalos regulares.  Concurra a todas las visitas de control como se lo haya indicado el mdico. Esto es importante. Cmo se trata? Adems de los cambios  en la dieta y en el estilo de vida, el mdico puede recomendarle que tome ciertos medicamentos para reducir el colesterol, por ejemplo, medicamentos que reducen la cantidad de colesterol producida por el hgado. Es posible que necesite tomar medicamentos si:  No logra reducir lo suficiente el colesterol con cambios en la dieta y en el estilo de vida.  Tiene colesterol alto y presenta otros factores de riesgo de sufrir enfermedades cardacas o accidentes cerebrovasculares. Tome los medicamentos de venta libre y los recetados solamente como se lo haya indicado el mdico. Dnde encontrar ms informacin  Asociacin Estadounidense de Firefighter (Facilities manager, Psychologist, educational): [phone]  Training and development officer de Firefighter, Corporate investment banker y Teacher, English as a foreign language (Armed forces training and education officer, Lung, and [employer], NHLBI): http://hood.com/ Resumen  El colesterol alto aumenta el riesgo de sufrir enfermedades cardacas y accidentes cerebrovasculares. Si mantiene el colesterol bajo, puede reducir el riesgo de tener estas afecciones.  Los [employer] dieta y en el estilo de vida son los pasos ms importantes para prevenir Nurse, learning disability.  Consulte al mdico para controlar los factores de riesgo y hgase anlisis de sangre con regularidad. Esta informacin no tiene [employer]  fin reemplazar el consejo del mdico. Asegrese de hacerle al mdico cualquier pregunta que tenga. Document Revised: 09/25/2016 Document Reviewed: 07/02/2015 Elsevier Patient Education  2020 Elsevier Inc.  Acidez estomacal Heartburn La acidez estomacal es un tipo de dolor o Dentist que puede sentirse en la garganta o en el pecho. Con frecuencia se describe como un dolor urente (ardor). Tambin puede causar mal sabor en la boca que se siente cido. La acidez estomacal puede sentirse con mayor intensidad cuando usted se  acuesta o se inclina, y a menudo [employer] noche. La acidez estomacal puede deberse al contenido del estmago que sube por el esfago (reflujo). Siga estas indicaciones en su casa: Comida y bebida   Evite ciertos alimentos y bebidas como se lo haya indicado el mdico. Estos pueden incluir: ? Caf y t (con o sin cafena). ? Bebidas que contengan alcohol. ? Bebidas energticas y deportivas. ? Bebidas gaseosas o refrescos. ? Chocolate y cacao. ? Menta y esencia de [place]. ? Ajo y cebolla. ? Rbano picante. ? Alimentos condimentados, picantes y cidos, por ejemplo, todos los tipos de pimientas, [place] en polvo, curry en polvo, vinagre, salsas picantes y Occ[employer] Petroleum. ? Ctricos y sus jugos, por ejemplo, naranjas, limones y limas. ? Alimentos a base de [address], como salsa de [place], [place], salsa picante y pizza con salsa de [place]. ? Alimentos fritos y [place], [place] donas, papas fritas y aderezos ricos en grasas. ? Carnes con alto contenido de grasa, como salchichas, y cortes de carnes rojas y blancas con mucha grasa, por ejemplo, chuletas o costillas, embutidos, jamn y tocino. ? Productos lcteos ricos en grasas, como leche [place], [place] y [place] crema.  Haga comidas pequeas y frecuentes Freight forwarder de comidas abundantes.  Evite beber grandes cantidades de lquidos con las comidas.  Evite comer 2 o 3horas antes de acostarse.  Evite recostarse inmediatamente despus de comer.  No haga ejercicios enseguida despus de comer. Estilo de vida      Si tiene sobrepeso, baje Civil Service fast streamer a un peso saludable para usted. Pdale consejos al mdico para bajar de peso de [place] segura.  No consuma ningn producto que contenga nicotina o tabaco, como cigarrillos, cigarrillos electrnicos y tabaco de Theatre manager. Estos pueden empeorar los sntomas. Si necesita ayuda para dejar de fumar, consulte al mdico.  Use ropa suelta. No use nada apretado alrededor de la cintura que haga  presin sobre el abdomen.  Levante (eleve) la cabecera de la cama aproximadamente 6pulgadas (15cm) para dormir.  Trate de reducir [employer] de estrs con actividades tales como el yoga o la meditacin. Si necesita ayuda para reducir [employer]ney de estrs, consulte al mdico. Indicaciones generales  Est atento a cualquier cambio en los sntomas.  Tome los medicamentos de venta libre y los recetados solamente como se lo haya indicado el mdico. ? No tome aspirina, ibuprofeno ni otros antiinflamatorios no esteroideos (AINE) a menos que el mdico se lo indique. ? Deje de tomar los Coca[employer]a como se lo haya indicado el mdico. Si deja de tomar algunos medicamentos demasiado rpido, los sntomas Buyer, retail.  Concurra a todas las visitas de [address] se lo haya indicado el mdico. Esto es importante. Comunquese con un mdico si:  Aparecen nuevos sntomas.  Baja de peso sin causa aparente.  Tiene dificultad para tragar, o le duele cuando traga.  Tiene tos persistente o sibilancias.  Los sntomas no mejoran con Scientist, research (medical).  Tiene acidez estomacal con frecuencia durante ms  de 2semanas. Solicite ayuda inmediatamente si:  Goldman Sachs, el cuello, la [place], los dientes o la espalda.  Se siente transpirado, mareado o tiene una sensacin de desvanecimiento.  Siente falta de aire o Journalist, newspaper.  Vomita y el vmito tiene un aspecto similar a la sangre o a los posos de caf.  Las heces son sanguinolentas o negras. Estos sntomas pueden representar un problema grave que constituye Radio broadcast assistant. No espere a ver si los sntomas desaparecen. Solicite atencin mdica de inmediato. Comunquese con el servicio de emergencias de su localidad (911 en los [place]). No conduzca por sus propios medios [employer] hospital. Resumen  La acidez estomacal es un tipo de dolor o Dentist que puede sentirse en la garganta o en el pecho. Con frecuencia  se describe como un dolor urente (ardor). Tambin puede causar mal sabor en la boca que se siente cido.  Evite ciertos alimentos y bebidas como se lo haya indicado el mdico.  Tome los medicamentos de venta libre y los recetados solamente como se lo haya indicado el mdico. No tome aspirina, ibuprofeno ni otros antiinflamatorios no esteroideos (AINE) a menos que el mdico se lo indique.  Comunquese con un mdico si los sntomas no mejoran o si empeoran. Esta informacin no tiene Theme park manager el consejo del mdico. Asegrese de hacerle al mdico cualquier pregunta que tenga. Document Revised: 12/24/2017 Document Reviewed: 12/24/2017 Elsevier Patient Education  2020 [employer].

## 2019-12-07 ENCOUNTER — Other Ambulatory Visit (INDEPENDENT_AMBULATORY_CARE_PROVIDER_SITE_OTHER): Payer: Medicare Other

## 2019-12-07 DIAGNOSIS — E782 Mixed hyperlipidemia: Secondary | ICD-10-CM | POA: Diagnosis not present

## 2019-12-07 DIAGNOSIS — I10 Essential (primary) hypertension: Secondary | ICD-10-CM

## 2019-12-07 LAB — LDL CHOLESTEROL, DIRECT: Direct LDL: 122 mg/dL

## 2019-12-07 LAB — URINALYSIS, ROUTINE W REFLEX MICROSCOPIC
Bilirubin Urine: NEGATIVE
Hgb urine dipstick: NEGATIVE
Ketones, ur: NEGATIVE
Leukocytes,Ua: NEGATIVE
Nitrite: NEGATIVE
RBC / HPF: NONE SEEN (ref 0–?)
Specific Gravity, Urine: 1.015 (ref 1.000–1.030)
Total Protein, Urine: NEGATIVE
Urine Glucose: NEGATIVE
Urobilinogen, UA: 0.2 (ref 0.0–1.0)
pH: 6 (ref 5.0–8.0)

## 2019-12-07 LAB — COMPREHENSIVE METABOLIC PANEL
ALT: 19 U/L (ref 0–53)
AST: 23 U/L (ref 0–37)
Albumin: 4.3 g/dL (ref 3.5–5.2)
Alkaline Phosphatase: 81 U/L (ref 39–117)
BUN: 21 mg/dL (ref 6–23)
CO2: 30 mEq/L (ref 19–32)
Calcium: 9 mg/dL (ref 8.4–10.5)
Chloride: 102 mEq/L (ref 96–112)
Creatinine, Ser: 1.2 mg/dL (ref 0.40–1.50)
GFR: 58.78 mL/min — ABNORMAL LOW (ref >60.00)
Glucose, Bld: 93 mg/dL (ref 70–99)
Potassium: 4.3 mEq/L (ref 3.5–5.1)
Sodium: 137 mEq/L (ref 135–145)
Total Bilirubin: 0.6 mg/dL (ref 0.2–1.2)
Total Protein: 7.1 g/dL (ref 6.0–8.3)

## 2019-12-07 LAB — CBC
HCT: 39.5 % (ref 39.0–52.0)
Hemoglobin: 13.2 g/dL (ref 13.0–17.0)
MCHC: 33.3 g/dL (ref 30.0–36.0)
MCV: 98 fl (ref 78.0–100.0)
Platelets: 217 10*3/uL (ref 150.0–400.0)
RBC: 4.03 Mil/uL — ABNORMAL LOW (ref 4.22–5.81)
RDW: 12.4 % (ref 11.5–15.5)
WBC: 7.7 10*3/uL (ref 4.0–10.5)

## 2019-12-07 LAB — LIPID PANEL
Cholesterol: 196 mg/dL (ref 0–200)
HDL: 35.5 mg/dL — ABNORMAL LOW (ref >39.00)
NonHDL: 160.2
Total CHOL/HDL Ratio: 6
Triglycerides: 208 mg/dL — ABNORMAL HIGH (ref 0.0–149.0)
VLDL: 41.6 mg/dL — ABNORMAL HIGH (ref 0.0–40.0)

## 2019-12-08 ENCOUNTER — Telehealth: Payer: Self-pay | Admitting: Family Medicine

## 2019-12-08 NOTE — Telephone Encounter (Signed)
Patient daughter is calling and wanted to see if patients lab results were in. CB is [phone].

## 2020-01-03 ENCOUNTER — Other Ambulatory Visit: Payer: Self-pay | Admitting: Family Medicine

## 2020-01-03 DIAGNOSIS — I10 Essential (primary) hypertension: Secondary | ICD-10-CM

## 2020-01-13 DIAGNOSIS — H04123 Dry eye syndrome of bilateral lacrimal glands: Secondary | ICD-10-CM | POA: Diagnosis not present

## 2020-01-13 DIAGNOSIS — Z961 Presence of intraocular lens: Secondary | ICD-10-CM | POA: Diagnosis not present

## 2020-01-25 ENCOUNTER — Telehealth: Payer: Self-pay | Admitting: Family Medicine

## 2020-01-25 NOTE — Telephone Encounter (Signed)
Patients daughter Micah Noel is calling to get a referral for patient to go to [facility] on [facility] for his headaches. Please give her a call back at [phone] if you have any questions.

## 2020-01-25 NOTE — Telephone Encounter (Signed)
Patients daughter calling with concerns about his continued headaches. They would like a referral to see neurologist. I explained to patients daughter that Dr. Doreene Burke is not in the office until next week she insist on asking someone else to view last notes where they mentioned headaches and see if referral could be made. Please advise.

## 2020-01-26 NOTE — Telephone Encounter (Signed)
I do not see any OV which addressed headache in the past. Please advise to wait for Dr. Evangeline Gula return or schedule an OV with me or Dr. Salena Saner to discuss headache and need for referral.

## 2020-01-26 NOTE — Telephone Encounter (Signed)
Called LM for patients daughter informing her that patient will need to come in for evaluation of headaches or wait until Dr. Doreene Burke is back to request referral. Asked to call return the call

## 2020-01-28 ENCOUNTER — Emergency Department (HOSPITAL_BASED_OUTPATIENT_CLINIC_OR_DEPARTMENT_OTHER): Payer: Medicare Other

## 2020-01-28 ENCOUNTER — Encounter (HOSPITAL_BASED_OUTPATIENT_CLINIC_OR_DEPARTMENT_OTHER): Payer: Self-pay

## 2020-01-28 ENCOUNTER — Emergency Department (HOSPITAL_BASED_OUTPATIENT_CLINIC_OR_DEPARTMENT_OTHER)
Admission: EM | Admit: 2020-01-28 | Discharge: 2020-01-28 | Disposition: A | Discharge status: Home / Self Care | Payer: Medicare Other | Attending: Emergency Medicine | Admitting: Emergency Medicine

## 2020-01-28 ENCOUNTER — Other Ambulatory Visit: Payer: Self-pay

## 2020-01-28 DIAGNOSIS — R42 Dizziness and giddiness: Secondary | ICD-10-CM | POA: Diagnosis present

## 2020-01-28 DIAGNOSIS — R519 Headache, unspecified: Secondary | ICD-10-CM | POA: Diagnosis not present

## 2020-01-28 DIAGNOSIS — I10 Essential (primary) hypertension: Secondary | ICD-10-CM | POA: Insufficient documentation

## 2020-01-28 DIAGNOSIS — R001 Bradycardia, unspecified: Secondary | ICD-10-CM | POA: Diagnosis not present

## 2020-01-28 DIAGNOSIS — Z79899 Other long term (current) drug therapy: Secondary | ICD-10-CM | POA: Insufficient documentation

## 2020-01-28 DIAGNOSIS — Z7982 Long term (current) use of aspirin: Secondary | ICD-10-CM | POA: Insufficient documentation

## 2020-01-28 DIAGNOSIS — I6523 Occlusion and stenosis of bilateral carotid arteries: Secondary | ICD-10-CM | POA: Diagnosis not present

## 2020-01-28 LAB — BASIC METABOLIC PANEL
Anion gap: 9 (ref 5–15)
BUN: 19 mg/dL (ref 8–23)
CO2: 23 mmol/L (ref 22–32)
Calcium: 8.7 mg/dL — ABNORMAL LOW (ref 8.9–10.3)
Chloride: 105 mmol/L (ref 98–111)
Creatinine, Ser: 1.17 mg/dL (ref 0.61–1.24)
GFR calc Af Amer: >60 mL/min (ref >60)
GFR calc non Af Amer: >60 mL/min (ref >60)
Glucose, Bld: 113 mg/dL — ABNORMAL HIGH (ref 70–99)
Potassium: 3.9 mmol/L (ref 3.5–5.1)
Sodium: 137 mmol/L (ref 135–145)

## 2020-01-28 LAB — URINALYSIS, ROUTINE W REFLEX MICROSCOPIC
Bilirubin Urine: NEGATIVE
Glucose, UA: NEGATIVE mg/dL
Hgb urine dipstick: NEGATIVE
Ketones, ur: NEGATIVE mg/dL
Leukocytes,Ua: NEGATIVE
Nitrite: NEGATIVE
Protein, ur: NEGATIVE mg/dL
Specific Gravity, Urine: 1.025 (ref 1.005–1.030)
pH: 6 (ref 5.0–8.0)

## 2020-01-28 LAB — CBC
HCT: 40.9 % (ref 39.0–52.0)
Hemoglobin: 13.4 g/dL (ref 13.0–17.0)
MCH: 31.6 pg (ref 26.0–34.0)
MCHC: 32.8 g/dL (ref 30.0–36.0)
MCV: 96.5 fL (ref 80.0–100.0)
Platelets: 225 10*3/uL (ref 150–400)
RBC: 4.24 MIL/uL (ref 4.22–5.81)
RDW: 11.9 % (ref 11.5–15.5)
WBC: 5.8 10*3/uL (ref 4.0–10.5)
nRBC: 0 % (ref 0.0–0.2)

## 2020-01-28 LAB — CBG MONITORING, ED: Glucose-Capillary: 112 mg/dL — ABNORMAL HIGH (ref 70–99)

## 2020-01-28 LAB — SEDIMENTATION RATE: Sed Rate: 7 mm/hr (ref 0–16)

## 2020-01-28 MED ORDER — IOHEXOL 350 MG/ML SOLN
100.0000 mL | Freq: Once | INTRAVENOUS | Status: AC | PRN
  Administered 2020-01-28: 100 mL via INTRAVENOUS

## 2020-01-28 MED ORDER — SODIUM CHLORIDE 0.9 % IV BOLUS
1000.0000 mL | Freq: Once | INTRAVENOUS | Status: AC
  Administered 2020-01-28: 1000 mL via INTRAVENOUS

## 2020-01-28 MED ORDER — SODIUM CHLORIDE 0.9% FLUSH
3.0000 mL | Freq: Once | INTRAVENOUS | Status: DC
  Filled 2020-01-28: qty 3

## 2020-01-28 NOTE — ED Notes (Signed)
In for rounding, pt denies any pain, has had both CT scans done, awaiting results.

## 2020-01-28 NOTE — Telephone Encounter (Signed)
Spoke with patients daughter she stated her father was currently having a headache, dizziness and a possible black out, per front staff there are no appointments across the division, I advised her to take her father, to the [facility] [place] to be seen immediately, she stated she was familiar with the location and will head there now.

## 2020-01-28 NOTE — Discharge Instructions (Addendum)
If you develop continued, recurrent, or worsening headache, fever, neck stiffness, vomiting, blurry or double vision, weakness or numbness in your arms or legs, trouble speaking, or any other new/concerning symptoms then return to the ER for evaluation.   Si presenta dolor de [place] continuo, recurrente o que [place], fiebre, rigidez de cuello, vmitos, visin borrosa o doble, debilidad o entumecimiento en los brazos o piernas, dificultad para hablar o cualquier otro sntoma nuevo o preocupante, regrese a la sala de emergencias para que lo evalen.

## 2020-01-28 NOTE — ED Notes (Signed)
ED Provider at bedside. 

## 2020-01-28 NOTE — ED Notes (Signed)
Ambulated to bathroom with steady gait

## 2020-01-28 NOTE — ED Provider Notes (Signed)
[facility] Provider Note   CSN: [id number] Arrival date & time: 01/28/20  1212     History Chief Complaint  Patient presents with  . Dizziness    Fernando Saunders is a 79 y.o. male.  HPI 79 year old male presents with headache, dizziness, and vision changes.  Ongoing for over 1 month.  Seems to come and go.  The headache is persistently a right-sided frontal and temporal headache.  He does not currently have it though last week it seemed to be constant all week.  It feels like a pounding sensation.  Sometimes when changing position, such as bending over or sitting up after laying flat, he will get dizziness and his vision will go black bilaterally.  This is a transient finding.  Currently no vision changes.  No neck stiffness, fever, vomiting.  No weakness or numbness in the extremities. Has been having sinus drainage for the last several weeks as well, feels like it's only right sided drainage.   He received a CT scan while in the waiting room and after this he sat up to get off the stretcher and acutely felt dizzy.  However now he feels fine   Past Medical History:  Diagnosis Date  . Arthritis   . Diverticulosis   . GERD (gastroesophageal reflux disease)   . Hyperlipidemia     Patient Active Problem List   Diagnosis Date Noted  . Nail dystrophy 12/06/2019  . Abdominal bloating 05/26/2018  . Cutaneous skin tags 05/11/2018  . Medically noncompliant 12/08/2017  . Chronic right shoulder pain 08/04/2017  . Chronic left shoulder pain 08/04/2017  . Cervical strain 08/01/2017  . Chronic pain of both shoulders 08/01/2017  . History of urinary urgency 08/01/2017  . Mixed hyperlipidemia 01/11/2017  . Routine general medical examination at a health care facility 01/11/2017  . Dyslipidemia 01/11/2017  . Seasonal allergies 10/10/2014  . Essential hypertension 10/10/2014  . Language barrier 10/10/2014  . Chest pain 08/08/2014  . Back pain 03/30/2012    . Gastroesophageal reflux disease 10/29/2011    Past Surgical History:  Procedure Laterality Date  . COLONOSCOPY  11/04/2011   Dr. Silvano Rusk  . EYE SURGERY         Family History  Problem Relation Age of Onset  . Diabetes Mother   . Cancer Sister   . Colon cancer Neg Hx   . Stomach cancer Neg Hx   . Heart attack Neg Hx     Social History   Tobacco Use  . Smoking status: Never Smoker  . Smokeless tobacco: Never Used  Vaping Use  . Vaping Use: Never used  Substance Use Topics  . Alcohol use: Yes    Alcohol/week: 0.0 standard drinks    Comment: occasional  . Drug use: No    Home Medications Prior to Admission medications   Medication Sig Start Date End Date Taking? Authorizing Provider  aspirin 81 MG tablet Take 81 mg by mouth daily.    [provider]  lisinopril (ZESTRIL) 10 MG tablet Take 1 tablet by mouth once daily 01/03/20   Libby Maw, MD  Multiple Vitamin (MULTIVITAMIN) tablet Take 1 tablet by mouth daily.    [provider]  MYRBETRIQ 50 MG TB24 tablet Take 1 tablet by mouth daily. 05/20/18   [provider]  omeprazole (PRILOSEC) 40 MG capsule Take 1 capsule (40 mg total) by mouth daily. 12/06/19   Libby Maw, MD  pravastatin (PRAVACHOL) 40 MG tablet Take 1  tablet (40 mg total) by mouth every evening. 12/06/19   Libby Maw, MD  tamsulosin (FLOMAX) 0.4 MG CAPS capsule Take 0.4 mg by mouth daily. 12/02/19   [provider]    Allergies    Patient has no known allergies.  Review of Systems   Review of Systems  Constitutional: Negative for fever.  HENT: Negative for sinus pressure.   Eyes: Positive for visual disturbance.  Cardiovascular: Negative for chest pain.  Gastrointestinal: Negative for vomiting.  Musculoskeletal: Negative for neck stiffness.  Neurological: Positive for dizziness and headaches. Negative for weakness and numbness.  All other systems reviewed and are  negative.   Physical Exam Updated Vital Signs BP (!) 135/64   Pulse 45   Temp 98.1 F (36.7 C) (Oral)   Resp 18   Ht 6' (1.829 m)   Wt 82.6 kg   SpO2 98%   BMI 24.68 kg/m   Physical Exam Vitals and nursing note reviewed.  Constitutional:      Appearance: He is well-developed.  HENT:     Head: Normocephalic and atraumatic.     Right Ear: External ear normal.     Left Ear: External ear normal.     Nose: Nose normal.     Right Sinus: No maxillary sinus tenderness or frontal sinus tenderness.     Left Sinus: No maxillary sinus tenderness or frontal sinus tenderness.  Eyes:     General:        Right eye: No discharge.        Left eye: No discharge.     Extraocular Movements: Extraocular movements intact.     Pupils: Pupils are equal, round, and reactive to light.  Cardiovascular:     Rate and Rhythm: Normal rate and regular rhythm.     Heart sounds: Normal heart sounds.  Pulmonary:     Effort: Pulmonary effort is normal.     Breath sounds: Normal breath sounds.  Abdominal:     Palpations: Abdomen is soft.     Tenderness: There is no abdominal tenderness.  Musculoskeletal:     Cervical back: Neck supple.  Skin:    General: Skin is warm and dry.  Neurological:     Mental Status: He is alert.     Comments: CN 3-12 grossly intact. 5/5 strength in all 4 extremities. Grossly normal sensation. Normal finger to nose. Normal gait  Psychiatric:        Mood and Affect: Mood is not anxious.     ED Results / Procedures / Treatments   Labs (all labs ordered are listed, but only abnormal results are displayed) Labs Reviewed  BASIC METABOLIC PANEL - Abnormal; Notable for the following components:      Result Value   Glucose, Bld 113 (*)    Calcium 8.7 (*)    All other components within normal limits  CBG MONITORING, ED - Abnormal; Notable for the following components:   Glucose-Capillary 112 (*)    All other components within normal limits  CBC  URINALYSIS, ROUTINE W  REFLEX MICROSCOPIC  SEDIMENTATION RATE    EKG EKG Interpretation  Date/Time:  Friday January 28 2020 12:36:32 EDT Ventricular Rate:  59 PR Interval:  156 QRS Duration: 84 QT Interval:  402 QTC Calculation: 397 R Axis:   37 Text Interpretation: Sinus bradycardia Otherwise normal ECG No prior ECG for comparison. No STEMI Confirmed by Antony Blackbird [phone]) on 01/28/2020 2:03:14 PM     Radiology CT Angio Head W or Wo  Contrast  Result Date: 01/28/2020 CLINICAL DATA:  Dizziness and vision changes EXAM: CT ANGIOGRAPHY HEAD AND NECK TECHNIQUE: Multidetector CT imaging of the head and neck was performed using the standard protocol during bolus administration of intravenous contrast. Multiplanar CT image reconstructions and MIPs were obtained to evaluate the vascular anatomy. Carotid stenosis measurements (when applicable) are obtained utilizing NASCET criteria, using the distal internal carotid diameter as the denominator. CONTRAST:  19m OMNIPAQUE IOHEXOL 350 MG/ML SOLN COMPARISON:  None. FINDINGS: CT HEAD Brain: There is no acute intracranial hemorrhage, mass effect, or edema. Gray-white differentiation is preserved. There is no extra-axial fluid collection. Prominence of the ventricles and sulci reflects minor generalized parenchymal volume loss. Patchy hypoattenuation in the supratentorial white matter is nonspecific but may reflect mild to moderate chronic microvascular ischemic changes. Vascular: No hyperdense vessel or unexpected calcification. Skull: Calvarium is unremarkable. Sinuses/Orbits: Chronic right maxillary sinusitis. Bilateral lens replacements. Other: None. Review of the MIP images confirms the above findings CTA NECK Aortic arch: Great vessel origins are patent. Right carotid system: Patent. Trace calcified plaque at the ICA origin without measurable stenosis. Left carotid system: Patent. Mild noncalcified and calcified plaque at the ICA origin causing minimal stenosis. Vertebral  arteries: Right vertebral artery is patent and dominant. Left vertebral artery is patent. Skeleton: Degenerative changes of the cervical spine are greatest at C5-C6. Other neck: No mass or adenopathy. Upper chest: No apical lung mass. Review of the MIP images confirms the above findings CTA HEAD Anterior circulation: Intracranial internal carotid arteries are patent. Anterior cerebral arteries are patent. Left A1 ACA is dominant. Middle cerebral arteries are patent. Posterior circulation: Intracranial vertebral arteries and PICA origins are patent. Basilar artery is patent. Superior cerebellar artery origins are patent. Posterior cerebral arteries are patent. There is near fetal origin of the right PCA. Venous sinuses: Patent as allowed by contrast bolus timing. Review of the MIP images confirms the above findings IMPRESSION: No acute intracranial abnormality. Mild to moderate chronic microvascular ischemic changes. There is no large vessel occlusion, hemodynamically significant stenosis, or evidence of dissection. Electronically Signed   By: PMacy MisM.D.   On: 01/28/2020 14:26   CT Angio Neck W and/or Wo Contrast  Result Date: 01/28/2020 CLINICAL DATA:  Dizziness and vision changes EXAM: CT ANGIOGRAPHY HEAD AND NECK TECHNIQUE: Multidetector CT imaging of the head and neck was performed using the standard protocol during bolus administration of intravenous contrast. Multiplanar CT image reconstructions and MIPs were obtained to evaluate the vascular anatomy. Carotid stenosis measurements (when applicable) are obtained utilizing NASCET criteria, using the distal internal carotid diameter as the denominator. CONTRAST:  1085mOMNIPAQUE IOHEXOL 350 MG/ML SOLN COMPARISON:  None. FINDINGS: CT HEAD Brain: There is no acute intracranial hemorrhage, mass effect, or edema. Gray-white differentiation is preserved. There is no extra-axial fluid collection. Prominence of the ventricles and sulci reflects minor  generalized parenchymal volume loss. Patchy hypoattenuation in the supratentorial white matter is nonspecific but may reflect mild to moderate chronic microvascular ischemic changes. Vascular: No hyperdense vessel or unexpected calcification. Skull: Calvarium is unremarkable. Sinuses/Orbits: Chronic right maxillary sinusitis. Bilateral lens replacements. Other: None. Review of the MIP images confirms the above findings CTA NECK Aortic arch: Great vessel origins are patent. Right carotid system: Patent. Trace calcified plaque at the ICA origin without measurable stenosis. Left carotid system: Patent. Mild noncalcified and calcified plaque at the ICA origin causing minimal stenosis. Vertebral arteries: Right vertebral artery is patent and dominant. Left vertebral artery is patent. Skeleton: Degenerative  changes of the cervical spine are greatest at C5-C6. Other neck: No mass or adenopathy. Upper chest: No apical lung mass. Review of the MIP images confirms the above findings CTA HEAD Anterior circulation: Intracranial internal carotid arteries are patent. Anterior cerebral arteries are patent. Left A1 ACA is dominant. Middle cerebral arteries are patent. Posterior circulation: Intracranial vertebral arteries and PICA origins are patent. Basilar artery is patent. Superior cerebellar artery origins are patent. Posterior cerebral arteries are patent. There is near fetal origin of the right PCA. Venous sinuses: Patent as allowed by contrast bolus timing. Review of the MIP images confirms the above findings IMPRESSION: No acute intracranial abnormality. Mild to moderate chronic microvascular ischemic changes. There is no large vessel occlusion, hemodynamically significant stenosis, or evidence of dissection. Electronically Signed   By: Macy Mis M.D.   On: 01/28/2020 14:26    Procedures Procedures (including critical care time)  Medications Ordered in ED Medications  sodium chloride flush (NS) 0.9 % injection  3 mL (3 mLs Intravenous Not Given 01/28/20 1426)  iohexol (OMNIPAQUE) 350 MG/ML injection 100 mL (100 mLs Intravenous Contrast Given 01/28/20 1348)  sodium chloride 0.9 % bolus 1,000 mL (0 mLs Intravenous Stopped 01/28/20 1630)    ED Course  I have reviewed the triage vital signs and the nursing notes.  Pertinent labs & imaging results that were available during my care of the patient were reviewed by me and considered in my medical decision making (see chart for details).    MDM Rules/Calculators/A&P                          Patient continues to have no headache.  He is not dizzy/lightheaded.  CT angiography ordered prior to my arrival is benign.  I viewed this images.  No obvious dissection, aneurysm, etc.  There is no obvious venous sinus issues.  He does have some chronic right maxillary sinusitis but no acute inflammation or tenderness now to suggest this is a sinusitis.  He is able to ambulate here without difficulty.  He did develop a little more bradycardia with heart rate in the 40s but is continued to be asymptomatic.  There is no obvious A-V block.  At this point, no clear finding and I think it is reasonable to have him follow-up with neurology as well as cardiology for the asymptomatic bradycardia.  Doubt acute stroke.  He gets vision changes when he stands up too quickly but this is probably more of a near syncope than pathologic CNS disease.  However this is not happening currently.  His labs are unremarkable.  ESR normal in the setting of right temporal pain.  It is also bilateral vision changes when he stands up or changes position quickly.  Discharged home with return precautions. Final Clinical Impression(s) / ED Diagnoses Final diagnoses:  Right-sided headache  Sinus bradycardia    Rx / DC Orders ED Discharge Orders         Ordered    Ambulatory referral to Neurology     Discontinue  Reprint    Comments: An appointment is requested in approximately: 2 weeks   01/28/20 1751            Sherwood Gambler, MD 01/28/20 1800

## 2020-01-28 NOTE — ED Notes (Signed)
Pt with vasovagal response after CT scan, ambulatory to CT, when leaving CT pt became pale with unsteady gait and syncopal upon coming back into CT scan room, pt laid on scanner table, pt responsive without breathing difficulty or hives, pt evaluated by RN and sent to Room 2

## 2020-01-28 NOTE — ED Triage Notes (Signed)
Per daughter/interpreter-pt with dizziness, "vision turns black", HA "above right eye" x 1 month-seen by PCP x 1 dx with vertigo-NAD-steady gait

## 2020-01-31 ENCOUNTER — Telehealth: Payer: Self-pay | Admitting: Internal Medicine

## 2020-01-31 NOTE — Telephone Encounter (Signed)
     I went in pt's chart to see what his ED discharge instuctions were from 01-28-20, Pt was made an appt for 02-01-20 with Dr Jacques Navy

## 2020-02-01 ENCOUNTER — Other Ambulatory Visit: Payer: Self-pay

## 2020-02-01 ENCOUNTER — Ambulatory Visit (INDEPENDENT_AMBULATORY_CARE_PROVIDER_SITE_OTHER): Payer: Medicare Other | Admitting: Internal Medicine

## 2020-02-01 ENCOUNTER — Encounter: Payer: Self-pay | Admitting: Internal Medicine

## 2020-02-01 VITALS — BP 116/72 | HR 60 | Ht 72.0 in | Wt 183.6 lb

## 2020-02-01 DIAGNOSIS — I495 Sick sinus syndrome: Secondary | ICD-10-CM

## 2020-02-01 DIAGNOSIS — E782 Mixed hyperlipidemia: Secondary | ICD-10-CM

## 2020-02-01 DIAGNOSIS — I10 Essential (primary) hypertension: Secondary | ICD-10-CM | POA: Diagnosis not present

## 2020-02-01 DIAGNOSIS — R001 Bradycardia, unspecified: Secondary | ICD-10-CM | POA: Diagnosis not present

## 2020-02-01 DIAGNOSIS — R55 Syncope and collapse: Secondary | ICD-10-CM | POA: Diagnosis not present

## 2020-02-01 NOTE — Patient Instructions (Addendum)
Medication Instructions:  No Changes *If you need a refill on your cardiac medications before your next appointment, please call your pharmacy*   Lab Work: None Ordered If you have labs (blood work) drawn today and your tests are completely normal, you will receive your results only by: Marland Kitchen MyChart Message (if you have MyChart) OR . A paper copy in the mail If you have any lab test that is abnormal or we need to change your treatment, we will call you to review the results.   Testing/Procedures: This will take place at [address] Your physician has requested that you have an echocardiogram. Echocardiography is a painless test that uses sound waves to create images of your heart. It provides your doctor with information about the size and shape of your heart and how well your heart's chambers and valves are working. This procedure takes approximately one hour. There are no restrictions for this procedure.  Your physician has recommended that you wear an event monitor 30 days. Event monitors are medical devices that record the heart's electrical activity. Doctors most often [place] these monitors to diagnose arrhythmias. Arrhythmias are problems with the speed or rhythm of the heartbeat. The monitor is a small, portable device. You can wear one while you do your normal daily activities. This is usually used to diagnose what is causing palpitations/syncope (passing out).     Follow-Up: At [facility], you and your health needs are our priority.  As part of our continuing mission to provide you with exceptional heart care, we have created designated Provider Care Teams.  These Care Teams include your primary Cardiologist (physician) and Advanced Practice Providers (APPs -  Physician Assistants and Nurse Practitioners) who all work together to provide you with the care you need, when you need it.   Your next appointment:   3 month(s) following 30 day event monitor, and echo    The format for your next appointment:   In Person  Provider:   Weston Brass, MD   Other Instructions  You have been referred to Cardiac Electrophysiology. At [address]. Office will call to schedule appointment.      .[employer] Cardiac Event Monitor Instructions Your physician has requested you wear your cardiac event monitor for __30___ days, . [employer] may call or text to confirm a shipping address. The monitor will be sent to a land address via UPS. [employer] will not ship a monitor to a PO BOX. It typically takes 3-5 days to receive your monitor after it has been enrolled. [employer] will assist with USPS tracking if your package is delayed. The telephone number for [employer] is [phone]. Once you have received your monitor, please review the enclosed instructions. Instruction tutorials can also be viewed under help and settings on the enclosed cell phone. Your monitor has already been registered assigning a specific monitor serial # to you.  Applying the monitor Remove cell phone from case and turn it on. The cell phone works as IT consultant and needs to be within [employer] of you at all times. The cell phone will need to be charged on a daily basis. We recommend you plug the cell phone into the enclosed charger at your bedside table every night.  Monitor batteries: You will receive two monitor batteries labelled #1 and #2. These are your recorders. Plug battery #2 onto the second connection on the enclosed charger. Keep one battery on the charger at all times. This will keep  the monitor battery deactivated. It will also keep it fully charged for when you need to switch your monitor batteries. A small light will be blinking on the battery emblem when it is charging. The light on the battery emblem will remain on when the battery is fully charged.  Open package of a Monitor strip. Insert battery #1 into black hood on strip and gently squeeze  monitor battery onto connection as indicated in instruction booklet. Set aside while preparing skin.  Choose location for your strip, vertical or horizontal, as indicated in the instruction booklet. Shave to remove all hair from location. There cannot be any lotions, oils, powders, or colognes on skin where monitor is to be applied. Wipe skin clean with enclosed Saline wipe. Dry skin completely.  Peel paper labeled #1 off the back of the Monitor strip exposing the adhesive. Place the monitor on the chest in the vertical or horizontal position shown in the instruction booklet. One arrow on the monitor strip must be pointing upward. Carefully remove paper labeled #2, attaching remainder of strip to your skin. Try not to create any folds or wrinkles in the strip as you apply it.  Firmly press and release the circle in the center of the monitor battery. You will hear a small beep. This is turning the monitor battery on. The heart emblem on the monitor battery will light up every 5 seconds if the monitor battery in turned on and connected to the patient securely. Do not push and hold the circle down as this turns the monitor battery off. The cell phone will locate the monitor battery. A screen will appear on the cell phone checking the connection of your monitor strip. This may read poor connection initially but change to good connection within the next minute. Once your monitor accepts the connection you will hear a series of 3 beeps followed by a climbing crescendo of beeps. A screen will appear on the cell phone showing the two monitor strip placement options. Touch the picture that demonstrates where you applied the monitor strip.  Your monitor strip and battery are waterproof. You are able to shower, bathe, or swim with the monitor on. They just ask you do not submerge deeper than 3 feet underwater. We recommend removing the monitor if you are swimming in a lake, river, or ocean.  Your  monitor battery will need to be switched to a fully charged monitor battery approximately once a week. The cell phone will alert you of an action which needs to be made.  On the cell phone, tap for details to reveal connection status, monitor battery status, and cell phone battery status. The green dots indicates your monitor is in good status. A red dot indicates there is something that needs your attention.  To record a symptom, click the circle on the monitor battery. In 30-60 seconds a list of symptoms will appear on the cell phone. Select your symptom and tap save. Your monitor will record a sustained or significant arrhythmia regardless of you clicking the button. Some patients do not feel the heart rhythm irregularities. [employer] will notify us of any serious or critical events.  Refer to instruction booklet for instructions on switching batteries, changing strips, the Do not disturb or Pause features, or any additional questions.  Call [employer] at [phone], to confirm your monitor is transmitting and record your baseline. They will answer any questions you may have regarding the monitor instructions at that time.  Returning the monitor to [employer] Place  all equipment back into blue box. Peel off strip of paper to expose adhesive and close box securely. There is a prepaid UPS shipping label on this box. Drop in a UPS drop box, or at a UPS facility like [employer]. You may also contact [employer] to arrange UPS to pick up monitor package at your home.

## 2020-02-01 NOTE — Progress Notes (Signed)
Cardiology Office Note:    Date:  02/01/2020   ID:  Fernando Saunders, DOB [birth year], MRN [id number]  PCP:  Mliss Sax, MD  Cardiologist:  No primary care provider on file.  Electrophysiologist:  None   Referring MD: Mliss Sax,*   Chief Complaint: bradycardia and syncope  History of Present Illness:    Fernando Saunders is a 79 y.o. male with a history of HLD and GERD who presents for syncope and bradycardia. He is a fit appearing 79 year old male who presents today with his daughter.  His daughter serves as the interpreter for this visit.  I have offered formal interpreter services which are deferred.  He was recently in the emergency department on 01/28/2020 with headache, dizziness, vision change.  This has been going on for approximately a month.  He feels a pounding sensation in his head and notices significant symptoms while changing position such as bending over or sitting up after laying flat.  He will have dizziness and his vision will go black bilaterally without focal signs.  Denies weakness or numbness.  Not on AVN blocking agents.  He underwent CT angiography of the head and neck in the ER which was felt to be benign and demonstrated right maxillary sinusitis.  Was noted to be bradycardic in the ED with a heart rate in the 40s.  No AV block noted on telemetry.  ECG obtained in the ED shows sinus bradycardia with a rate of 43 bpm.  There is slight variability on ECG of RR interval which might represent sinus arrhythmia.  He denies chest pain or shortness of breath.  Denies significant palpitations.  Does endorse dizziness and feels he does have syncopal episodes though has some difficulty describing these. Past Medical History:  Diagnosis Date  . Arthritis   . Diverticulosis   . GERD (gastroesophageal reflux disease)   . Hyperlipidemia     Past Surgical History:  Procedure Laterality Date  . COLONOSCOPY  11/04/2011   Dr. Stan Head  . EYE SURGERY       Current Medications: Current Meds  Medication Sig  . lisinopril (ZESTRIL) 10 MG tablet Take 1 tablet by mouth once daily  . Multiple Vitamin (MULTIVITAMIN) tablet Take 1 tablet by mouth daily.  Marland Kitchen MYRBETRIQ 50 MG TB24 tablet Take 1 tablet by mouth daily.  Marland Kitchen omega-3 acid ethyl esters (LOVAZA) 1 g capsule Take by mouth as needed.  Marland Kitchen omeprazole (PRILOSEC) 40 MG capsule Take 1 capsule (40 mg total) by mouth daily.  . pravastatin (PRAVACHOL) 40 MG tablet Take 1 tablet (40 mg total) by mouth every evening.  . tamsulosin (FLOMAX) 0.4 MG CAPS capsule Take 0.4 mg by mouth daily.  . [DISCONTINUED] aspirin 81 MG tablet Take 81 mg by mouth daily.     Allergies:   Patient has no known allergies.   Social History   Socioeconomic History  . Marital status: Married    Spouse name: Not on file  . Number of children: 2  . Years of education: Not on file  . Highest education level: Not on file  Occupational History  . Occupation: Unemployed  Tobacco Use  . Smoking status: Never Smoker  . Smokeless tobacco: Never Used  Vaping Use  . Vaping Use: Never used  Substance and Sexual Activity  . Alcohol use: Yes    Alcohol/week: 0.0 standard drinks    Comment: occasional  . Drug use: No  . Sexual activity: Not on file  Other Topics Concern  .  Not on file  Social History Narrative  . Not on file   Social Determinants of Health   Financial Resource Strain:   . Difficulty of Paying Living Expenses:   Food Insecurity:   . Worried About Programme researcher, broadcasting/film/videounning Out of Food in the Last Year:   . Baristaan Out of Food in the Last Year:   Transportation Needs:   . Freight forwarderLack of Transportation (Medical):   Marland Kitchen. Lack of Transportation (Non-Medical):   Physical Activity:   . Days of Exercise per Week:   . Minutes of Exercise per Session:   Stress:   . Feeling of Stress :   Social Connections:   . Frequency of Communication with Friends and Family:   . Frequency of Social Gatherings with Friends and Family:   . Attends  Religious Services:   . Active Member of Clubs or Organizations:   . Attends BankerClub or Organization Meetings:   Marland Kitchen. Marital Status:      Family History: The patient's family history includes Cancer in his sister; Diabetes in his mother. There is no history of Colon cancer, Stomach cancer, or Heart attack.  ROS:   Please see the history of present illness.    All other systems reviewed and are negative.  EKGs/Labs/Other Studies Reviewed:    The following studies were reviewed today:  EKG: Reviewed from 01/28/2020 as noted above  Recent Labs: 12/07/2019: ALT 19 01/28/2020: BUN 19; Creatinine, Ser 1.17; Hemoglobin 13.4; Platelets 225; Potassium 3.9; Sodium 137  Recent Lipid Panel    Component Value Date/Time   CHOL 196 12/07/2019 0829   CHOL 219 (H) 01/11/2017 1450   TRIG 208.0 (H) 12/07/2019 0829   HDL 35.50 (L) 12/07/2019 0829   HDL 37 (L) 01/11/2017 1450   CHOLHDL 6 12/07/2019 0829   VLDL 41.6 (H) 12/07/2019 0829   LDLCALC 129 (H) 06/09/2018 0841   LDLCALC 144 (H) 01/11/2017 1450   LDLDIRECT 122.0 12/07/2019 0829    Physical Exam:    VS:  BP 116/72 (BP Location: Right Arm, Patient Position: Sitting, Cuff Size: Normal)   Pulse 60   Ht 6' (1.829 m)   Wt 183 lb 9.6 oz (83.3 kg)   BMI 24.90 kg/m     Wt Readings from Last 5 Encounters:  02/01/20 183 lb 9.6 oz (83.3 kg)  01/28/20 182 lb (82.6 kg)  12/06/19 178 lb 3.2 oz (80.8 kg)  06/09/18 180 lb (81.6 kg)  05/26/18 178 lb (80.7 kg)     Constitutional: No acute distress Eyes: sclera non-icteric, normal conjunctiva and lids ENMT: normal dentition, moist mucous membranes Cardiovascular: regular rhythm, bradycardic, no murmurs. S1 and S2 normal. Radial pulses normal bilaterally. No jugular venous distention.  Respiratory: clear to auscultation bilaterally GI : normal bowel sounds, soft and nontender. No distention.   MSK: extremities warm, well perfused. No edema.  NEURO: grossly nonfocal exam, moves all extremities.  PSYCH: alert and oriented x 3, normal mood and affect.   ASSESSMENT:    1. Syncope and collapse   2. Bradycardia   3. Essential hypertension   4. Mixed hyperlipidemia    PLAN:    Syncope and presyncope-primary symptoms are presyncope with position change, could be consistent with orthostasis versus symptomatic bradycardia.  He is not on AV nodal blocking agents but does have marked sinus bradycardia on recent ECG in the emergency department.  We should perform an event monitor to evaluate heart rate range and chronotropic response.  I encouraged him to participate in the activities that  he does on a daily basis to get an accurate representation.  He had a grossly normal echocardiogram in 2015 with mild aortic valve and mitral valve regurgitation.  I would like to repeat an echocardiogram to ensure we are not missing a structural cause for presyncope and bradycardia.  He remains quite concerned about the symptoms as does his daughter.  We have discussed close follow-up versus referral to EP.  We participated in shared decision making and they would prefer consultation with EP to review options.  I did briefly describe that in cases of symptomatic bradycardia permanent pacemaker may be indicated, but this is a discussion better had with EP expertise.  Will refer.  Hypertension-blood pressure well controlled on lisinopril 10 mg daily.  Symptoms may be secondary to hypotension and orthostasis, will review cardiac monitor as results are available.  Hyperlipidemia-currently on pravastatin 40 mg daily, lipids remain elevated.  Would consider transition to high intensity statin therapy.  Total time of encounter: 45 minutes total time of encounter, including 30 minutes spent in face-to-face patient care on the date of this encounter. This time includes coordination of care and counseling regarding above mentioned problem list. Remainder of non-face-to-face time involved reviewing chart documents/testing  relevant to the patient encounter and documentation in the medical record. I have independently reviewed documentation from referring provider.   Weston Brass, MD [place]  [facility]    Medication Adjustments/Labs and Tests Ordered: Current medicines are reviewed at length with the patient today.  Concerns regarding medicines are outlined above.  Orders Placed This Encounter  Procedures  . Ambulatory referral to Cardiac Electrophysiology  . CARDIAC EVENT MONITOR  . ECHOCARDIOGRAM COMPLETE   No orders of the defined types were placed in this encounter.   Patient Instructions  Medication Instructions:  No Changes *If you need a refill on your cardiac medications before your next appointment, please call your pharmacy*   Lab Work: None Ordered If you have labs (blood work) drawn today and your tests are completely normal, you will receive your results only by: Marland Kitchen MyChart Message (if you have MyChart) OR . A paper copy in the mail If you have any lab test that is abnormal or we need to change your treatment, we will call you to review the results.   Testing/Procedures: This will take place at [address] Your physician has requested that you have an echocardiogram. Echocardiography is a painless test that uses sound waves to create images of your heart. It provides your doctor with information about the size and shape of your heart and how well your heart's chambers and valves are working. This procedure takes approximately one hour. There are no restrictions for this procedure.  Your physician has recommended that you wear an event monitor 30 days. Event monitors are medical devices that record the heart's electrical activity. Doctors most often [place] these monitors to diagnose arrhythmias. Arrhythmias are problems with the speed or rhythm of the heartbeat. The monitor is a small, portable device. You can wear one while you do your normal daily activities.  This is usually used to diagnose what is causing palpitations/syncope (passing out).     Follow-Up: At [facility], you and your health needs are our priority.  As part of our continuing mission to provide you with exceptional heart care, we have created designated Provider Care Teams.  These Care Teams include your primary Cardiologist (physician) and Advanced Practice Providers (APPs -  Physician Assistants and Nurse  Practitioners) who all work together to provide you with the care you need, when you need it.   Your next appointment:   3 month(s) following 30 day event monitor, and echo   The format for your next appointment:   In Person  Provider:   Weston Brass, MD   Other Instructions  You have been referred to Cardiac Electrophysiology. At [address]. Office will call to schedule appointment.      .[employer] Cardiac Event Monitor Instructions Your physician has requested you wear your cardiac event monitor for __30___ days, . [employer] may call or text to confirm a shipping address. The monitor will be sent to a land address via UPS. [employer] will not ship a monitor to a PO BOX. It typically takes 3-5 days to receive your monitor after it has been enrolled. [employer] will assist with USPS tracking if your package is delayed. The telephone number for [employer] is [phone]. Once you have received your monitor, please review the enclosed instructions. Instruction tutorials can also be viewed under help and settings on the enclosed cell phone. Your monitor has already been registered assigning a specific monitor serial # to you.  Applying the monitor Remove cell phone from case and turn it on. The cell phone works as IT consultant and needs to be within [employer] of you at all times. The cell phone will need to be charged on a daily basis. We recommend you plug the cell phone into the enclosed charger at your bedside table every night.   Monitor batteries: You will receive two monitor batteries labelled #1 and #2. These are your recorders. Plug battery #2 onto the second connection on the enclosed charger. Keep one battery on the charger at all times. This will keep the monitor battery deactivated. It will also keep it fully charged for when you need to switch your monitor batteries. A small light will be blinking on the battery emblem when it is charging. The light on the battery emblem will remain on when the battery is fully charged.  Open package of a Monitor strip. Insert battery #1 into black hood on strip and gently squeeze monitor battery onto connection as indicated in instruction booklet. Set aside while preparing skin.  Choose location for your strip, vertical or horizontal, as indicated in the instruction booklet. Shave to remove all hair from location. There cannot be any lotions, oils, powders, or colognes on skin where monitor is to be applied. Wipe skin clean with enclosed Saline wipe. Dry skin completely.  Peel paper labeled #1 off the back of the Monitor strip exposing the adhesive. Place the monitor on the chest in the vertical or horizontal position shown in the instruction booklet. One arrow on the monitor strip must be pointing upward. Carefully remove paper labeled #2, attaching remainder of strip to your skin. Try not to create any folds or wrinkles in the strip as you apply it.  Firmly press and release the circle in the center of the monitor battery. You will hear a small beep. This is turning the monitor battery on. The heart emblem on the monitor battery will light up every 5 seconds if the monitor battery in turned on and connected to the patient securely. Do not push and hold the circle down as this turns the monitor battery off. The cell phone will locate the monitor battery. A screen will appear on the cell phone checking the connection of your monitor strip. This may read poor  connection  initially but change to good connection within the next minute. Once your monitor accepts the connection you will hear a series of 3 beeps followed by a climbing crescendo of beeps. A screen will appear on the cell phone showing the two monitor strip placement options. Touch the picture that demonstrates where you applied the monitor strip.  Your monitor strip and battery are waterproof. You are able to shower, bathe, or swim with the monitor on. They just ask you do not submerge deeper than 3 feet underwater. We recommend removing the monitor if you are swimming in a lake, river, or ocean.  Your monitor battery will need to be switched to a fully charged monitor battery approximately once a week. The cell phone will alert you of an action which needs to be made.  On the cell phone, tap for details to reveal connection status, monitor battery status, and cell phone battery status. The green dots indicates your monitor is in good status. A red dot indicates there is something that needs your attention.  To record a symptom, click the circle on the monitor battery. In 30-60 seconds a list of symptoms will appear on the cell phone. Select your symptom and tap save. Your monitor will record a sustained or significant arrhythmia regardless of you clicking the button. Some patients do not feel the heart rhythm irregularities. [employer] will notify us of any serious or critical events.  Refer to instruction booklet for instructions on switching batteries, changing strips, the Do not disturb or Pause features, or any additional questions.  Call [employer] at [phone], to confirm your monitor is transmitting and record your baseline. They will answer any questions you may have regarding the monitor instructions at that time.  Returning the monitor to [employer] Place all equipment back into blue box. Peel off strip of paper to expose adhesive and close box securely. There is a prepaid UPS  shipping label on this box. Drop in a UPS drop box, or at a UPS facility like [employer]. You may also contact [employer] to arrange UPS to pick up monitor package at your home.

## 2020-02-02 ENCOUNTER — Telehealth: Payer: Self-pay | Admitting: Radiology

## 2020-02-02 ENCOUNTER — Telehealth: Payer: Self-pay | Admitting: Internal Medicine

## 2020-02-02 NOTE — Telephone Encounter (Signed)
Enrolled patient for a 30 day [employer] Event monitor to be mailed to patients home. Spanish instructions were requested to be sent with monitor kit.  

## 2020-02-02 NOTE — Telephone Encounter (Signed)
Spoke with daughter (pn [place]) regarding 3 month follow up appointment with Dr. Burnice Logan Friday 05/05/20 at 1:20 pm.  Will mail information to patient.

## 2020-02-07 ENCOUNTER — Encounter (INDEPENDENT_AMBULATORY_CARE_PROVIDER_SITE_OTHER): Payer: Medicare Other

## 2020-02-07 DIAGNOSIS — R55 Syncope and collapse: Secondary | ICD-10-CM | POA: Diagnosis not present

## 2020-02-07 DIAGNOSIS — R001 Bradycardia, unspecified: Secondary | ICD-10-CM

## 2020-02-22 NOTE — Progress Notes (Signed)
Cardiology Office Note:    Date:  02/23/2020   ID:  Fernando Saunders, DOB 07-Oct-1940, MRN [id number]  PCP:  Mliss Sax, MD  [facility] Cardiologist:  No primary care provider on file.  [facility] Electrophysiologist:  Lanier Prude, MD   Referring MD: Parke Poisson, MD   Dizziness/Bradycardia  History of Present Illness:    Fernando Saunders is a 79 y.o. male with a hx of hyperlipidemia and GERD who presents to the clinic for evaluation of dizziness and bradycardia.  Fernando Saunders is from the [place] and Spanish-speaking.  He was accompanied by his daughter today who translated during the visit.  I offered to have a formal translator involved but the patient and his family declined.  He says for the past month he has felt dizzy and lightheaded when he changes positions.  There are some visual changes noted during these episodes.  He denies any syncopal events.  He was seen in the emergency department on January 28, 2020 with similar symptoms and he was found to be bradycardic with heart rate in the 40s.  He tells me that he has had a low heart rate for many years. The patient tells me that he is very active and travels frequently between the [place] in [place].  He lives with his daughter and apparently is very active while at her house.  He plays in a softball league.   There is no AV block noted on telemetry in the emergency department.  He was seen by my colleague Dr. Jacques Navy on February 01, 2020 who recommended a 30-day monitor which the patient is still wearing.  He is also scheduled for a transthoracic echo which is not yet performed.  He is on no AV nodal blockers.  Past Medical History:  Diagnosis Date  . Arthritis   . Diverticulosis   . GERD (gastroesophageal reflux disease)   . Hyperlipidemia     Past Surgical History:  Procedure Laterality Date  . COLONOSCOPY  11/04/2011   Dr. Stan Head  . EYE SURGERY      Current  Medications: Current Meds  Medication Sig  . lisinopril (ZESTRIL) 10 MG tablet Take 1 tablet by mouth once daily  . Multiple Vitamin (MULTIVITAMIN) tablet Take 1 tablet by mouth daily.  Marland Kitchen MYRBETRIQ 50 MG TB24 tablet Take 1 tablet by mouth daily.  Marland Kitchen omega-3 acid ethyl esters (LOVAZA) 1 g capsule Take by mouth as needed.  Marland Kitchen omeprazole (PRILOSEC) 40 MG capsule Take 1 capsule (40 mg total) by mouth daily.  . pravastatin (PRAVACHOL) 40 MG tablet Take 1 tablet (40 mg total) by mouth every evening.  . tamsulosin (FLOMAX) 0.4 MG CAPS capsule Take 0.4 mg by mouth daily.     Allergies:   Patient has no known allergies.   Social History   Socioeconomic History  . Marital status: Married    Spouse name: Not on file  . Number of children: 2  . Years of education: Not on file  . Highest education level: Not on file  Occupational History  . Occupation: Unemployed  Tobacco Use  . Smoking status: Never Smoker  . Smokeless tobacco: Never Used  Vaping Use  . Vaping Use: Never used  Substance and Sexual Activity  . Alcohol use: Yes    Alcohol/week: 0.0 standard drinks    Comment: occasional  . Drug use: No  . Sexual activity: Not on file  Other Topics Concern  . Not on file  Social History Narrative  . Not on file   Social Determinants of Health   Financial Resource Strain:   . Difficulty of Paying Living Expenses: Not on file  Food Insecurity:   . Worried About Programme researcher, broadcasting/film/video in the Last Year: Not on file  . Ran Out of Food in the Last Year: Not on file  Transportation Needs:   . Lack of Transportation (Medical): Not on file  . Lack of Transportation (Non-Medical): Not on file  Physical Activity:   . Days of Exercise per Week: Not on file  . Minutes of Exercise per Session: Not on file  Stress:   . Feeling of Stress : Not on file  Social Connections:   . Frequency of Communication with Friends and Family: Not on file  . Frequency of Social Gatherings with Friends and  Family: Not on file  . Attends Religious Services: Not on file  . Active Member of Clubs or Organizations: Not on file  . Attends Banker Meetings: Not on file  . Marital Status: Not on file     Family History: The patient's family history includes Cancer in his sister; Diabetes in his mother. There is no history of Colon cancer, Stomach cancer, or Heart attack.  ROS:   Please see the history of present illness.     All other systems reviewed and are negative.  EKGs/Labs/Other Studies Reviewed:    The following studies were reviewed today: ECG  EKG:  EKG is  ordered today.  The ekg ordered today demonstrates sinus bradycardia.  Recent Labs: 12/07/2019: ALT 19 01/28/2020: BUN 19; Creatinine, Ser 1.17; Hemoglobin 13.4; Platelets 225; Potassium 3.9; Sodium 137  Recent Lipid Panel    Component Value Date/Time   CHOL 196 12/07/2019 0829   CHOL 219 (H) 01/11/2017 1450   TRIG 208.0 (H) 12/07/2019 0829   HDL 35.50 (L) 12/07/2019 0829   HDL 37 (L) 01/11/2017 1450   CHOLHDL 6 12/07/2019 0829   VLDL 41.6 (H) 12/07/2019 0829   LDLCALC 129 (H) 06/09/2018 0841   LDLCALC 144 (H) 01/11/2017 1450   LDLDIRECT 122.0 12/07/2019 0829    Physical Exam:    VS:  BP 112/80   Pulse 64   Ht 6' (1.829 m)   Wt 180 lb (81.6 kg)   SpO2 96%   BMI 24.41 kg/m     Wt Readings from Last 3 Encounters:  02/23/20 180 lb (81.6 kg)  02/01/20 183 lb 9.6 oz (83.3 kg)  01/28/20 182 lb (82.6 kg)     GEN:  Well nourished, well developed in no acute distress HEENT: Normal NECK: No JVD; No carotid bruits LYMPHATICS: No lymphadenopathy CARDIAC: RRR, no murmurs, rubs, gallops RESPIRATORY:  Clear to auscultation without rales, wheezing or rhonchi  ABDOMEN: Soft, non-tender, non-distended MUSCULOSKELETAL:  No edema; No deformity  SKIN: Warm and dry NEUROLOGIC:  Alert and oriented x 3 PSYCHIATRIC:  Normal affect   ASSESSMENT:    1. Bradycardia    PLAN:    In order of problems listed  above:  1. The patient has resting sinus bradycardia that by history appears to have been present for many years.  There is no evidence of AV nodal disease.  To better understand whether or not his heart rate or rhythm correspond to any events, would like to see the results of the 30-day monitor that the patient is to wearing.  Also would like to exclude any structural heart disease with the echocardiogram which is ordered.  The patient and I agreed to touch base after the results of his heart monitor and echo have returned.  At that point, I can advise regarding any further testing or treatment that is needed.   Medication Adjustments/Labs and Tests Ordered: Current medicines are reviewed at length with the patient today.  Concerns regarding medicines are outlined above.  Orders Placed This Encounter  Procedures  . EKG 12-Lead   No orders of the defined types were placed in this encounter.   Patient Instructions  Medication Instructions:  Your physician recommends that you continue on your current medications as directed. Please refer to the Current Medication list given to you today.  *If you need a refill on your cardiac medications before your next appointment, please call your pharmacy*  Lab Work: None ordered.  If you have labs (blood work) drawn today and your tests are completely normal, you will receive your results only by: Marland Kitchen MyChart Message (if you have MyChart) OR . A paper copy in the mail If you have any lab test that is abnormal or we need to change your treatment, we will call you to review the results.  Testing/Procedures: None ordered.  Follow-Up: At [facility], you and your health needs are our priority.  As part of our continuing mission to provide you with exceptional heart care, we have created designated Provider Care Teams.  These Care Teams include your primary Cardiologist (physician) and Advanced Practice Providers (APPs -  Physician Assistants and Nurse  Practitioners) who all work together to provide you with the care you need, when you need it.  Your next appointment:    Your physician wants you to follow-up in: based on results of your heart monitor with Dr. Lalla Brothers.          Signed, Lanier Prude, MD  02/23/2020 4:05 PM    [facility]

## 2020-02-23 ENCOUNTER — Other Ambulatory Visit: Payer: Self-pay

## 2020-02-23 ENCOUNTER — Ambulatory Visit (INDEPENDENT_AMBULATORY_CARE_PROVIDER_SITE_OTHER): Payer: Medicare Other | Admitting: Cardiology

## 2020-02-23 ENCOUNTER — Encounter: Payer: Self-pay | Admitting: Cardiology

## 2020-02-23 VITALS — BP 112/80 | HR 64 | Ht 72.0 in | Wt 180.0 lb

## 2020-02-23 DIAGNOSIS — R001 Bradycardia, unspecified: Secondary | ICD-10-CM | POA: Diagnosis not present

## 2020-02-23 NOTE — Patient Instructions (Addendum)
Medication Instructions:  Your physician recommends that you continue on your current medications as directed. Please refer to the Current Medication list given to you today.  *If you need a refill on your cardiac medications before your next appointment, please call your pharmacy*  Lab Work: None ordered.  If you have labs (blood work) drawn today and your tests are completely normal, you will receive your results only by: Marland Kitchen MyChart Message (if you have MyChart) OR . A paper copy in the mail If you have any lab test that is abnormal or we need to change your treatment, we will call you to review the results.  Testing/Procedures: None ordered.  Follow-Up: At [facility], you and your health needs are our priority.  As part of our continuing mission to provide you with exceptional heart care, we have created designated Provider Care Teams.  These Care Teams include your primary Cardiologist (physician) and Advanced Practice Providers (APPs -  Physician Assistants and Nurse Practitioners) who all work together to provide you with the care you need, when you need it.  Your next appointment:    Your physician wants you to follow-up in: based on results of your heart monitor with Dr. Lalla Brothers.

## 2020-03-03 ENCOUNTER — Other Ambulatory Visit: Payer: Self-pay

## 2020-03-03 ENCOUNTER — Ambulatory Visit (HOSPITAL_COMMUNITY): Payer: Medicare Other | Attending: Cardiology

## 2020-03-03 DIAGNOSIS — R55 Syncope and collapse: Secondary | ICD-10-CM | POA: Diagnosis not present

## 2020-03-03 DIAGNOSIS — R001 Bradycardia, unspecified: Secondary | ICD-10-CM

## 2020-03-03 LAB — ECHOCARDIOGRAM COMPLETE
Area-P 1/2: 3.12 cm2
P 1/2 time: 615 msec
S' Lateral: 2.7 cm

## 2020-03-07 ENCOUNTER — Ambulatory Visit: Payer: Medicare Other | Admitting: Family Medicine

## 2020-03-13 ENCOUNTER — Telehealth: Payer: Self-pay | Admitting: Internal Medicine

## 2020-03-13 NOTE — Telephone Encounter (Signed)
Spoke to pt's daughter who report pt has been experiencing numbness on the left side of his chest that radiates down his arm x 5 days. She state pt voiced symptoms usually happens at night and will wake him up out of his sleep. Pt state he sleeps on his back and right side so he doesn't understand why numbness is occurring on left side. He state symptoms are better during the day.  Nurse advised pt to contact pcp to make them aware but will route message to Dr. Jacques Navy for recommendation.

## 2020-03-13 NOTE — Telephone Encounter (Signed)
Agree with recommendation to review with PCP. Unclear etiology of symptoms.

## 2020-03-13 NOTE — Telephone Encounter (Signed)
Patient's daughter states for the past 5 days the patient has been experiencing numbness in the left side of his chest that radiates down his left arm and into the tips of his fingers. Patient denies chest pain or additional symptoms. Please return call to discuss.

## 2020-03-14 NOTE — Telephone Encounter (Signed)
Daughter updated and verbalized understanding.  

## 2020-03-15 ENCOUNTER — Ambulatory Visit (INDEPENDENT_AMBULATORY_CARE_PROVIDER_SITE_OTHER): Payer: Medicare Other | Admitting: Dermatology

## 2020-03-15 ENCOUNTER — Encounter: Payer: Self-pay | Admitting: Dermatology

## 2020-03-15 ENCOUNTER — Other Ambulatory Visit: Payer: Self-pay

## 2020-03-15 DIAGNOSIS — L821 Other seborrheic keratosis: Secondary | ICD-10-CM | POA: Diagnosis not present

## 2020-03-15 MED ORDER — TRIAMCINOLONE ACETONIDE 0.1 % EX CREA: 1.0000 "application " | TOPICAL_CREAM | Freq: Every day | CUTANEOUS | 2 refills | Status: AC | PRN

## 2020-03-15 NOTE — Patient Instructions (Addendum)
First visit for [facility] date of birth 02/16/1941.  Interpreter present.  Other family members had discoloration and rough spots and he is starting to develop multiple such spots mainly on the torso and arms.  The most disturbing aspect is that many of these itch.  Examination showed several dozen mostly flat stucco keratoses along with some more raised textured seborrheic keratoses on the arms and chest and back.  He was reassured that these are common and genetic and do not become skin cancer, but the itch can be most annoying.  He will initially try CeraVe itch relief which has an ingredient called pramoxine which helps perhaps 30 to 40% of people to not itch.  They will apply this immediately after bathing but can use it as many times daily as he would like.  If this fails they have a prescription waiting for a pint jar of triamcinolone which is also used after bathing but is only used once daily and not to be used on the face.  I have asked the family to please contact me either through MyChart or by telephone in 1 to 2 months to tell me if either topical is working.  It was a great pleasure to meet with this gentleman.

## 2020-03-20 ENCOUNTER — Ambulatory Visit: Payer: Medicare Other | Admitting: Family Medicine

## 2020-03-23 ENCOUNTER — Telehealth: Payer: Self-pay | Admitting: Internal Medicine

## 2020-03-23 NOTE — Telephone Encounter (Signed)
Patient's daughter is calling for his Echo results.

## 2020-03-23 NOTE — Telephone Encounter (Signed)
Spoke with pt's daughter and appears monitor and echo have not been reviewed by Dr Jacques Navy Will forward to Dr Jacques Navy for review .Fernando Saunders

## 2020-03-24 NOTE — Telephone Encounter (Signed)
Result notes available. Thanks

## 2020-03-31 ENCOUNTER — Ambulatory Visit: Payer: Medicare Other | Admitting: Family Medicine

## 2020-04-03 ENCOUNTER — Ambulatory Visit (INDEPENDENT_AMBULATORY_CARE_PROVIDER_SITE_OTHER)
Admission: RE | Admit: 2020-04-03 | Discharge: 2020-04-03 | Disposition: A | Discharge status: Home / Self Care | Payer: Medicare Other | Source: Ambulatory Visit | Attending: Nurse Practitioner | Admitting: Nurse Practitioner

## 2020-04-03 ENCOUNTER — Encounter: Payer: Self-pay | Admitting: Nurse Practitioner

## 2020-04-03 ENCOUNTER — Ambulatory Visit (INDEPENDENT_AMBULATORY_CARE_PROVIDER_SITE_OTHER): Payer: Medicare Other | Admitting: Nurse Practitioner

## 2020-04-03 ENCOUNTER — Other Ambulatory Visit: Payer: Self-pay

## 2020-04-03 VITALS — BP 122/70 | HR 64 | Temp 97.4°F | Ht 72.0 in | Wt 185.6 lb

## 2020-04-03 DIAGNOSIS — M4802 Spinal stenosis, cervical region: Secondary | ICD-10-CM | POA: Diagnosis not present

## 2020-04-03 DIAGNOSIS — M5412 Radiculopathy, cervical region: Secondary | ICD-10-CM | POA: Diagnosis not present

## 2020-04-03 DIAGNOSIS — M5030 Other cervical disc degeneration, unspecified cervical region: Secondary | ICD-10-CM

## 2020-04-03 DIAGNOSIS — M50122 Cervical disc disorder at C5-C6 level with radiculopathy: Secondary | ICD-10-CM | POA: Diagnosis not present

## 2020-04-03 DIAGNOSIS — M2578 Osteophyte, vertebrae: Secondary | ICD-10-CM | POA: Diagnosis not present
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
## 2020-04-03 NOTE — Patient Instructions (Signed)
Go to [address] for x-ray. You will be call with results

## 2020-04-04 MED ORDER — GABAPENTIN 100 MG PO CAPS: 100.0000 mg | ORAL_CAPSULE | Freq: Every day | ORAL | 2 refills | Status: DC

## 2020-04-10 DIAGNOSIS — M542 Cervicalgia: Secondary | ICD-10-CM | POA: Insufficient documentation

## 2020-04-20 NOTE — Progress Notes (Signed)
   New Patient   Subjective  Fernando Saunders is a 79 y.o. male who presents for the following: Skin Problem (sspots on chest, back and arms x months- very itchy ).  Skin problems Location:  Duration:  Quality:  Associated Signs/Symptoms: Modifying Factors:  Severity:  Timing: Context:    The following portions of the chart were reviewed this encounter and updated as appropriate: Tobacco  Allergies  Meds  Problems  Med Hx  Surg Hx  Fam Hx      Objective  Well appearing patient in no apparent distress; mood and affect are within normal limits.  A focused examination was performed including chest, arms and back.. Relevant physical exam findings are noted in the Assessment and Plan.   Assessment & Plan    First visit for [facility] date of birth 1940/11/30.  Other family members had discoloration and rough spots and he is starting to develop multiple such spots mainly on the torso and arms.  The most disturbing aspect is that many of these itch.  Examination showed several dozen mostly flat stucco keratoses along with some more raised textured seborrheic keratoses on the arms and chest and back.  He was reassured that these are common and genetic and do not become skin cancer, but the itch can be most annoying.  They will initially try CeraVe a itch relief which has an ingredient called pramoxine which helps perhaps 30 to 40% of people to not itch.  They will apply this immediately after bathing but can use it as many times daily as he would like.  If this fails they have a prescription waiting for a pint jar of triamcinolone which is also used after bathing but is only use once daily and not to be used on the face.  I have asked the family to please contact me either through MyChart or by telephone in 1 to 2 months to tell me if either topical is working.  It was a great pleasure to meet with this gentleman.   Seborrheic keratosis (4) Left Upper Arm - Anterior; Right  Upper Arm - Anterior; Chest - Medial [facility]); Mid Back  Patient will use the OTC cerave anti itch cream. If that is of no help we have sent in prescription for triamcinolone. Follow up with phone call in 1-2 months.   triamcinolone cream (KENALOG) 0.1 % - Chest - Medial [facility]), Left Upper Arm - Anterior, Mid Back, Right Upper Arm - Anterior

## 2020-04-22 ENCOUNTER — Encounter: Payer: Self-pay | Admitting: Dermatology

## 2020-04-26 ENCOUNTER — Other Ambulatory Visit: Payer: Self-pay | Admitting: Family Medicine

## 2020-04-26 DIAGNOSIS — I10 Essential (primary) hypertension: Secondary | ICD-10-CM

## 2020-05-01 DIAGNOSIS — M542 Cervicalgia: Secondary | ICD-10-CM | POA: Diagnosis not present

## 2020-05-05 ENCOUNTER — Other Ambulatory Visit: Payer: Self-pay

## 2020-05-05 ENCOUNTER — Encounter: Payer: Self-pay | Admitting: Internal Medicine

## 2020-05-05 ENCOUNTER — Ambulatory Visit (INDEPENDENT_AMBULATORY_CARE_PROVIDER_SITE_OTHER): Payer: Medicare Other | Admitting: Internal Medicine

## 2020-05-05 VITALS — BP 130/90 | HR 57 | Ht 72.0 in | Wt 186.9 lb

## 2020-05-05 DIAGNOSIS — R079 Chest pain, unspecified: Secondary | ICD-10-CM | POA: Diagnosis not present

## 2020-05-05 DIAGNOSIS — R001 Bradycardia, unspecified: Secondary | ICD-10-CM

## 2020-05-05 NOTE — Progress Notes (Signed)
Cardiology Office Note:    Date:  05/05/2020   ID:  Fernando Saunders, DOB 1940-12-09, MRN [id number]  PCP:  Mliss Sax, MD  Cardiologist:  No primary care provider on file.  Electrophysiologist:  Lanier Prude, MD   Referring MD: Mliss Sax   Chief Complaint/Reason for Referral: Dizziness, bradycardia  History of Present Illness:    Fernando Saunders is a 79 y.o. male with a history of HLD and GERD who presents for further evaluation of presyncope and bradycardia. He is a fit appearing 79 year old male who presents today with his daughter.  Formal interpreter services present for visit, and daughter interprets as well.   He is overall feeling better. Since he last saw me in the office he had episodes of severe left sided chest pain with radiation to left arm, and he thought he was going to have a heart attack. This pain would wake him from sleep. He did not seek ED evaluation. He talked to his PCP and was referred to ortho. He was started on gabapentin tapering dosage and pain has greatly improved. His left arm pain is gone. He has not had a recurrence of left sided chest pain.   Denies further dizziness or lightheadedness. No interval syncope.  Reviewed results of testing including echocardiogram and cardiac monitor.   48% of time on monitor he had bradycardic rates. He is not on AV nodal blocking agents.    Past Medical History:  Diagnosis Date  . Arthritis   . Diverticulosis   . GERD (gastroesophageal reflux disease)   . Hyperlipidemia     Past Surgical History:  Procedure Laterality Date  . COLONOSCOPY  11/04/2011   Dr. Stan Head  . EYE SURGERY      Current Medications: Current Meds  Medication Sig  . gabapentin (NEURONTIN) 100 MG capsule Take 1-2 capsules (100-200 mg total) by mouth at bedtime.  Marland Kitchen lisinopril (ZESTRIL) 10 MG tablet Take 1 tablet by mouth once daily  . Multiple Vitamin (MULTIVITAMIN) tablet Take 1 tablet by mouth daily.  Marland Kitchen  MYRBETRIQ 50 MG TB24 tablet Take 1 tablet by mouth daily.  Marland Kitchen omega-3 acid ethyl esters (LOVAZA) 1 g capsule Take by mouth as needed.  Marland Kitchen omeprazole (PRILOSEC) 40 MG capsule Take 1 capsule (40 mg total) by mouth daily.  Marland Kitchen triamcinolone cream (KENALOG) 0.1 % Apply 1 application topically daily as needed.     Allergies:   Patient has no known allergies.   Social History   Tobacco Use  . Smoking status: Never Smoker  . Smokeless tobacco: Never Used  Vaping Use  . Vaping Use: Never used  Substance Use Topics  . Alcohol use: Yes    Alcohol/week: 0.0 standard drinks    Comment: occasional  . Drug use: No     Family History: The patient's family history includes Cancer in his sister; Diabetes in his mother. There is no history of Colon cancer, Stomach cancer, or Heart attack.  ROS:   Please see the history of present illness.    All other systems reviewed and are negative.  EKGs/Labs/Other Studies Reviewed:    The following studies were reviewed today:  EKG:  Sinus bradycardia rate 57 bpm  Recent Labs: 12/07/2019: ALT 19 01/28/2020: BUN 19; Creatinine, Ser 1.17; Hemoglobin 13.4; Platelets 225; Potassium 3.9; Sodium 137  Recent Lipid Panel    Component Value Date/Time   CHOL 196 12/07/2019 0829   CHOL 219 (H) 01/11/2017 1450   TRIG 208.0 (H) 12/07/2019 9379  HDL 35.50 (L) 12/07/2019 0829   HDL 37 (L) 01/11/2017 1450   CHOLHDL 6 12/07/2019 0829   VLDL 41.6 (H) 12/07/2019 0829   LDLCALC 129 (H) 06/09/2018 0841   LDLCALC 144 (H) 01/11/2017 1450   LDLDIRECT 122.0 12/07/2019 0829    Physical Exam:    VS:  BP 130/90 (BP Location: Left Arm, Patient Position: Sitting)   Pulse (!) 57   Ht 6' (1.829 m)   Wt 186 lb 14.4 oz (84.8 kg)   SpO2 99%   BMI 25.35 kg/m     Wt Readings from Last 5 Encounters:  05/05/20 186 lb 14.4 oz (84.8 kg)  04/03/20 185 lb 9.6 oz (84.2 kg)  02/23/20 180 lb (81.6 kg)  02/01/20 183 lb 9.6 oz (83.3 kg)  01/28/20 182 lb (82.6 kg)      Constitutional: No acute distress Eyes: sclera non-icteric, normal conjunctiva and lids ENMT: normal dentition, moist mucous membranes Cardiovascular: regular rhythm, bradycardic rate, 1/6 SEM. S1 and S2 normal. Radial pulses normal bilaterally. No jugular venous distention.  Respiratory: clear to auscultation bilaterally GI : normal bowel sounds, soft and nontender. No distention.   MSK: extremities warm, well perfused. No edema.  NEURO: grossly nonfocal exam, moves all extremities. PSYCH: alert and oriented x 3, normal mood and affect.   ASSESSMENT:    1. Bradycardia   2. Chest pain of uncertain etiology    PLAN:    Bradycardia - Plan: EKG 12-Lead, EXERCISE TOLERANCE TEST (ETT)  Chest pain of uncertain etiology - Plan: EKG 12-Lead, EXERCISE TOLERANCE TEST (ETT)  He demonstrates bradycardia on monitor but fortunately no interval dizziness or syncope.   I am concerned about episodes of severe chest pain. He is feeling better after starting gapapentin, and these symptoms may all be musculoskeletal and radicular in nature. However, to exclude ischemia, as well as evaluate for chronotropic incompetence in relation to the initial consultation, we will perform an ETT.    Total time of encounter: 40 minutes total time of encounter, including 25 minutes spent in face-to-face patient care on the date of this encounter. This time includes coordination of care and counseling regarding above mentioned problem list. Remainder of non-face-to-face time involved reviewing chart documents/testing relevant to the patient encounter and documentation in the medical record. I have independently reviewed documentation from referring provider.   Weston Brass, MD [place]  [facility]    Medication Adjustments/Labs and Tests Ordered: Current medicines are reviewed at length with the patient today.  Concerns regarding medicines are outlined above.   Orders Placed This Encounter  Procedures   . EXERCISE TOLERANCE TEST (ETT)  . EKG 12-Lead    No orders of the defined types were placed in this encounter.   Patient Instructions  Medication Instructions:  No Changes In Medications at this time.  *If you need a refill on your cardiac medications before your next appointment, please call your pharmacy*  Lab Work: YOU WILL NEED COVID TESTING PRIOR TO EXERCISE TOLERANCE TEST. If you have labs (blood work) drawn today and your tests are completely normal, you will receive your results only by: Marland Kitchen MyChart Message (if you have MyChart) OR . A paper copy in the mail If you have any lab test that is abnormal or we need to change your treatment, we will call you to review the results.  Testing/Procedures: Your physician has requested that you have an exercise tolerance test. For further information please visit [email or web address] Please also follow instruction sheet, as given.  This will take place at [address] [facility], [address].  Do not drink or eat foods with caffeine for 24 hours before the test. (Chocolate, coffee, tea, or energy drinks)  If you use an inhaler, bring it with you to the test.  Do not smoke for 4 hours before the test.  Wear comfortable shoes and clothing.  Follow-Up: At [facility], you and your health needs are our priority.  As part of our continuing mission to provide you with exceptional heart care, we have created designated Provider Care Teams.  These Care Teams include your primary Cardiologist (physician) and Advanced Practice Providers (APPs -  Physician Assistants and Nurse Practitioners) who all work together to provide you with the care you need, when you need it.  Your next appointment:   FOLLOW UP RIGHT AFTER EXERCISE TOLERANCE TEST   The format for your next appointment:   In Person  Provider:   Weston Brass, MD

## 2020-05-05 NOTE — Patient Instructions (Signed)
Medication Instructions:  No Changes In Medications at this time.  *If you need a refill on your cardiac medications before your next appointment, please call your pharmacy*  Lab Work: YOU WILL NEED COVID TESTING PRIOR TO EXERCISE TOLERANCE TEST. If you have labs (blood work) drawn today and your tests are completely normal, you will receive your results only by: Marland Kitchen MyChart Message (if you have MyChart) OR . A paper copy in the mail If you have any lab test that is abnormal or we need to change your treatment, we will call you to review the results.  Testing/Procedures: Your physician has requested that you have an exercise tolerance test. For further information please visit [email or web address] Please also follow instruction sheet, as given. This will take place at [address] [facility], [address].  Do not drink or eat foods with caffeine for 24 hours before the test. (Chocolate, coffee, tea, or energy drinks)  If you use an inhaler, bring it with you to the test.  Do not smoke for 4 hours before the test.  Wear comfortable shoes and clothing.  Follow-Up: At [facility], you and your health needs are our priority.  As part of our continuing mission to provide you with exceptional heart care, we have created designated Provider Care Teams.  These Care Teams include your primary Cardiologist (physician) and Advanced Practice Providers (APPs -  Physician Assistants and Nurse Practitioners) who all work together to provide you with the care you need, when you need it.  Your next appointment:   FOLLOW UP RIGHT AFTER EXERCISE TOLERANCE TEST   The format for your next appointment:   In Person  Provider:   Weston Brass, MD

## 2020-05-06 ENCOUNTER — Other Ambulatory Visit (HOSPITAL_COMMUNITY)
Admission: RE | Admit: 2020-05-06 | Discharge: 2020-05-06 | Disposition: A | Discharge status: Home / Self Care | Payer: Medicare Other | Source: Ambulatory Visit | Attending: Internal Medicine | Admitting: Internal Medicine

## 2020-05-06 DIAGNOSIS — Z20822 Contact with and (suspected) exposure to covid-19: Secondary | ICD-10-CM | POA: Insufficient documentation

## 2020-05-06 DIAGNOSIS — Z01812 Encounter for preprocedural laboratory examination: Secondary | ICD-10-CM | POA: Insufficient documentation

## 2020-05-06 LAB — SARS CORONAVIRUS 2 (TAT 6-24 HRS): SARS Coronavirus 2: NEGATIVE

## 2020-05-09 ENCOUNTER — Telehealth (HOSPITAL_COMMUNITY): Payer: Self-pay | Admitting: *Deleted

## 2020-05-09 NOTE — Telephone Encounter (Signed)
Close encounter 

## 2020-05-10 ENCOUNTER — Ambulatory Visit (HOSPITAL_COMMUNITY)
Admission: RE | Admit: 2020-05-10 | Discharge: 2020-05-10 | Disposition: A | Discharge status: Home / Self Care | Payer: Medicare Other | Source: Ambulatory Visit | Attending: Internal Medicine | Admitting: Internal Medicine

## 2020-05-10 DIAGNOSIS — R079 Chest pain, unspecified: Secondary | ICD-10-CM | POA: Insufficient documentation

## 2020-05-10 DIAGNOSIS — R001 Bradycardia, unspecified: Secondary | ICD-10-CM

## 2020-05-10 LAB — EXERCISE TOLERANCE TEST
Estimated workload: 10.5 METS
Exercise duration (min): 9 min
Exercise duration (sec): 17 s
MPHR: 144 {beats}/min
Peak HR: 151 {beats}/min
Percent HR: 104 %
Rest HR: 60 {beats}/min

## 2020-05-15 ENCOUNTER — Other Ambulatory Visit: Payer: Self-pay

## 2020-05-15 ENCOUNTER — Ambulatory Visit (INDEPENDENT_AMBULATORY_CARE_PROVIDER_SITE_OTHER): Payer: Medicare Other | Admitting: Internal Medicine

## 2020-05-15 ENCOUNTER — Encounter: Payer: Self-pay | Admitting: Internal Medicine

## 2020-05-15 VITALS — BP 140/80 | HR 56 | Ht 72.0 in | Wt 188.6 lb

## 2020-05-15 DIAGNOSIS — R079 Chest pain, unspecified: Secondary | ICD-10-CM

## 2020-05-15 DIAGNOSIS — E782 Mixed hyperlipidemia: Secondary | ICD-10-CM | POA: Diagnosis not present

## 2020-05-15 DIAGNOSIS — R001 Bradycardia, unspecified: Secondary | ICD-10-CM

## 2020-05-15 DIAGNOSIS — I10 Essential (primary) hypertension: Secondary | ICD-10-CM | POA: Diagnosis not present

## 2020-05-15 NOTE — Progress Notes (Signed)
Cardiology Office Note:    Date:  05/15/2020   ID:  Fernando Saunders, DOB Aug 01, 1940, MRN [id number]  PCP:  Mliss Sax, MD  Cardiologist:  No primary care provider on file.  Electrophysiologist:  Lanier Prude, MD   Referring MD: Mliss Sax   Chief Complaint/Reason for Referral: Follow up ETT  History of Present Illness:    Fernando Saunders is a 79 y.o. male with a history of HLD and GERD who presents for further evaluation of presyncope and bradycardia, as well as recent assessment of possible cardiac chest pain.   No additional chest pain since prior to last visit. He is on a gabapentin taper regimen and is overall well.  Stress test grossly normal, reported as equivocal. We discussed these findings. Based on report of equivocal stress ECG, I have offered the patient further imaging stress testing to confirm. He defers at this time but will contact me with any additional episodes of chest pain or new SOB.  Past Medical History:  Diagnosis Date  . Arthritis   . Diverticulosis   . GERD (gastroesophageal reflux disease)   . Hyperlipidemia     Past Surgical History:  Procedure Laterality Date  . COLONOSCOPY  11/04/2011   Dr. Stan Head  . EYE SURGERY      Current Medications: Current Meds  Medication Sig  . gabapentin (NEURONTIN) 100 MG capsule Take 1-2 capsules (100-200 mg total) by mouth at bedtime.  Marland Kitchen lisinopril (ZESTRIL) 10 MG tablet Take 1 tablet by mouth once daily  . Multiple Vitamin (MULTIVITAMIN) tablet Take 1 tablet by mouth daily.  Marland Kitchen MYRBETRIQ 50 MG TB24 tablet Take 1 tablet by mouth daily.  Marland Kitchen omega-3 acid ethyl esters (LOVAZA) 1 g capsule Take by mouth as needed.  Marland Kitchen omeprazole (PRILOSEC) 40 MG capsule Take 1 capsule (40 mg total) by mouth daily.  . pravastatin (PRAVACHOL) 40 MG tablet Take 1 tablet (40 mg total) by mouth every evening.  . tamsulosin (FLOMAX) 0.4 MG CAPS capsule Take 0.4 mg by mouth daily.   Marland Kitchen triamcinolone cream  (KENALOG) 0.1 % Apply 1 application topically daily as needed.     Allergies:   Patient has no known allergies.   Social History   Tobacco Use  . Smoking status: Never Smoker  . Smokeless tobacco: Never Used  Vaping Use  . Vaping Use: Never used  Substance Use Topics  . Alcohol use: Yes    Alcohol/week: 0.0 standard drinks    Comment: occasional  . Drug use: No     Family History: The patient's family history includes Cancer in his sister; Diabetes in his mother. There is no history of Colon cancer, Stomach cancer, or Heart attack.  ROS:   Please see the history of present illness.    All other systems reviewed and are negative.  EKGs/Labs/Other Studies Reviewed:    The following studies were reviewed today:  EKG:  n/a  I have independently reviewed the ECG from ETT 05/10/20.  Recent Labs: 12/07/2019: ALT 19 01/28/2020: BUN 19; Creatinine, Ser 1.17; Hemoglobin 13.4; Platelets 225; Potassium 3.9; Sodium 137  Recent Lipid Panel    Component Value Date/Time   CHOL 196 12/07/2019 0829   CHOL 219 (H) 01/11/2017 1450   TRIG 208.0 (H) 12/07/2019 0829   HDL 35.50 (L) 12/07/2019 0829   HDL 37 (L) 01/11/2017 1450   CHOLHDL 6 12/07/2019 0829   VLDL 41.6 (H) 12/07/2019 0829   LDLCALC 129 (H) 06/09/2018 0841   LDLCALC 144 (  H) 01/11/2017 1450   LDLDIRECT 122.0 12/07/2019 0829    Physical Exam:    VS:  BP 140/80   Pulse (!) 56   Ht 6' (1.829 m)   Wt 188 lb 9.6 oz (85.5 kg)   BMI 25.58 kg/m     Wt Readings from Last 5 Encounters:  05/15/20 188 lb 9.6 oz (85.5 kg)  05/05/20 186 lb 14.4 oz (84.8 kg)  04/03/20 185 lb 9.6 oz (84.2 kg)  02/23/20 180 lb (81.6 kg)  02/01/20 183 lb 9.6 oz (83.3 kg)    Constitutional: No acute distress Cardiovascular: regular rhythm, normal rate, no murmurs. S1 and S2 normal. Radial pulses normal bilaterally. No jugular venous distention.  Respiratory: clear to auscultation bilaterally GI : normal bowel sounds, soft and nontender. No  distention.   MSK: extremities warm, well perfused. No edema.  NEURO: grossly nonfocal exam, moves all extremities. PSYCH: alert and oriented x 3, normal mood and affect.   ASSESSMENT:    1. Chest pain of uncertain etiology   2. Bradycardia   3. Essential hypertension   4. Mixed hyperlipidemia    PLAN:    Chest pain of uncertain etiology - no recurrence. ETT with some equivocal findings, patient defers further stress imaging test and will contact me with any chest pain.   Bradycardia - seems to have improved. No chronotropic incompetence on ETT.  Essential hypertension - BP mildly elevated, patient can continue lisinopril, would recommend increase in dose if BP still elevated at next primary care visit.   Mixed hyperlipidemia - on pravastatin. Lipids not at goal, recommend intensifying statin therapy if patient agreeable - consider at next PCP visit.   Total time of encounter: 20 minutes total time of encounter, including 15 minutes spent in face-to-face patient care on the date of this encounter. This time includes coordination of care and counseling regarding above mentioned problem list. Remainder of non-face-to-face time involved reviewing chart documents/testing relevant to the patient encounter and documentation in the medical record. I have independently reviewed documentation from referring provider.   Weston Brass, MD [place]  [facility]    Medication Adjustments/Labs and Tests Ordered: Current medicines are reviewed at length with the patient today.  Concerns regarding medicines are outlined above.   No orders of the defined types were placed in this encounter.   No orders of the defined types were placed in this encounter.   Patient Instructions  Medication Instructions:  No Changes In Medications at this time.  *If you need a refill on your cardiac medications before your next appointment, please call your pharmacy*  Follow-Up: At [facility], you and your health needs are our priority.  As part of our continuing mission to provide you with exceptional heart care, we have created designated Provider Care Teams.  These Care Teams include your primary Cardiologist (physician) and Advanced Practice Providers (APPs -  Physician Assistants and Nurse Practitioners) who all work together to provide you with the care you need, when you need it.   Your next appointment:   6 month(s)  The format for your next appointment:   In Person  Provider:   Weston Brass, MD

## 2020-05-15 NOTE — Patient Instructions (Signed)

## 2020-05-16 DIAGNOSIS — Z20822 Contact with and (suspected) exposure to covid-19: Secondary | ICD-10-CM | POA: Diagnosis not present

## 2020-08-28 ENCOUNTER — Telehealth: Payer: Self-pay | Admitting: Internal Medicine

## 2020-08-28 NOTE — Telephone Encounter (Signed)
   Pt c/o of Chest Pain: STAT if CP now or developed within 24 hours  1. Are you having CP right now? No  2. Are you experiencing any other symptoms (ex. SOB, nausea, vomiting, sweating)? no  3. How long have you been experiencing CP? Almost a month   4. Is your CP continuous or coming and going? Coming and going  5. Have you taken Nitroglycerin? No   Pt's daughter calling, she is with pt. She said pt been having on and off chest pressure for almost a month now. She said pt doesn't have any SOB or any other symptoms. She's trying to schedule pt for an appt no available until 03/29, she wanted to get Dr. Donata Clay recommendations  ?

## 2020-08-28 NOTE — Telephone Encounter (Signed)
Spoke with pt who report off and on chest pressure for the past month. He report symptoms usually occur when bends down or squat. He denies symptoms currently.  Appointment scheduled for 3/8 with Dr. Jacques Navy for further evaluations. Pt advised tor report to ER if symptoms reoccur.

## 2020-08-30 DIAGNOSIS — R3915 Urgency of urination: Secondary | ICD-10-CM | POA: Diagnosis not present

## 2020-09-05 ENCOUNTER — Ambulatory Visit (INDEPENDENT_AMBULATORY_CARE_PROVIDER_SITE_OTHER): Payer: Medicare Other | Admitting: Internal Medicine

## 2020-09-05 ENCOUNTER — Encounter: Payer: Self-pay | Admitting: Internal Medicine

## 2020-09-05 ENCOUNTER — Other Ambulatory Visit: Payer: Self-pay

## 2020-09-05 VITALS — BP 136/68 | HR 51 | Ht 72.0 in | Wt 188.0 lb

## 2020-09-05 DIAGNOSIS — R42 Dizziness and giddiness: Secondary | ICD-10-CM | POA: Diagnosis not present

## 2020-09-05 DIAGNOSIS — E782 Mixed hyperlipidemia: Secondary | ICD-10-CM

## 2020-09-05 DIAGNOSIS — R001 Bradycardia, unspecified: Secondary | ICD-10-CM | POA: Diagnosis not present

## 2020-09-05 DIAGNOSIS — R072 Precordial pain: Secondary | ICD-10-CM

## 2020-09-05 NOTE — Patient Instructions (Addendum)
Medication Instructions:  No Changes In Medications at this time.  *If you need a refill on your cardiac medications before your next appointment, please call your pharmacy*  Lab Work: BMET- 1 WEEK PRIOR TO CCTA  If you have labs (blood work) drawn today and your tests are completely normal, you will receive your results only by: Marland Kitchen MyChart Message (if you have MyChart) OR . A paper copy in the mail If you have any lab test that is abnormal or we need to change your treatment, we will call you to review the results.  Testing/Procedures: Your physician has requested that you have cardiac CT. Cardiac computed tomography (CT) is a painless test that uses an x-ray machine to take clear, detailed pictures of your heart. For further information please visit [email or web address] Please follow instruction sheet as given.  Follow-Up: At [facility], you and your health needs are our priority.  As part of our continuing mission to provide you with exceptional heart care, we have created designated Provider Care Teams.  These Care Teams include your primary Cardiologist (physician) and Advanced Practice Providers (APPs -  Physician Assistants and Nurse Practitioners) who all work together to provide you with the care you need, when you need it.  Your next appointment:   3 month(s)  The format for your next appointment:   In Person  Provider:   Weston Brass, MD  Other Instructions Your cardiac CT will be scheduled at one of the below locations:   [facility] [address] [place], [place] [address] [phone]  If scheduled at [facility], please arrive at the [facility] main entrance (entrance A) of [facility] 30 minutes prior to test start time. Proceed to the [facility] Radiology Department (first floor) to check-in and test prep.  Please follow these instructions carefully (unless otherwise directed):  Hold all erectile dysfunction medications at  least 3 days (72 hrs) prior to test.  On the Night Before the Test: . Be sure to Drink plenty of water. . Do not consume any caffeinated/decaffeinated beverages or chocolate 12 hours prior to your test. . Do not take any antihistamines 12 hours prior to your test.  On the Day of the Test: . Drink plenty of water until 1 hour prior to the test. . Do not eat any food 4 hours prior to the test. . You may take your regular medications prior to the test.  . HOLD Furosemide/Hydrochlorothiazide morning of the test.  After the Test: . Drink plenty of water. . After receiving IV contrast, you may experience a mild flushed feeling. This is normal. . On occasion, you may experience a mild rash up to 24 hours after the test. This is not dangerous. If this occurs, you can take Benadryl 25 mg and increase your fluid intake. . If you experience trouble breathing, this can be serious. If it is severe call 911 IMMEDIATELY. If it is mild, please call our office. . If you take any of these medications: Glipizide/Metformin, Avandament, Glucavance, please do not take 48 hours after completing test unless otherwise instructed.  Once we have confirmed authorization from your insurance company, we will call you to set up a date and time for your test. Based on how quickly your insurance processes prior authorizations requests, please allow up to 4 weeks to be contacted for scheduling your Cardiac CT appointment. Be advised that routine Cardiac CT appointments could be scheduled as many as 8 weeks after your provider  has ordered it.  For non-scheduling related questions, please contact the cardiac imaging nurse navigator should you have any questions/concerns: Rockwell [place], Cardiac Imaging Nurse Navigator Larey Brick, Cardiac Imaging Nurse Navigator [facility] Direct Office Dial: [phone]   For scheduling needs, including cancellations and rescheduling, please call [place],  [phone].

## 2020-09-05 NOTE — Progress Notes (Signed)
Cardiology Office Note:    Date:  09/05/2020   ID:  Everitt Wenner, DOB 27-Jun-1941, MRN [id number]  PCP:  Mliss Sax, MD   Cardiologist:  No primary care provider on file.  Electrophysiologist:  Lanier Prude, MD   Referring MD: Mliss Sax   Chief Complaint/Reason for Referral: Dizziness, CP  History of Present Illness:    Fernando Saunders is a 80 y.o. male with a history of HLD and GERD who presents for further evaluation of presyncope and bradycardia, as well as recent assessment of possible cardiac chest pain.   His daughter is present and serves as his interpreter per his preference.  He went to the [place] over the holidays and had a good trip.  He continues to have chest discomfort.  He notices this mostly when he is squatting down working with his hands.  He has no obstructive features on his echocardiogram.  We reviewed his equivocal stress ECG from our last visit and that if chest pain continued we would consider imaging stress.  Discussed coronary CTA today.  Given his baseline bradycardia he would do well with this modality.  Continues to have dizziness and had one presyncopal episode since our last visit.  He has been seen by electrophysiology who did not note significant conduction system disease and noted chronic sinus bradycardia.  Dizziness may be cardiac in origin but would consider neurology evaluation as well.  I have offered EP consultation but the patient defers until after neurology work-up is unrevealing.  No clear signs for pacemaker implantation as of yet but would plan to review this with EP.   Past Medical History:  Diagnosis Date   Arthritis    Diverticulosis    GERD (gastroesophageal reflux disease)    Hyperlipidemia     Past Surgical History:  Procedure Laterality Date   COLONOSCOPY  11/04/2011   Dr. Stan Head   EYE SURGERY      Current Medications: Current Meds  Medication Sig   gabapentin  (NEURONTIN) 100 MG capsule Take 1-2 capsules (100-200 mg total) by mouth at bedtime.   lisinopril (ZESTRIL) 10 MG tablet Take 1 tablet by mouth once daily   Multiple Vitamin (MULTIVITAMIN) tablet Take 1 tablet by mouth daily.   MYRBETRIQ 50 MG TB24 tablet Take 1 tablet by mouth daily.   omega-3 acid ethyl esters (LOVAZA) 1 g capsule Take by mouth as needed.   omeprazole (PRILOSEC) 40 MG capsule Take 1 capsule (40 mg total) by mouth daily.   pravastatin (PRAVACHOL) 40 MG tablet Take 1 tablet (40 mg total) by mouth every evening.   tamsulosin (FLOMAX) 0.4 MG CAPS capsule Take 0.4 mg by mouth daily.    triamcinolone cream (KENALOG) 0.1 % Apply 1 application topically daily as needed.     Allergies:   Patient has no known allergies.   Social History   Tobacco Use   Smoking status: Never Smoker   Smokeless tobacco: Never Used  Vaping Use   Vaping Use: Never used  Substance Use Topics   Alcohol use: Yes    Alcohol/week: 0.0 standard drinks    Comment: occasional   Drug use: No     Family History: The patient's family history includes Cancer in his sister; Diabetes in his mother. There is no history of Colon cancer, Stomach cancer, or Heart attack.  ROS:   Please see the history of present illness.    All other systems reviewed and are negative.  EKGs/Labs/Other Studies Reviewed:  The following studies were reviewed today:  EKG: Sinus bradycardia, PAC  Recent Labs: 12/07/2019: ALT 19 01/28/2020: BUN 19; Creatinine, Ser 1.17; Hemoglobin 13.4; Platelets 225; Potassium 3.9; Sodium 137  Recent Lipid Panel    Component Value Date/Time   CHOL 196 12/07/2019 0829   CHOL 219 (H) 01/11/2017 1450   TRIG 208.0 (H) 12/07/2019 0829   HDL 35.50 (L) 12/07/2019 0829   HDL 37 (L) 01/11/2017 1450   CHOLHDL 6 12/07/2019 0829   VLDL 41.6 (H) 12/07/2019 0829   LDLCALC 129 (H) 06/09/2018 0841   LDLCALC 144 (H) 01/11/2017 1450   LDLDIRECT 122.0 12/07/2019 0829    Physical  Exam:    VS:  BP 136/68 (BP Location: Left Arm, Patient Position: Sitting, Cuff Size: Normal)    Pulse (!) 51    Ht 6' (1.829 m)    Wt 188 lb (85.3 kg)    BMI 25.50 kg/m     Wt Readings from Last 5 Encounters:  09/05/20 188 lb (85.3 kg)  05/15/20 188 lb 9.6 oz (85.5 kg)  05/05/20 186 lb 14.4 oz (84.8 kg)  04/03/20 185 lb 9.6 oz (84.2 kg)  02/23/20 180 lb (81.6 kg)    Constitutional: No acute distress Eyes: sclera non-icteric, normal conjunctiva and lids ENMT: normal dentition, moist mucous membranes Cardiovascular: regular rhythm, bradycardic rate, no murmurs. S1 and S2 normal. Radial pulses normal bilaterally. No jugular venous distention.  Respiratory: clear to auscultation bilaterally GI : normal bowel sounds, soft and nontender. No distention.   MSK: extremities warm, well perfused. No edema.  NEURO: grossly nonfocal exam, moves all extremities. PSYCH: alert and oriented x 3, normal mood and affect.   ASSESSMENT:    1. Precordial pain   2. Mixed hyperlipidemia   3. Bradycardia   4. Dizziness    PLAN:    Precordial pain - Plan: EKG 12-Lead, Basic metabolic panel, CT CORONARY MORPH W/CTA COR W/SCORE W/CA W/CM &/OR WO/CM, CT CORONARY FRACTIONAL FLOW RESERVE DATA PREP, CT CORONARY FRACTIONAL FLOW RESERVE FLUID ANALYSIS  He continues to have left-sided chest pressure which may represent possible cardiac chest pain.  We discussed this at our last visit and determined that we would perform imaging stress if chest pain continued.  Will proceed with coronary CTA.  Mixed hyperlipidemia-patient is on pravastatin with suboptimal control of lipids, most recent LDL from June 2021 is 122, triglycerides 208.  Will make adjustments to cholesterol-lowering therapy at next follow-up with results of CCTA and hand.  He will likely need high intensity statin therapy due to lipids alone.  Bradycardia Dizziness -Continues to have sinus bradycardia and dizziness with some presyncope.  I have  offered EP follow-up but they defer at this time given no strong evidence of conduction system disease.  They will complete a neurology evaluation and will request this through their primary care doctor, if unrevealing, we will plan for EP follow-up for further evaluation of dizziness in the setting of sinus bradycardia.  Avoid AV nodal blocking agents.  Total time of encounter: 30 minutes total time of encounter, including 20 minutes spent in face-to-face patient care on the date of this encounter. This time includes coordination of care and counseling regarding above mentioned problem list. Remainder of non-face-to-face time involved reviewing chart documents/testing relevant to the patient encounter and documentation in the medical record. I have independently reviewed documentation from referring provider.   Weston BrassGayatri Jari Carollo, MD, [facility] [place]   [facility]    Medication Adjustments/Labs and Tests Ordered: Current  medicines are reviewed at length with the patient today.  Concerns regarding medicines are outlined above.   Orders Placed This Encounter  Procedures   CT CORONARY MORPH W/CTA COR W/SCORE W/CA W/CM &/OR WO/CM   CT CORONARY FRACTIONAL FLOW RESERVE DATA PREP   CT CORONARY FRACTIONAL FLOW RESERVE FLUID ANALYSIS   Basic metabolic panel   EKG 12-Lead    Shared Decision Making/Informed Consent:       No orders of the defined types were placed in this encounter.   Patient Instructions  Medication Instructions:  No Changes In Medications at this time.  *If you need a refill on your cardiac medications before your next appointment, please call your pharmacy*  Lab Work: BMET- 1 WEEK PRIOR TO CCTA  If you have labs (blood work) drawn today and your tests are completely normal, you will receive your results only by:  MyChart Message (if you have MyChart) OR  A paper copy in the mail If you have any lab test that is abnormal or we need to change your treatment, we  will call you to review the results.  Testing/Procedures: Your physician has requested that you have cardiac CT. Cardiac computed tomography (CT) is a painless test that uses an x-ray machine to take clear, detailed pictures of your heart. For further information please visit [email or web address] Please follow instruction sheet as given.  Follow-Up: At [facility], you and your health needs are our priority.  As part of our continuing mission to provide you with exceptional heart care, we have created designated Provider Care Teams.  These Care Teams include your primary Cardiologist (physician) and Advanced Practice Providers (APPs -  Physician Assistants and Nurse Practitioners) who all work together to provide you with the care you need, when you need it.  Your next appointment:   3 month(s)  The format for your next appointment:   In Person  Provider:   Weston Brass, MD  Other Instructions Your cardiac CT will be scheduled at one of the below locations:   [facility] [address] [place], [place] [address] [phone]  If scheduled at [facility], please arrive at the [facility] main entrance (entrance A) of [facility] 30 minutes prior to test start time. Proceed to the [facility] Radiology Department (first floor) to check-in and test prep.  Please follow these instructions carefully (unless otherwise directed):  Hold all erectile dysfunction medications at least 3 days (72 hrs) prior to test.  On the Night Before the Test:  Be sure to Drink plenty of water.  Do not consume any caffeinated/decaffeinated beverages or chocolate 12 hours prior to your test.  Do not take any antihistamines 12 hours prior to your test.  On the Day of the Test:  Drink plenty of water until 1 hour prior to the test.  Do not eat any food 4 hours prior to the test.  You may take your regular medications prior to the test.   HOLD  Furosemide/Hydrochlorothiazide morning of the test.  After the Test:  Drink plenty of water.  After receiving IV contrast, you may experience a mild flushed feeling. This is normal.  On occasion, you may experience a mild rash up to 24 hours after the test. This is not dangerous. If this occurs, you can take Benadryl 25 mg and increase your fluid intake.  If you experience trouble breathing, this can be serious. If it is severe call 911 IMMEDIATELY. If it is mild, please  call our office.  If you take any of these medications: Glipizide/Metformin, Avandament, Glucavance, please do not take 48 hours after completing test unless otherwise instructed.  Once we have confirmed authorization from your insurance company, we will call you to set up a date and time for your test. Based on how quickly your insurance processes prior authorizations requests, please allow up to 4 weeks to be contacted for scheduling your Cardiac CT appointment. Be advised that routine Cardiac CT appointments could be scheduled as many as 8 weeks after your provider has ordered it.  For non-scheduling related questions, please contact the cardiac imaging nurse navigator should you have any questions/concerns: Rockwell Alexandria, Cardiac Imaging Nurse Navigator Larey Brick, Cardiac Imaging Nurse Navigator [facility] Direct Office Dial: [phone]   For scheduling needs, including cancellations and rescheduling, please call [place], [phone].

## 2020-09-11 ENCOUNTER — Telehealth: Payer: Self-pay | Admitting: Family Medicine

## 2020-09-11 NOTE — Telephone Encounter (Signed)
Patient wondering if he can do a transfer of care from Dr. Kremer to Dr. Jones. Just let me know, thank you. °

## 2020-09-12 NOTE — Telephone Encounter (Signed)
Okay with me 

## 2020-09-29 DIAGNOSIS — R072 Precordial pain: Secondary | ICD-10-CM | POA: Diagnosis not present

## 2020-09-29 LAB — BASIC METABOLIC PANEL
BUN/Creatinine Ratio: 13 (ref 10–24)
BUN: 15 mg/dL (ref 8–27)
CO2: 21 mmol/L (ref 20–29)
Calcium: 8.9 mg/dL (ref 8.6–10.2)
Chloride: 104 mmol/L (ref 96–106)
Creatinine, Ser: 1.17 mg/dL (ref 0.76–1.27)
Glucose: 90 mg/dL (ref 65–99)
Potassium: 4.6 mmol/L (ref 3.5–5.2)
Sodium: 140 mmol/L (ref 134–144)
eGFR: 64 mL/min/{1.73_m2} (ref >59)

## 2020-10-04 ENCOUNTER — Telehealth (HOSPITAL_COMMUNITY): Payer: Self-pay | Admitting: Emergency Medicine

## 2020-10-04 NOTE — Telephone Encounter (Signed)
Reaching out to patient to offer assistance regarding upcoming cardiac imaging study; pt verbalizes understanding of appt date/time, parking situation and where to check in, pre-test NPO status and medications ordered, and verified current allergies; name and call back number provided for further questions should they arise Rockwell Alexandria RN Navigator Cardiac Imaging [facility] [phone] office [phone] cell   Spoke with the daughter who helps translate for the patient. Pt to take daily meds per usual (resting HR 50s)  [place] Dec

## 2020-10-06 ENCOUNTER — Ambulatory Visit (HOSPITAL_COMMUNITY)
Admission: RE | Admit: 2020-10-06 | Discharge: 2020-10-06 | Disposition: A | Discharge status: Home / Self Care | Payer: Medicare Other | Source: Ambulatory Visit | Attending: Internal Medicine | Admitting: Internal Medicine

## 2020-10-06 ENCOUNTER — Other Ambulatory Visit: Payer: Self-pay

## 2020-10-06 DIAGNOSIS — I700 Atherosclerosis of aorta: Secondary | ICD-10-CM

## 2020-10-06 DIAGNOSIS — R072 Precordial pain: Secondary | ICD-10-CM

## 2020-10-06 DIAGNOSIS — R931 Abnormal findings on diagnostic imaging of heart and coronary circulation: Secondary | ICD-10-CM | POA: Insufficient documentation

## 2020-10-06 DIAGNOSIS — I2510 Atherosclerotic heart disease of native coronary artery without angina pectoris: Secondary | ICD-10-CM | POA: Insufficient documentation

## 2020-10-06 MED ORDER — NITROGLYCERIN 0.4 MG SL SUBL
SUBLINGUAL_TABLET | SUBLINGUAL | Status: AC
  Filled 2020-10-06: qty 2

## 2020-10-06 MED ORDER — IOHEXOL 350 MG/ML SOLN
90.0000 mL | Freq: Once | INTRAVENOUS | Status: AC | PRN
  Administered 2020-10-06: 90 mL via INTRAVENOUS

## 2020-10-06 MED ORDER — NITROGLYCERIN 0.4 MG SL SUBL
0.8000 mg | SUBLINGUAL_TABLET | Freq: Once | SUBLINGUAL | Status: AC
  Administered 2020-10-06: 0.8 mg via SUBLINGUAL

## 2020-10-08 DIAGNOSIS — R931 Abnormal findings on diagnostic imaging of heart and coronary circulation: Secondary | ICD-10-CM

## 2020-10-08 DIAGNOSIS — I700 Atherosclerosis of aorta: Secondary | ICD-10-CM | POA: Diagnosis not present

## 2020-10-08 DIAGNOSIS — E782 Mixed hyperlipidemia: Secondary | ICD-10-CM

## 2020-10-08 DIAGNOSIS — Z79899 Other long term (current) drug therapy: Secondary | ICD-10-CM

## 2020-10-09 DIAGNOSIS — R3915 Urgency of urination: Secondary | ICD-10-CM | POA: Diagnosis not present

## 2020-10-09 MED ORDER — ATORVASTATIN CALCIUM 40 MG PO TABS: 40.0000 mg | ORAL_TABLET | Freq: Every day | ORAL | 3 refills | Status: DC

## 2020-10-09 MED ORDER — ASPIRIN EC 81 MG PO TBEC
81.0000 mg | DELAYED_RELEASE_TABLET | Freq: Every day | ORAL | 3 refills | Status: AC
Start: 2020-10-09 — End: ?

## 2020-10-09 NOTE — Telephone Encounter (Signed)
Moderate blockages with no significant stenosis. Chest pain is probably not coming from blockages in the arteries since they are not severe. We will treat these blockages with medicines. We will discuss further at next follow up, if questions please arrange follow up sooner.   Start ASA 81 mg daily and change pravastatin to atorvastatin 40 mg daily. Recheck lipids and CMP in 3 mo.   Pts daughter advised and verbalized understanding.    They will keep the 12/26/20 appt.

## 2020-11-15 ENCOUNTER — Encounter: Payer: Medicare Other | Admitting: Internal Medicine

## 2020-12-26 ENCOUNTER — Ambulatory Visit: Payer: Medicare Other | Admitting: Internal Medicine

## 2021-02-13 ENCOUNTER — Ambulatory Visit (INDEPENDENT_AMBULATORY_CARE_PROVIDER_SITE_OTHER): Payer: Medicare Other | Admitting: Internal Medicine

## 2021-02-13 ENCOUNTER — Encounter: Payer: Self-pay | Admitting: Internal Medicine

## 2021-02-13 ENCOUNTER — Other Ambulatory Visit (INDEPENDENT_AMBULATORY_CARE_PROVIDER_SITE_OTHER): Payer: Medicare Other

## 2021-02-13 ENCOUNTER — Other Ambulatory Visit: Payer: Self-pay

## 2021-02-13 VITALS — BP 138/82 | HR 42 | Temp 98.3°F | Resp 16 | Ht 72.0 in | Wt 183.0 lb

## 2021-02-13 DIAGNOSIS — M8949 Other hypertrophic osteoarthropathy, multiple sites: Secondary | ICD-10-CM

## 2021-02-13 DIAGNOSIS — E785 Hyperlipidemia, unspecified: Secondary | ICD-10-CM | POA: Diagnosis not present

## 2021-02-13 DIAGNOSIS — M545 Low back pain, unspecified: Secondary | ICD-10-CM | POA: Diagnosis not present

## 2021-02-13 DIAGNOSIS — I10 Essential (primary) hypertension: Secondary | ICD-10-CM

## 2021-02-13 DIAGNOSIS — M159 Polyosteoarthritis, unspecified: Secondary | ICD-10-CM

## 2021-02-13 DIAGNOSIS — R001 Bradycardia, unspecified: Secondary | ICD-10-CM

## 2021-02-13 DIAGNOSIS — Z23 Encounter for immunization: Secondary | ICD-10-CM | POA: Diagnosis not present

## 2021-02-13 DIAGNOSIS — N400 Enlarged prostate without lower urinary tract symptoms: Secondary | ICD-10-CM

## 2021-02-13 DIAGNOSIS — M150 Primary generalized (osteo)arthritis: Secondary | ICD-10-CM

## 2021-02-13 DIAGNOSIS — Z0001 Encounter for general adult medical examination with abnormal findings: Secondary | ICD-10-CM

## 2021-02-13 LAB — CBC WITH DIFFERENTIAL/PLATELET
Basophils Absolute: 0 10*3/uL (ref 0.0–0.1)
Basophils Relative: 0.6 % (ref 0.0–3.0)
Eosinophils Absolute: 0.1 10*3/uL (ref 0.0–0.7)
Eosinophils Relative: 1.9 % (ref 0.0–5.0)
HCT: 39.9 % (ref 39.0–52.0)
Hemoglobin: 13 g/dL (ref 13.0–17.0)
Lymphocytes Relative: 35.7 % (ref 12.0–46.0)
Lymphs Abs: 2.6 10*3/uL (ref 0.7–4.0)
MCHC: 32.6 g/dL (ref 30.0–36.0)
MCV: 96.2 fl (ref 78.0–100.0)
Monocytes Absolute: 0.7 10*3/uL (ref 0.1–1.0)
Monocytes Relative: 10 % (ref 3.0–12.0)
Neutro Abs: 3.8 10*3/uL (ref 1.4–7.7)
Neutrophils Relative %: 51.8 % (ref 43.0–77.0)
Platelets: 202 10*3/uL (ref 150.0–400.0)
RBC: 4.15 Mil/uL — ABNORMAL LOW (ref 4.22–5.81)
RDW: 12.6 % (ref 11.5–15.5)
WBC: 7.4 10*3/uL (ref 4.0–10.5)

## 2021-02-13 LAB — LIPID PANEL
Cholesterol: 103 mg/dL (ref 0–200)
HDL: 36.7 mg/dL — ABNORMAL LOW (ref >39.00)
LDL Cholesterol: 45 mg/dL (ref 0–99)
NonHDL: 66.23
Total CHOL/HDL Ratio: 3
Triglycerides: 104 mg/dL (ref 0.0–149.0)
VLDL: 20.8 mg/dL (ref 0.0–40.0)

## 2021-02-13 LAB — BASIC METABOLIC PANEL
BUN: 18 mg/dL (ref 6–23)
CO2: 28 mEq/L (ref 19–32)
Calcium: 9.3 mg/dL (ref 8.4–10.5)
Chloride: 103 mEq/L (ref 96–112)
Creatinine, Ser: 1.11 mg/dL (ref 0.40–1.50)
GFR: 63.95 mL/min (ref >60.00)
Glucose, Bld: 82 mg/dL (ref 70–99)
Potassium: 4.2 mEq/L (ref 3.5–5.1)
Sodium: 137 mEq/L (ref 135–145)

## 2021-02-13 LAB — HEPATIC FUNCTION PANEL
ALT: 23 U/L (ref 0–53)
AST: 24 U/L (ref 0–37)
Albumin: 4.3 g/dL (ref 3.5–5.2)
Alkaline Phosphatase: 91 U/L (ref 39–117)
Bilirubin, Direct: 0.1 mg/dL (ref 0.0–0.3)
Total Bilirubin: 0.6 mg/dL (ref 0.2–1.2)
Total Protein: 7.3 g/dL (ref 6.0–8.3)

## 2021-02-13 MED ORDER — CELECOXIB 100 MG PO CAPS: 100.0000 mg | ORAL_CAPSULE | Freq: Two times a day (BID) | ORAL | 1 refills | Status: DC

## 2021-02-13 NOTE — Progress Notes (Signed)
Subjective:  Patient ID: Fernando Saunders, male    DOB: 03/23/1941  Age: 80 y.o. MRN: [id number]  CC: Annual Exam, Hypertension, Hyperlipidemia, Back Pain, and Osteoarthritis  This visit occurred during the SARS-CoV-2 public health emergency.  Safety protocols were in place, including screening questions prior to the visit, additional usage of staff PPE, and extensive cleaning of exam room while observing appropriate contact time as indicated for disinfecting solutions.    [facility] for a CPX, f/up, and to establish.  He is with his daughter who translates for him.  He has had a 1 week history of right lower back pain without any history of trauma.  He says the pain does not radiate.  He denies lower extremity paresthesias.  He also complains of chronic pain in his large joints.  He is not getting much symptom relief with Tylenol.    Outpatient Medications Prior to Visit  Medication Sig Dispense Refill   aspirin EC 81 MG tablet Take 1 tablet (81 mg total) by mouth daily. Swallow whole. 100 tablet 3   atorvastatin (LIPITOR) 40 MG tablet Take 1 tablet (40 mg total) by mouth daily. 90 tablet 3   gabapentin (NEURONTIN) 100 MG capsule Take 1-2 capsules (100-200 mg total) by mouth at bedtime. 60 capsule 2   lisinopril (ZESTRIL) 10 MG tablet Take 1 tablet by mouth once daily 90 tablet 0   Multiple Vitamin (MULTIVITAMIN) tablet Take 1 tablet by mouth daily.     MYRBETRIQ 50 MG TB24 tablet Take 1 tablet by mouth daily.     omega-3 acid ethyl esters (LOVAZA) 1 g capsule Take by mouth as needed.     omeprazole (PRILOSEC) 40 MG capsule Take 1 capsule (40 mg total) by mouth daily. 30 capsule 3   tamsulosin (FLOMAX) 0.4 MG CAPS capsule Take 0.4 mg by mouth daily.      triamcinolone cream (KENALOG) 0.1 % Apply 1 application topically daily as needed. 453 g 2   No facility-administered medications prior to visit.    ROS Review of Systems  Constitutional:  Negative for chills,  diaphoresis, fatigue and fever.  HENT:  Negative for trouble swallowing and voice change.   Eyes: Negative.   Respiratory:  Negative for cough, chest tightness, shortness of breath and wheezing.   Cardiovascular:  Negative for chest pain, palpitations and leg swelling.  Gastrointestinal:  Negative for abdominal pain, blood in stool, constipation, diarrhea, nausea and vomiting.  Endocrine: Negative.   Genitourinary: Negative.  Negative for difficulty urinating.  Musculoskeletal:  Positive for arthralgias and back pain. Negative for myalgias.  Skin: Negative.  Negative for color change and rash.  Neurological:  Negative for dizziness, weakness, light-headedness, numbness and headaches.  Hematological:  Negative for adenopathy. Does not bruise/bleed easily.  Psychiatric/Behavioral: Negative.     Objective:  BP 138/82 (BP Location: Left Arm, Patient Position: Sitting, Cuff Size: Large)   Pulse (!) 42   Temp 98.3 F (36.8 C) (Oral)   Resp 16   Ht 6' (1.829 m)   Wt 183 lb (83 kg)   SpO2 97%   BMI 24.82 kg/m   BP Readings from Last 3 Encounters:  02/13/21 138/82  10/06/20 124/73  09/05/20 136/68    Wt Readings from Last 3 Encounters:  02/13/21 183 lb (83 kg)  09/05/20 188 lb (85.3 kg)  05/15/20 188 lb 9.6 oz (85.5 kg)    Physical Exam Vitals reviewed.  Constitutional:      Appearance: Normal appearance.  HENT:  Mouth/Throat:     Mouth: Mucous membranes are moist.  Eyes:     General: No scleral icterus.    Conjunctiva/sclera: Conjunctivae normal.  Cardiovascular:     Rate and Rhythm: Regular rhythm. Bradycardia present.     Heart sounds: No murmur heard.   No gallop.  Pulmonary:     Effort: Pulmonary effort is normal.     Breath sounds: No stridor. No wheezing, rhonchi or rales.  Abdominal:     General: Abdomen is flat.     Palpations: There is no mass.     Tenderness: There is no abdominal tenderness. There is no guarding.     Hernia: No hernia is present.   Musculoskeletal:        General: Deformity (DJD in large joints) present. No swelling or tenderness.     Cervical back: Normal and neck supple.     Thoracic back: Normal.     Lumbar back: Normal. No signs of trauma, spasms, tenderness or bony tenderness. Normal range of motion. Negative right straight leg raise test and negative left straight leg raise test.     Right lower leg: No edema.     Left lower leg: No edema.  Lymphadenopathy:     Cervical: No cervical adenopathy.  Skin:    General: Skin is warm and dry.  Neurological:     General: No focal deficit present.     Mental Status: He is alert.    Lab Results  Component Value Date   WBC 7.4 02/13/2021   HGB 13.0 02/13/2021   HCT 39.9 02/13/2021   PLT 202.0 02/13/2021   GLUCOSE 82 02/13/2021   CHOL 103 02/13/2021   TRIG 104.0 02/13/2021   HDL 36.70 (L) 02/13/2021   LDLDIRECT 122.0 12/07/2019   LDLCALC 45 02/13/2021   ALT 23 02/13/2021   AST 24 02/13/2021   NA 137 02/13/2021   K 4.2 02/13/2021   CL 103 02/13/2021   CREATININE 1.11 02/13/2021   BUN 18 02/13/2021   CO2 28 02/13/2021   TSH 1.89 02/13/2021   PSA 1.90 02/13/2021    CT CORONARY MORPH W/CTA COR W/SCORE W/CA W/CM &/OR WO/CM  Addendum Date: 10/06/2020   ADDENDUM REPORT: 10/06/2020 11:35 CLINICAL DATA:  80 Year old Hispanic Male EXAM: Cardiac/Coronary  CTA TECHNIQUE: The patient was scanned on a [employer]. FINDINGS: A 100 kV prospective scan was triggered in the descending thoracic aorta at 111 HU's. Axial non-contrast 3 mm slices were carried out through the heart. The data set was analyzed on a dedicated work station and scored using the Agatson method. Gantry rotation speed was 250 msecs and collimation was .6 mm. No beta blockade and 0.8 mg of sl NTG was given. The 3D data set was reconstructed in 5% intervals of the 67-82 % of the R-R cycle. Diastolic phases were analyzed on a dedicated work station using MPR, MIP and VRT modes. The patient  received 90 cc of contrast. Aorta: Normal size. Ascending aortic atherosclerosis noted. No dissection. Aortic Valve:  Tri-leaflet.  Minimal annular calcification. Coronary Arteries:  Normal coronary origin.  Right dominance. Coronary Calcium Score: Left main: 44 Left anterior descending artery: 130 Left circumflex artery: 0 Right coronary artery: 0 Total: 174 Percentile: 58th for age, sex, and race matched control. RCA is a large dominant artery that gives rise to PDA and PLA. There is no plaque. Left main is a large artery that gives rise to LAD and LCX arteries. There is a distal calcific  plaque < 10%. LAD is a large vessel that gives rise to one large D1 Branch and a smaller D2 vessel. There is a mild non obstructive (25-49%) calcific plaque in the ostial vessel. There is a mild non obstructive (25-49%) calcific plaque in the proximal vessel. There is a moderate stenosis (50-70%) calcific plaque in the mid vessel. LCX is a non-dominant artery that gives rise to two OM vessels. There is no plaque. Other findings: Normal pulmonary vein drainage into the left atrium. Normal left atrial appendage without a thrombus. Normal size of the pulmonary artery. Cannot rule out small patent foramen ovale. Extra-cardiac findings: See attached radiology report for non-cardiac structures. IMPRESSION: 1. Coronary calcium score of 174. This was 58th percentile for age, sex, and race matched control. 2. Normal coronary origin with Right dominance. 3. CAD-RADS 3. Moderate stenosis in LAD system. Consider symptom-guided anti-ischemic pharmacotherapy as well as risk factor modification per guideline directed care. Additional analysis with CT FFR will be submitted. 4. Ascending aortic atherosclerosis noted. 5. Cannot rule out small patent foramen ovale. RECOMMENDATIONS: Coronary artery calcium (CAC) score is a strong predictor of incident coronary heart disease (CHD) and provides predictive information beyond traditional risk factors.  CAC scoring is reasonable to use in the decision to withhold, postpone, or initiate statin therapy in intermediate-risk or selected borderline-risk asymptomatic adults (age 97-75 years and LDL-C ?70 to <190 mg/dL) who do not have diabetes or established atherosclerotic cardiovascular disease (ASCVD).* In intermediate-risk (10-year ASCVD risk ?7.5% to <20%) adults or selected borderline-risk (10-year ASCVD risk ?5% to <7.5%) adults in whom a CAC score is measured for the purpose of making a treatment decision the following recommendations have been made: If CAC = 0, it is reasonable to withhold statin therapy and reassess in 5 to 10 years, as long as higher risk conditions are absent (diabetes mellitus, family history of premature CHD in first degree relatives (males <55 years; females <65 years), cigarette smoking, LDL ?190 mg/dL or other independent risk factors). If CAC is 1 to 99, it is reasonable to initiate statin therapy for patients ?78 years of age. If CAC is ?100 or ?75th percentile, it is reasonable to initiate statin therapy at any age. Cardiology referral should be considered for patients with CAC scores ?400 or ?75th percentile. *2018 AHA/ACC/AACVPR/AAPA/ABC/ACPM/ADA/AGS/APhA/ASPC/NLA/PCNA Guideline on the Management of Blood Cholesterol: A Report of the American College of Cardiology/American Heart Association Task Force on Clinical Practice Guidelines. J Am Coll Cardiol. 2019;73(24):3168-3209. Riley Lam, MD Electronically Signed   By: Riley Lam MD   On: 10/06/2020 11:35   Result Date: 10/06/2020 EXAM: OVER-READ INTERPRETATION  CT CHEST The following report is an over-read performed by radiologist Dr. Trudie Reed of [facility] on 10/06/2020. This over-read does not include interpretation of cardiac or coronary anatomy or pathology. The coronary calcium score/coronary CTA interpretation by the cardiologist is attached. COMPARISON:  None. FINDINGS: Aortic  atherosclerosis. Within the visualized portions of the thorax there are no suspicious appearing pulmonary nodules or masses, there is no acute consolidative airspace disease, no pleural effusions, no pneumothorax and no lymphadenopathy. Visualized portions of the upper abdomen are unremarkable. There are no aggressive appearing lytic or blastic lesions noted in the visualized portions of the skeleton. IMPRESSION: 1.  Aortic Atherosclerosis (ICD10-I70.0). Electronically Signed: By: Trudie Reed M.D. On: 10/06/2020 08:18   CT CORONARY FRACTIONAL FLOW RESERVE DATA PREP  Result Date: 10/08/2020 EXAM: CT-FFR ANALYSIS CLINICAL DATA:  Possibly obstructive coronary lesion FINDINGS: CT-FFR analysis was  performed on the original cardiac CT angiogram dataset. Diagrammatic representation of the CT-FFR analysis is provided in a separate PDF document in PACS. This dictation was created using the PDF document and an interactive 3D model of the results. 3D model is not available in the EMR/PACS. Normal FFR range is >0.80. 1. Left Main: No significant functional stenosis, CT-FFR 0.98 distal vessel 2. LAD: Possibly significant functional stenosis in the distal LAD CT-FFR 0.83. 3. LCX: No significant functional stenosis, CT-FFR 0.92 distal vessel. 4. RCA: No significant functional stenosis, CT-FFR 0.94 distal vessel. IMPRESSION: 1. CT FFR analysis shows borderline evidence of significant functional stenosis in the distal LAD. Riley Lam MD Electronically Signed   By: Riley Lam MD   On: 10/08/2020 15:09    Assessment & Plan:   Jamauri was seen today for annual exam, hypertension, hyperlipidemia, back pain and osteoarthritis.  Diagnoses and all orders for this visit:  Primary osteoarthritis involving multiple joints -     celecoxib (CELEBREX) 100 MG capsule; Take 1 capsule (100 mg total) by mouth 2 (two) times daily.  Acute right-sided low back pain without sciatica -     celecoxib (CELEBREX)  100 MG capsule; Take 1 capsule (100 mg total) by mouth 2 (two) times daily.  Bradycardia -     TSH; Future  Hyperlipidemia with target LDL less than 100- LDL goal achieved. Doing well on the statin  -     Lipid panel; Future -     TSH; Future -     Hepatic function panel; Future  Essential hypertension- His BP is adequately well controlled. -     CBC with Differential/Platelet; Future -     Basic metabolic panel; Future -     TSH; Future -     Urinalysis, Routine w reflex microscopic; Future  Benign prostatic hyperplasia without lower urinary tract symptoms- His PSA is normal. -     PSA; Future -     Urinalysis, Routine w reflex microscopic; Future  Need for vaccination -     Pneumococcal polysaccharide vaccine 23-valent greater than or equal to 2yo subcutaneous/IM  Encounter for general adult medical examination with abnormal findings-Exam completed, labs reviewed, vaccines reviewed and updated, no cancer screenings are indicated, patient education was given.  I am having [facility] start on celecoxib. I am also having him maintain his multivitamin, Myrbetriq, tamsulosin, omeprazole, omega-3 acid ethyl esters, triamcinolone cream, gabapentin, lisinopril, aspirin EC, and atorvastatin.  Meds ordered this encounter  Medications   celecoxib (CELEBREX) 100 MG capsule    Sig: Take 1 capsule (100 mg total) by mouth 2 (two) times daily.    Dispense:  180 capsule    Refill:  1      Follow-up: Return in about 3 months (around 05/16/2021).  Sanda Linger, MD

## 2021-02-13 NOTE — Patient Instructions (Signed)
Health Maintenance, Male Adopting a healthy lifestyle and getting preventive care are important in promoting health and wellness. Ask your health care provider about: The right schedule for you to have regular tests and exams. Things you can do on your own to prevent diseases and keep yourself healthy. What should I know about diet, weight, and exercise? Eat a healthy diet  Eat a diet that includes plenty of vegetables, fruits, low-fat dairy products, and lean protein. Do not eat a lot of foods that are high in solid fats, added sugars, or sodium.  Maintain a healthy weight Body mass index (BMI) is a measurement that can be used to identify possible weight problems. It estimates body fat based on height and weight. Your health care provider can help determine your BMI and help you achieve or maintain ahealthy weight. Get regular exercise Get regular exercise. This is one of the most important things you can do for your health. Most adults should: Exercise for at least 150 minutes each week. The exercise should increase your heart rate and make you sweat (moderate-intensity exercise). Do strengthening exercises at least twice a week. This is in addition to the moderate-intensity exercise. Spend less time sitting. Even light physical activity can be beneficial. Watch cholesterol and blood lipids Have your blood tested for lipids and cholesterol at 78 years of age, then havethis test every 5 years. You may need to have your cholesterol levels checked more often if: Your lipid or cholesterol levels are high. You are older than 78 years of age. You are at high risk for heart disease. What should I know about cancer screening? Many types of cancers can be detected early and may often be prevented. Depending on your health history and family history, you may need to have cancer screening at various ages. This may include screening for: Colorectal cancer. Prostate cancer. Skin cancer. Lung  cancer. What should I know about heart disease, diabetes, and high blood pressure? Blood pressure and heart disease High blood pressure causes heart disease and increases the risk of stroke. This is more likely to develop in people who have high blood pressure readings, are of African descent, or are overweight. Talk with your health care provider about your target blood pressure readings. Have your blood pressure checked: Every 3-5 years if you are 18-39 years of age. Every year if you are 40 years old or older. If you are between the ages of 65 and 75 and are a current or former smoker, ask your health care provider if you should have a one-time screening for abdominal aortic aneurysm (AAA). Diabetes Have regular diabetes screenings. This checks your fasting blood sugar level. Have the screening done: Once every three years after age 45 if you are at a normal weight and have a low risk for diabetes. More often and at a younger age if you are overweight or have a high risk for diabetes. What should I know about preventing infection? Hepatitis B If you have a higher risk for hepatitis B, you should be screened for this virus. Talk with your health care provider to find out if you are at risk forhepatitis B infection. Hepatitis C Blood testing is recommended for: Everyone born from 1945 through 1965. Anyone with known risk factors for hepatitis C. Sexually transmitted infections (STIs) You should be screened each year for STIs, including gonorrhea and chlamydia, if: You are sexually active and are younger than 78 years of age. You are older than 78 years of age   and your health care provider tells you that you are at risk for this type of infection. Your sexual activity has changed since you were last screened, and you are at increased risk for chlamydia or gonorrhea. Ask your health care provider if you are at risk. Ask your health care provider about whether you are at high risk for HIV.  Your health care provider may recommend a prescription medicine to help prevent HIV infection. If you choose to take medicine to prevent HIV, you should first get tested for HIV. You should then be tested every 3 months for as long as you are taking the medicine. Follow these instructions at home: Lifestyle Do not use any products that contain nicotine or tobacco, such as cigarettes, e-cigarettes, and chewing tobacco. If you need help quitting, ask your health care provider. Do not use street drugs. Do not share needles. Ask your health care provider for help if you need support or information about quitting drugs. Alcohol use Do not drink alcohol if your health care provider tells you not to drink. If you drink alcohol: Limit how much you have to 0-2 drinks a day. Be aware of how much alcohol is in your drink. In the [place] one drink equals one 12 oz bottle of beer (355 mL), one 5 oz glass of wine (148 mL), or one 1 oz glass of hard liquor (44 mL). General instructions Schedule regular health, dental, and eye exams. Stay current with your vaccines. Tell your health care provider if: You often feel depressed. You have ever been abused or do not feel safe at home. Summary Adopting a healthy lifestyle and getting preventive care are important in promoting health and wellness. Follow your health care provider's instructions about healthy diet, exercising, and getting tested or screened for diseases. Follow your health care provider's instructions on monitoring your cholesterol and blood pressure. This information is not intended to replace advice given to you by your health care provider. Make sure you discuss any questions you have with your healthcare provider. Document Revised: 06/10/2018 Document Reviewed: 06/10/2018 Elsevier Patient Education  2022 [employer]  

## 2021-02-14 LAB — URINALYSIS, ROUTINE W REFLEX MICROSCOPIC
Bilirubin Urine: NEGATIVE
Hgb urine dipstick: NEGATIVE
Ketones, ur: NEGATIVE
Leukocytes,Ua: NEGATIVE
Nitrite: NEGATIVE
RBC / HPF: NONE SEEN (ref 0–?)
Specific Gravity, Urine: 1.01 (ref 1.000–1.030)
Total Protein, Urine: NEGATIVE
Urine Glucose: NEGATIVE
Urobilinogen, UA: 0.2 (ref 0.0–1.0)
WBC, UA: NONE SEEN (ref 0–?)
pH: 6 (ref 5.0–8.0)

## 2021-02-14 LAB — PSA: PSA: 1.9 ng/mL (ref 0.10–4.00)

## 2021-02-14 LAB — TSH: TSH: 1.89 u[IU]/mL (ref 0.35–5.50)

## 2021-02-16 ENCOUNTER — Encounter: Payer: Self-pay | Admitting: Internal Medicine

## 2021-02-16 DIAGNOSIS — Z0001 Encounter for general adult medical examination with abnormal findings: Secondary | ICD-10-CM | POA: Insufficient documentation

## 2021-02-16 DIAGNOSIS — Z23 Encounter for immunization: Secondary | ICD-10-CM | POA: Insufficient documentation

## 2021-02-16 DIAGNOSIS — N400 Enlarged prostate without lower urinary tract symptoms: Secondary | ICD-10-CM | POA: Insufficient documentation

## 2021-03-05 NOTE — Progress Notes (Signed)
Cardiology Office Note:    Date:  03/06/2021   ID:  Fernando Saunders, DOB July 03, 1940, MRN [id number]  PCP:  Etta Grandchild, MD   Cardiologist:  None  Electrophysiologist:  Lanier Prude, MD   Referring MD: Mliss Sax   Chief Complaint/Reason for Referral: Dizziness, Chest pain, moderate CAD  History of Present Illness:    Fernando Saunders is a 80 y.o. male with a history of HLD and GERD who presents for further evaluation of presyncope and bradycardia, as well as recent assessment of possible cardiac chest pain.   His daughter is present and serves as his interpreter per his preference.  Visit assisted with in person interpreter Alinda Money - Spanish language.  Today he feels a little better - chest pressure is better. Less worry these days and also feels like it might be gas.   Dizziness is a lot less, doing less positional activities that used to provoke.  Feels he has an upper respiratory infection - symptoms of productive cough and right sided headache and drainage for over 3 years. Discussed use of nasal saline, and otherwise recommend reviewing with primary care doctor.  The patient denies dyspnea at rest or with exertion, palpitations, PND, orthopnea, or leg swelling. Denies fever, chills. Denies nausea, vomiting. Denies syncope or presyncope. Denies dizziness or lightheadedness.    Past Medical History:  Diagnosis Date   Arthritis    Diverticulosis    GERD (gastroesophageal reflux disease)    Hyperlipidemia     Past Surgical History:  Procedure Laterality Date   COLONOSCOPY  11/04/2011   Dr. Stan Head   EYE SURGERY      Current Medications: Current Meds  Medication Sig   aspirin EC 81 MG tablet Take 1 tablet (81 mg total) by mouth daily. Swallow whole.   celecoxib (CELEBREX) 100 MG capsule Take 1 capsule (100 mg total) by mouth 2 (two) times daily.   gabapentin (NEURONTIN) 100 MG capsule Take 1-2 capsules (100-200 mg total) by mouth at bedtime.    GEMTESA 75 MG TABS Take 1 tablet by mouth daily.   lisinopril (ZESTRIL) 10 MG tablet Take 1 tablet by mouth once daily   Multiple Vitamin (MULTIVITAMIN) tablet Take 1 tablet by mouth daily.   MYRBETRIQ 50 MG TB24 tablet Take 1 tablet by mouth daily.   omega-3 acid ethyl esters (LOVAZA) 1 g capsule Take by mouth as needed.   omeprazole (PRILOSEC) 40 MG capsule Take 1 capsule (40 mg total) by mouth daily.   tamsulosin (FLOMAX) 0.4 MG CAPS capsule Take 0.4 mg by mouth daily.    triamcinolone cream (KENALOG) 0.1 % Apply 1 application topically daily as needed.   [DISCONTINUED] atorvastatin (LIPITOR) 40 MG tablet Take 1 tablet (40 mg total) by mouth daily.     Allergies:   Patient has no known allergies.   Social History   Tobacco Use   Smoking status: Never   Smokeless tobacco: Never  Vaping Use   Vaping Use: Never used  Substance Use Topics   Alcohol use: Yes    Alcohol/week: 0.0 standard drinks    Comment: occasional   Drug use: No     Family History: The patient's family history includes Cancer in his sister; Diabetes in his mother. There is no history of Colon cancer, Stomach cancer, or Heart attack.  ROS:   Please see the history of present illness.    All other systems reviewed and are negative.  EKGs/Labs/Other Studies Reviewed:    The following  studies were reviewed today:  EKG: 03/06/21 - Sinus bradycardia rate 53 09/05/20 - Sinus bradycardia, PAC  Recent Labs: 02/13/2021: ALT 23; BUN 18; Creatinine, Ser 1.11; Hemoglobin 13.0; Platelets 202.0; Potassium 4.2; Sodium 137; TSH 1.89  Recent Lipid Panel    Component Value Date/Time   CHOL 103 02/13/2021 1659   CHOL 219 (H) 01/11/2017 1450   TRIG 104.0 02/13/2021 1659   HDL 36.70 (L) 02/13/2021 1659   HDL 37 (L) 01/11/2017 1450   CHOLHDL 3 02/13/2021 1659   VLDL 20.8 02/13/2021 1659   LDLCALC 45 02/13/2021 1659   LDLCALC 144 (H) 01/11/2017 1450   LDLDIRECT 122.0 12/07/2019 0829    Physical Exam:    VS:  BP (!)  150/90 (BP Location: Right Arm)   Pulse (!) 53   Ht 6' (1.829 m)   Wt 188 lb 3.2 oz (85.4 kg)   SpO2 97%   BMI 25.52 kg/m     Wt Readings from Last 5 Encounters:  03/06/21 188 lb 3.2 oz (85.4 kg)  02/13/21 183 lb (83 kg)  09/05/20 188 lb (85.3 kg)  05/15/20 188 lb 9.6 oz (85.5 kg)  05/05/20 186 lb 14.4 oz (84.8 kg)    Constitutional: No acute distress Eyes: sclera non-icteric, normal conjunctiva and lids ENMT: normal dentition, moist mucous membranes Cardiovascular: regular rhythm, bradycardic rate, no murmurs. S1 and S2 normal. Radial pulses normal bilaterally. No jugular venous distention.  Respiratory: clear to auscultation bilaterally GI : normal bowel sounds, soft and nontender. No distention.   MSK: extremities warm, well perfused. No edema.  NEURO: grossly nonfocal exam, moves all extremities. PSYCH: alert and oriented x 3, normal mood and affect.   ASSESSMENT:    1. Precordial pain   2. Dizziness   3. Bradycardia   4. Mixed hyperlipidemia   5. Essential hypertension   6. Medication management     PLAN:    Precordial pain -  Medication management.  - moderate CAD on CCTA, FFR unremarkable. - continue ASA 81 mg daily, discussed rationale for continued use. - continue Atorvastatin 40 mg daily, refills provided today. - no BB due to bradycardia.   Mixed hyperlipidemia- - on atorvastatin 40 mg daily, most recent LDL 45 and optimized lipids otherwise. Continue statin.  Bradycardia Dizziness - Symptoms have improved, mostly positional. - Avoid AV nodal blocking agents.  HTN - continue lisinopril 10 mg daily, BP is elevated today but had not taken the medications. Also on tamsulosin, montior use with dizziness.  Total time of encounter: 30 minutes total time of encounter, including 23 minutes spent in face-to-face patient care on the date of this encounter. This time includes coordination of care and counseling regarding above mentioned problem list. Remainder  of non-face-to-face time involved reviewing chart documents/testing relevant to the patient encounter and documentation in the medical record. I have independently reviewed documentation from referring provider.   Weston Brass, MD, [facility] [place]  [facility]     Medication Adjustments/Labs and Tests Ordered: Current medicines are reviewed at length with the patient today.  Concerns regarding medicines are outlined above.   Orders Placed This Encounter  Procedures   EKG 12-Lead       Meds ordered this encounter  Medications   atorvastatin (LIPITOR) 40 MG tablet    Sig: Take 1 tablet (40 mg total) by mouth daily.    Dispense:  90 tablet    Refill:  3    STOP PRAVASTATIN     Patient Instructions  Medication  Instructions:  No Changes In Medications at this time.  *If you need a refill on your cardiac medications before your next appointment, please call your pharmacy*  Follow-Up: At [facility], you and your health needs are our priority.  As part of our continuing mission to provide you with exceptional heart care, we have created designated Provider Care Teams.  These Care Teams include your primary Cardiologist (physician) and Advanced Practice Providers (APPs -  Physician Assistants and Nurse Practitioners) who all work together to provide you with the care you need, when you need it.  Your next appointment:   7 month(s)  The format for your next appointment:   In Person  Provider:   Weston Brass, MD

## 2021-03-06 ENCOUNTER — Encounter: Payer: Self-pay | Admitting: Internal Medicine

## 2021-03-06 ENCOUNTER — Ambulatory Visit (INDEPENDENT_AMBULATORY_CARE_PROVIDER_SITE_OTHER): Payer: Medicare Other | Admitting: Internal Medicine

## 2021-03-06 ENCOUNTER — Other Ambulatory Visit: Payer: Self-pay

## 2021-03-06 VITALS — BP 150/90 | HR 53 | Ht 72.0 in | Wt 188.2 lb

## 2021-03-06 DIAGNOSIS — R001 Bradycardia, unspecified: Secondary | ICD-10-CM

## 2021-03-06 DIAGNOSIS — I10 Essential (primary) hypertension: Secondary | ICD-10-CM | POA: Diagnosis not present

## 2021-03-06 DIAGNOSIS — R072 Precordial pain: Secondary | ICD-10-CM

## 2021-03-06 DIAGNOSIS — R42 Dizziness and giddiness: Secondary | ICD-10-CM

## 2021-03-06 DIAGNOSIS — E782 Mixed hyperlipidemia: Secondary | ICD-10-CM

## 2021-03-06 DIAGNOSIS — Z79899 Other long term (current) drug therapy: Secondary | ICD-10-CM | POA: Diagnosis not present

## 2021-03-06 MED ORDER — ATORVASTATIN CALCIUM 40 MG PO TABS
40.0000 mg | ORAL_TABLET | Freq: Every day | ORAL | 3 refills | Status: DC
Start: 2021-03-06 — End: 2022-05-13

## 2021-03-06 NOTE — Patient Instructions (Signed)
Medication Instructions:  No Changes In Medications at this time.  *If you need a refill on your cardiac medications before your next appointment, please call your pharmacy*  Follow-Up: At [facility], you and your health needs are our priority.  As part of our continuing mission to provide you with exceptional heart care, we have created designated Provider Care Teams.  These Care Teams include your primary Cardiologist (physician) and Advanced Practice Providers (APPs -  Physician Assistants and Nurse Practitioners) who all work together to provide you with the care you need, when you need it.  Your next appointment:   7 month(s)  The format for your next appointment:   In Person  Provider:   Gayatri Acharya, MD 

## 2021-04-19 ENCOUNTER — Ambulatory Visit: Payer: Medicare Other | Admitting: Internal Medicine

## 2021-05-07 DIAGNOSIS — R3915 Urgency of urination: Secondary | ICD-10-CM | POA: Diagnosis not present

## 2021-05-28 DIAGNOSIS — Z961 Presence of intraocular lens: Secondary | ICD-10-CM | POA: Diagnosis not present

## 2021-05-28 DIAGNOSIS — H35351 Cystoid macular degeneration, right eye: Secondary | ICD-10-CM | POA: Diagnosis not present

## 2021-06-04 DIAGNOSIS — H35371 Puckering of macula, right eye: Secondary | ICD-10-CM | POA: Diagnosis not present

## 2021-06-04 DIAGNOSIS — H34811 Central retinal vein occlusion, right eye, with macular edema: Secondary | ICD-10-CM | POA: Diagnosis not present

## 2021-06-04 DIAGNOSIS — H43812 Vitreous degeneration, left eye: Secondary | ICD-10-CM | POA: Diagnosis not present

## 2021-06-04 DIAGNOSIS — H35362 Drusen (degenerative) of macula, left eye: Secondary | ICD-10-CM | POA: Diagnosis not present

## 2021-06-11 DIAGNOSIS — H34811 Central retinal vein occlusion, right eye, with macular edema: Secondary | ICD-10-CM | POA: Diagnosis not present

## 2021-06-12 ENCOUNTER — Ambulatory Visit: Payer: Medicare Other | Admitting: Internal Medicine

## 2021-07-09 DIAGNOSIS — H35371 Puckering of macula, right eye: Secondary | ICD-10-CM | POA: Diagnosis not present

## 2021-07-09 DIAGNOSIS — H43812 Vitreous degeneration, left eye: Secondary | ICD-10-CM | POA: Diagnosis not present

## 2021-07-09 DIAGNOSIS — H35362 Drusen (degenerative) of macula, left eye: Secondary | ICD-10-CM | POA: Diagnosis not present

## 2021-07-09 DIAGNOSIS — H34811 Central retinal vein occlusion, right eye, with macular edema: Secondary | ICD-10-CM | POA: Diagnosis not present

## 2021-07-16 ENCOUNTER — Other Ambulatory Visit: Payer: Self-pay

## 2021-07-16 ENCOUNTER — Ambulatory Visit (INDEPENDENT_AMBULATORY_CARE_PROVIDER_SITE_OTHER): Payer: Medicare Other

## 2021-07-16 ENCOUNTER — Encounter: Payer: Self-pay | Admitting: Internal Medicine

## 2021-07-16 ENCOUNTER — Ambulatory Visit (INDEPENDENT_AMBULATORY_CARE_PROVIDER_SITE_OTHER): Payer: Medicare Other | Admitting: Internal Medicine

## 2021-07-16 VITALS — BP 120/72 | HR 61 | Temp 98.6°F | Resp 16 | Ht 72.0 in | Wt 187.0 lb

## 2021-07-16 VITALS — BP 120/72 | HR 61 | Temp 98.6°F | Ht 72.0 in | Wt 187.0 lb

## 2021-07-16 DIAGNOSIS — R202 Paresthesia of skin: Secondary | ICD-10-CM | POA: Diagnosis not present

## 2021-07-16 DIAGNOSIS — M5412 Radiculopathy, cervical region: Secondary | ICD-10-CM

## 2021-07-16 DIAGNOSIS — Z23 Encounter for immunization: Secondary | ICD-10-CM

## 2021-07-16 DIAGNOSIS — M5030 Other cervical disc degeneration, unspecified cervical region: Secondary | ICD-10-CM | POA: Diagnosis not present

## 2021-07-16 DIAGNOSIS — M4802 Spinal stenosis, cervical region: Secondary | ICD-10-CM

## 2021-07-16 DIAGNOSIS — Z0000 Encounter for general adult medical examination without abnormal findings: Secondary | ICD-10-CM | POA: Diagnosis not present

## 2021-07-16 LAB — FOLATE: Folate: 14.5 ng/mL (ref >5.9)

## 2021-07-16 LAB — VITAMIN B12: Vitamin B-12: 444 pg/mL (ref 211–911)

## 2021-07-16 MED ORDER — GABAPENTIN 100 MG PO CAPS: 100.0000 mg | ORAL_CAPSULE | Freq: Every day | ORAL | 1 refills | Status: DC

## 2021-07-16 NOTE — Progress Notes (Signed)
Subjective:  Patient ID: Fernando Saunders, male    DOB: 1940-10-19  Age: 81 y.o. MRN: [id number]  CC: Follow-up  This visit occurred during the SARS-CoV-2 public health emergency.  Safety protocols were in place, including screening questions prior to the visit, additional usage of staff PPE, and extensive cleaning of exam room while observing appropriate contact time as indicated for disinfecting solutions.    [name] -  He complains of a 3292-month history of facial paresthesias that occur while sleeping.  He describes it as an itchy sensation under the skin but the skin actually looks normal.  He was previously prescribed gabapentin for cervical spinal stenosis but is no longer taking it.  Outpatient Medications Prior to Visit  Medication Sig Dispense Refill   aspirin EC 81 MG tablet Take 1 tablet (81 mg total) by mouth daily. Swallow whole. 100 tablet 3   atorvastatin (LIPITOR) 40 MG tablet Take 1 tablet (40 mg total) by mouth daily. 90 tablet 3   celecoxib (CELEBREX) 100 MG capsule Take 1 capsule (100 mg total) by mouth 2 (two) times daily. 180 capsule 1   GEMTESA 75 MG TABS Take 1 tablet by mouth daily.     lisinopril (ZESTRIL) 10 MG tablet Take 1 tablet by mouth once daily 90 tablet 0   Multiple Vitamin (MULTIVITAMIN) tablet Take 1 tablet by mouth daily.     MYRBETRIQ 50 MG TB24 tablet Take 1 tablet by mouth daily.     omega-3 acid ethyl esters (LOVAZA) 1 g capsule Take by mouth as needed.     omeprazole (PRILOSEC) 40 MG capsule Take 1 capsule (40 mg total) by mouth daily. 30 capsule 3   tamsulosin (FLOMAX) 0.4 MG CAPS capsule Take 0.4 mg by mouth daily.     triamcinolone cream (KENALOG) 0.1 % Apply 1 application topically daily as needed. 453 g 2   gabapentin (NEURONTIN) 100 MG capsule Take 1-2 capsules (100-200 mg total) by mouth at bedtime. 60 capsule 2   No facility-administered medications prior to visit.    ROS Review of Systems  Constitutional:   Negative for diaphoresis, fatigue and unexpected weight change.  HENT: Negative.    Eyes: Negative.   Respiratory:  Negative for cough, chest tightness and wheezing.   Cardiovascular:  Negative for chest pain, palpitations and leg swelling.  Gastrointestinal:  Negative for abdominal pain, constipation, diarrhea and vomiting.  Endocrine: Negative.   Genitourinary: Negative.  Negative for difficulty urinating.  Musculoskeletal: Negative.   Skin: Negative.   Allergic/Immunologic: Negative.   Neurological:  Positive for numbness. Negative for dizziness, seizures, syncope, facial asymmetry, speech difficulty, weakness, light-headedness and headaches.  Hematological:  Negative for adenopathy.  Psychiatric/Behavioral: Negative.     Objective:  BP 120/72    Pulse 61    Temp 98.6 F (37 C) (Oral)    Ht 6' (1.829 m)    Wt 187 lb (84.8 kg)    SpO2 94%    BMI 25.36 kg/m   BP Readings from Last 3 Encounters:  07/16/21 120/72  07/16/21 120/72  03/06/21 (!) 150/90    Wt Readings from Last 3 Encounters:  07/16/21 187 lb (84.8 kg)  07/16/21 187 lb (84.8 kg)  03/06/21 188 lb 3.2 oz (85.4 kg)    Physical Exam Vitals reviewed.  HENT:     Nose: Nose normal.     Mouth/Throat:     Mouth: Mucous membranes are moist.  Eyes:     General: No scleral  icterus.    Extraocular Movements: Extraocular movements intact.     Pupils: Pupils are equal, round, and reactive to light.  Cardiovascular:     Rate and Rhythm: Normal rate and regular rhythm.     Heart sounds: No murmur heard. Pulmonary:     Breath sounds: No stridor. No wheezing, rhonchi or rales.  Abdominal:     General: Abdomen is flat.     Palpations: There is no mass.     Tenderness: There is no abdominal tenderness. There is no guarding.     Hernia: No hernia is present.  Musculoskeletal:        General: Normal range of motion.     Cervical back: Neck supple.     Right lower leg: No edema.     Left lower leg: No edema.   Lymphadenopathy:     Cervical: No cervical adenopathy.  Skin:    General: Skin is warm and dry.  Neurological:     General: No focal deficit present.     Mental Status: He is alert and oriented to person, place, and time. Mental status is at baseline.  Psychiatric:        Mood and Affect: Mood normal.        Behavior: Behavior normal.    Lab Results  Component Value Date   WBC 7.4 02/13/2021   HGB 13.0 02/13/2021   HCT 39.9 02/13/2021   PLT 202.0 02/13/2021   GLUCOSE 82 02/13/2021   CHOL 103 02/13/2021   TRIG 104.0 02/13/2021   HDL 36.70 (L) 02/13/2021   LDLDIRECT 122.0 12/07/2019   LDLCALC 45 02/13/2021   ALT 23 02/13/2021   AST 24 02/13/2021   NA 137 02/13/2021   K 4.2 02/13/2021   CL 103 02/13/2021   CREATININE 1.11 02/13/2021   BUN 18 02/13/2021   CO2 28 02/13/2021   TSH 1.89 02/13/2021   PSA 1.90 02/13/2021    CT CORONARY MORPH W/CTA COR W/SCORE W/CA W/CM &/OR WO/CM  Addendum Date: 10/06/2020   ADDENDUM REPORT: 10/06/2020 11:35 CLINICAL DATA:  81 Year old Hispanic Male EXAM: Cardiac/Coronary  CTA TECHNIQUE: The patient was scanned on a [employer] scanner. FINDINGS: A 100 kV prospective scan was triggered in the descending thoracic aorta at 111 HU's. Axial non-contrast 3 mm slices were carried out through the heart. The data set was analyzed on a dedicated work station and scored using the Agatson method. Gantry rotation speed was 250 msecs and collimation was .6 mm. No beta blockade and 0.8 mg of sl NTG was given. The 3D data set was reconstructed in 5% intervals of the 67-82 % of the R-R cycle. Diastolic phases were analyzed on a dedicated work station using MPR, MIP and VRT modes. The patient received 90 cc of contrast. Aorta: Normal size. Ascending aortic atherosclerosis noted. No dissection. Aortic Valve:  Tri-leaflet.  Minimal annular calcification. Coronary Arteries:  Normal coronary origin.  Right dominance. Coronary Calcium Score: Left main: 44 Left anterior  descending artery: 130 Left circumflex artery: 0 Right coronary artery: 0 Total: 174 Percentile: 58th for age, sex, and race matched control. RCA is a large dominant artery that gives rise to PDA and PLA. There is no plaque. Left main is a large artery that gives rise to LAD and LCX arteries. There is a distal calcific plaque < 10%. LAD is a large vessel that gives rise to one large D1 Branch and a smaller D2 vessel. There is a mild non obstructive (25-49%) calcific  plaque in the ostial vessel. There is a mild non obstructive (25-49%) calcific plaque in the proximal vessel. There is a moderate stenosis (50-70%) calcific plaque in the mid vessel. LCX is a non-dominant artery that gives rise to two OM vessels. There is no plaque. Other findings: Normal pulmonary vein drainage into the left atrium. Normal left atrial appendage without a thrombus. Normal size of the pulmonary artery. Cannot rule out small patent foramen ovale. Extra-cardiac findings: See attached radiology report for non-cardiac structures. IMPRESSION: 1. Coronary calcium score of 174. This was 58th percentile for age, sex, and race matched control. 2. Normal coronary origin with Right dominance. 3. CAD-RADS 3. Moderate stenosis in LAD system. Consider symptom-guided anti-ischemic pharmacotherapy as well as risk factor modification per guideline directed care. Additional analysis with CT FFR will be submitted. 4. Ascending aortic atherosclerosis noted. 5. Cannot rule out small patent foramen ovale. RECOMMENDATIONS: Coronary artery calcium (CAC) score is a strong predictor of incident coronary heart disease (CHD) and provides predictive information beyond traditional risk factors. CAC scoring is reasonable to use in the decision to withhold, postpone, or initiate statin therapy in intermediate-risk or selected borderline-risk asymptomatic adults (age 55-75 years and LDL-C ?70 to <190 mg/dL) who do not have diabetes or established atherosclerotic  cardiovascular disease (ASCVD).* In intermediate-risk (10-year ASCVD risk ?7.5% to <20%) adults or selected borderline-risk (10-year ASCVD risk ?5% to <7.5%) adults in whom a CAC score is measured for the purpose of making a treatment decision the following recommendations have been made: If CAC = 0, it is reasonable to withhold statin therapy and reassess in 5 to 10 years, as long as higher risk conditions are absent (diabetes mellitus, family history of premature CHD in first degree relatives (males <55 years; females <65 years), cigarette smoking, LDL ?190 mg/dL or other independent risk factors). If CAC is 1 to 99, it is reasonable to initiate statin therapy for patients ?79 years of age. If CAC is ?100 or ?75th percentile, it is reasonable to initiate statin therapy at any age. Cardiology referral should be considered for patients with CAC scores ?400 or ?75th percentile. *2018 AHA/ACC/AACVPR/AAPA/ABC/ACPM/ADA/AGS/APhA/ASPC/NLA/PCNA Guideline on the Management of Blood Cholesterol: A Report of the American College of Cardiology/American Heart Association Task Force on Clinical Practice Guidelines. J Am Coll Cardiol. 2019;73(24):3168-3209. Riley Lam, MD Electronically Signed   By: Riley Lam MD   On: 10/06/2020 11:35   Result Date: 10/06/2020 EXAM: OVER-READ INTERPRETATION  CT CHEST The following report is an over-read performed by radiologist Dr. Trudie Reed of [facility] on 10/06/2020. This over-read does not include interpretation of cardiac or coronary anatomy or pathology. The coronary calcium score/coronary CTA interpretation by the cardiologist is attached. COMPARISON:  None. FINDINGS: Aortic atherosclerosis. Within the visualized portions of the thorax there are no suspicious appearing pulmonary nodules or masses, there is no acute consolidative airspace disease, no pleural effusions, no pneumothorax and no lymphadenopathy. Visualized portions of the upper abdomen  are unremarkable. There are no aggressive appearing lytic or blastic lesions noted in the visualized portions of the skeleton. IMPRESSION: 1.  Aortic Atherosclerosis (ICD10-I70.0). Electronically Signed: By: Trudie Reed M.D. On: 10/06/2020 08:18   CT CORONARY FRACTIONAL FLOW RESERVE DATA PREP  Result Date: 10/08/2020 EXAM: CT-FFR ANALYSIS CLINICAL DATA:  Possibly obstructive coronary lesion FINDINGS: CT-FFR analysis was performed on the original cardiac CT angiogram dataset. Diagrammatic representation of the CT-FFR analysis is provided in a separate PDF document in PACS. This dictation was created using the  PDF document and an interactive 3D model of the results. 3D model is not available in the EMR/PACS. Normal FFR range is >0.80. 1. Left Main: No significant functional stenosis, CT-FFR 0.98 distal vessel 2. LAD: Possibly significant functional stenosis in the distal LAD CT-FFR 0.83. 3. LCX: No significant functional stenosis, CT-FFR 0.92 distal vessel. 4. RCA: No significant functional stenosis, CT-FFR 0.94 distal vessel. IMPRESSION: 1. CT FFR analysis shows borderline evidence of significant functional stenosis in the distal LAD. Riley Lam MD Electronically Signed   By: Riley Lam MD   On: 10/08/2020 15:09    Assessment & Plan:   Maikol was seen today for follow-up.  Diagnoses and all orders for this visit:  Facial paresthesia- There are no deficiencies in B1, B12, or folate.  Will restart gabapentin. -     Vitamin B12; Future -     Vitamin B1; Future -     Folate; Future -     gabapentin (NEURONTIN) 100 MG capsule; Take 1 capsule (100 mg total) by mouth at bedtime. -     Folate -     Vitamin B1 -     Vitamin B12  Cervical radiculopathy  DDD (degenerative disc disease), cervical  Spinal stenosis of cervical region  Need for varicella vaccine -     Zoster Vaccine Adjuvanted [facility]) injection; Inject 0.5 mLs into the muscle once for 1 dose.   I have  changed Fernando Saunders's gabapentin. I am also having him start on Shingrix. Additionally, I am having him maintain his multivitamin, Myrbetriq, tamsulosin, omeprazole, omega-3 acid ethyl esters, triamcinolone cream, lisinopril, aspirin EC, celecoxib, Gemtesa, and atorvastatin.  Meds ordered this encounter  Medications   gabapentin (NEURONTIN) 100 MG capsule    Sig: Take 1 capsule (100 mg total) by mouth at bedtime.    Dispense:  90 capsule    Refill:  1   Zoster Vaccine Adjuvanted [facility]) injection    Sig: Inject 0.5 mLs into the muscle once for 1 dose.    Dispense:  0.5 mL    Refill:  1     Follow-up: Return in about 3 months (around 10/14/2021).  Sanda Linger, MD

## 2021-07-16 NOTE — Progress Notes (Signed)
Subjective:   Fernando Saunders is a 81 y.o. male who presents for Medicare Annual/Subsequent preventive examination.  Review of Systems     Cardiac Risk Factors include: advanced age (>42men, >42 women);dyslipidemia;hypertension;male gender     Objective:    Today's Vitals   07/16/21 0955  BP: 120/72  Pulse: 61  Resp: 16  Temp: 98.6 F (37 C)  TempSrc: Oral  SpO2: 94%  Weight: 187 lb (84.8 kg)  Height: 6' (1.829 m)  PainSc: 7   PainLoc: Shoulder   Body mass index is 25.36 kg/m.  Advanced Directives 07/16/2021 01/28/2020 01/11/2017  Does Patient Have a Medical Advance Directive? No No No  Would patient like information on creating a medical advance directive? No - Patient declined - -    Current Medications (verified) Outpatient Encounter Medications as of 07/16/2021  Medication Sig   aspirin EC 81 MG tablet Take 1 tablet (81 mg total) by mouth daily. Swallow whole.   atorvastatin (LIPITOR) 40 MG tablet Take 1 tablet (40 mg total) by mouth daily.   celecoxib (CELEBREX) 100 MG capsule Take 1 capsule (100 mg total) by mouth 2 (two) times daily.   GEMTESA 75 MG TABS Take 1 tablet by mouth daily.   Multiple Vitamin (MULTIVITAMIN) tablet Take 1 tablet by mouth daily.   MYRBETRIQ 50 MG TB24 tablet Take 1 tablet by mouth daily.   omega-3 acid ethyl esters (LOVAZA) 1 g capsule Take by mouth as needed.   omeprazole (PRILOSEC) 40 MG capsule Take 1 capsule (40 mg total) by mouth daily.   gabapentin (NEURONTIN) 100 MG capsule Take 1-2 capsules (100-200 mg total) by mouth at bedtime.   lisinopril (ZESTRIL) 10 MG tablet Take 1 tablet by mouth once daily   tamsulosin (FLOMAX) 0.4 MG CAPS capsule Take 0.4 mg by mouth daily.   triamcinolone cream (KENALOG) 0.1 % Apply 1 application topically daily as needed.   No facility-administered encounter medications on file as of 07/16/2021.    Allergies (verified) Patient has no known allergies.   History: Past Medical History:  Diagnosis  Date   Arthritis    Diverticulosis    GERD (gastroesophageal reflux disease)    Hyperlipidemia    Past Surgical History:  Procedure Laterality Date   COLONOSCOPY  11/04/2011   Dr. Silvano Rusk   EYE SURGERY     Family History  Problem Relation Age of Onset   Diabetes Mother    Cancer Sister    Colon cancer Neg Hx    Stomach cancer Neg Hx    Heart attack Neg Hx    Social History   Socioeconomic History   Marital status: Married    Spouse name: Not on file   Number of children: 2   Years of education: Not on file   Highest education level: Not on file  Occupational History   Occupation: Unemployed  Tobacco Use   Smoking status: Never   Smokeless tobacco: Never  Vaping Use   Vaping Use: Never used  Substance and Sexual Activity   Alcohol use: Yes    Alcohol/week: 0.0 standard drinks    Comment: occasional   Drug use: No   Sexual activity: Not on file  Other Topics Concern   Not on file  Social History Narrative   Not on file   Social Determinants of Health   Financial Resource Strain: Low Risk    Difficulty of Paying Living Expenses: Not hard at all  Food Insecurity: No Food Insecurity   Worried  About Running Out of Food in the Last Year: Never true   Ran Out of Food in the Last Year: Never true  Transportation Needs: No Transportation Needs   Lack of Transportation (Medical): No   Lack of Transportation (Non-Medical): No  Physical Activity: Sufficiently Active   Days of Exercise per Week: 5 days   Minutes of Exercise per Session: 30 min  Stress: No Stress Concern Present   Feeling of Stress : Not at all  Social Connections: Unknown   Frequency of Communication with Friends and Family: More than three times a week   Frequency of Social Gatherings with Friends and Family: More than three times a week   Attends Religious Services: Patient refused   Marine scientist or Organizations: Patient refused   Attends Music therapist: Patient  refused   Marital Status: Patient refused    Tobacco Counseling Counseling given: Not Answered   Clinical Intake:  Pre-visit preparation completed: Yes  Pain : No/denies pain Pain Score: 7      BMI - recorded: 25.36 Nutritional Status: BMI 25 -29 Overweight Nutritional Risks: None Diabetes: No  How often do you need to have someone help you when you read instructions, pamphlets, or other written materials from your doctor or pharmacy?: 1 - Never What is the last grade level you completed in school?: Junior year  Diabetic? no  Interpreter Needed?: No (Daughter accompanied him to appt.)  Information entered by :: Lisette Abu, LPN   Activities of Daily Living In your present state of health, do you have any difficulty performing the following activities: 07/16/2021 02/13/2021  Hearing? N N  Vision? N N  Difficulty concentrating or making decisions? N N  Walking or climbing stairs? N N  Dressing or bathing? N N  Doing errands, shopping? N N  Preparing Food and eating ? N -  Using the Toilet? N -  In the past six months, have you accidently leaked urine? N -  Do you have problems with loss of bowel control? N -  Managing your Medications? N -  Managing your Finances? N -  Housekeeping or managing your Housekeeping? N -  Some recent data might be hidden    Patient Care Team: Janith Lima, MD as PCP - General (Internal Medicine) Vickie Epley, MD as PCP - Electrophysiology (Cardiology)  Indicate any recent Medical Services you may have received from other than [facility] providers in the past year (date may be approximate).     Assessment:   This is a routine wellness examination for [place].  Hearing/Vision screen Hearing Screening - Comments:: Patient denied any hearing difficulty.   No hearing aids.  Vision Screening - Comments:: Patient does not wear corrective glasses/contacts.  Eye exam done annually by: Dr. Rutherford Guys and [facility].    Dietary issues and exercise activities discussed: Current Exercise Habits: Home exercise routine, Type of exercise: walking, Time (Minutes): 30, Frequency (Times/Week): 5, Weekly Exercise (Minutes/Week): 150, Intensity: Moderate, Exercise limited by: None identified   Goals Addressed               This Visit's Progress     Patient Stated (pt-stated)        My goal is to stay healthy.      Depression Screen PHQ 2/9 Scores 07/16/2021 02/13/2021 12/06/2019 10/10/2014 08/08/2014  PHQ - 2 Score 0 0 0 0 0    Fall Risk Fall Risk  07/16/2021 02/13/2021 12/06/2019 01/11/2017 10/10/2014  Falls in the past year? 0 0 0 Yes No  Number falls in past yr: 0 - - 1 -  Injury with Fall? 0 - - Yes -  Comment - - - neck pain -  Risk Factor Category  - - - High Fall Risk -  Risk for fall due to : No Fall Risks - - - -  Follow up Falls evaluation completed - - Falls evaluation completed -    FALL RISK PREVENTION PERTAINING TO THE HOME:  Any stairs in or around the home? Yes  If so, are there any without handrails? No  Home free of loose throw rugs in walkways, pet beds, electrical cords, etc? Yes  Adequate lighting in your home to reduce risk of falls? Yes   ASSISTIVE DEVICES UTILIZED TO PREVENT FALLS:  Life alert? No  Use of a cane, walker or w/c? No  Grab bars in the bathroom? No  Shower chair or bench in shower? No  Elevated toilet seat or a handicapped toilet? No   TIMED UP AND GO:  Was the test performed? Yes .  Length of time to ambulate 10 feet: 6 sec.   Gait steady and fast without use of assistive device  Cognitive Function:        Immunizations Immunization History  Administered Date(s) Administered   Influenza,inj,Quad PF,6+ Mos 08/08/2014   Pneumococcal Conjugate-13 11/12/2013, 08/30/2015   Pneumococcal Polysaccharide-23 08/07/2010, 02/13/2021   Tdap 08/07/2010, 02/16/2021    TDAP status: Up to date  Flu Vaccine status: Up to date (patient  stated)  Pneumococcal vaccine status: Up to date  Covid-19 vaccine status: Completed vaccines (patient stated done in another country)  Qualifies for Shingles Vaccine? Yes   Zostavax completed No   Shingrix Completed?: No.    Education has been provided regarding the importance of this vaccine. Patient has been advised to call insurance company to determine out of pocket expense if they have not yet received this vaccine. Advised may also receive vaccine at local pharmacy or Health Dept. Verbalized acceptance and understanding.  Screening Tests Health Maintenance  Topic Date Due   COVID-19 Vaccine (1) Never done   Hepatitis C Screening  Never done   Zoster Vaccines- Shingrix (1 of 2) Never done   INFLUENZA VACCINE  01/29/2021   TETANUS/TDAP  02/17/2031   Pneumonia Vaccine 64+ Years old  Completed   HPV VACCINES  Aged Out   COLONOSCOPY (Pts 45-43yrs Insurance coverage will need to be confirmed)  Discontinued    Health Maintenance  Health Maintenance Due  Topic Date Due   COVID-19 Vaccine (1) Never done   Hepatitis C Screening  Never done   Zoster Vaccines- Shingrix (1 of 2) Never done   INFLUENZA VACCINE  01/29/2021    Colorectal cancer screening: No longer required.   Lung Cancer Screening: (Low Dose CT Chest recommended if Age 64-80 years, 30 pack-year currently smoking OR have quit w/in 15years.) does not qualify.   Lung Cancer Screening Referral: no  Additional Screening:  Hepatitis C Screening: does qualify; Completed no  Vision Screening: Recommended annual ophthalmology exams for early detection of glaucoma and other disorders of the eye. Is the patient up to date with their annual eye exam?  Yes  Who is the provider or what is the name of the office in which the patient attends annual eye exams? Rutherford Guys, MD and [facility] If pt is not established with a provider, would they like to be referred to a  provider to establish care? No .   Dental  Screening: Recommended annual dental exams for proper oral hygiene  Community Resource Referral / Chronic Care Management: CRR required this visit?  No   CCM required this visit?  No      Plan:     I have personally reviewed and noted the following in the patients chart:   Medical and social history Use of alcohol, tobacco or illicit drugs  Current medications and supplements including opioid prescriptions. Patient is not currently taking opioid prescriptions. Functional ability and status Nutritional status Physical activity Advanced directives List of other physicians Hospitalizations, surgeries, and ER visits in previous 12 months Vitals Screenings to include cognitive, depression, and falls Referrals and appointments  In addition, I have reviewed and discussed with patient certain preventive protocols, quality metrics, and best practice recommendations. A written personalized care plan for preventive services as well as general preventive health recommendations were provided to patient.     Sheral Flow, LPN   [id number]   Nurse Notes:  Hearing Screening - Comments:: Patient denied any hearing difficulty.   No hearing aids.  Vision Screening - Comments:: Patient does not wear corrective glasses/contacts.  Eye exam done annually by: Dr. Rutherford Guys and [facility].

## 2021-07-16 NOTE — Patient Instructions (Signed)
Paresthesia ?Paresthesia is a burning or prickling feeling. This feeling can happen in any part of the body. It often happens in the hands, arms, legs, or feet. Usually, it is not painful. In most cases, the feeling goes away in a short time and is not a sign of a serious problem. If you have paresthesia that lasts a long time, you need to see your doctor. ?Follow these instructions at home: ?Nutrition ?Eat a healthy diet. This includes: ?Eating foods that are high in fiber. These include beans, whole grains, and fresh fruits and vegetables. ?Limiting foods that are high in fat and sugar. These include fried or sweet foods. ? ?Alcohol use ? ?Do not drink alcohol if: ?Your doctor tells you not to drink. ?You are pregnant, may be pregnant, or are planning to become pregnant. ?If you drink alcohol: ?Limit how much you have to: ?0-1 drink a day for women. ?0-2 drinks a day for men. ?Know how much alcohol is in your drink. In the [place] one drink equals one 12 oz bottle of beer (355 mL), one 5 oz glass of wine (148 mL), or one 1? oz glass of hard liquor (44 mL). ?General instructions ?Take over-the-counter and prescription medicines only as told by your doctor. ?Do not smoke or use any products that contain nicotine or tobacco. If you need help quitting, ask your doctor. ?If you have diabetes, work with your doctor to make sure your blood sugar stays in a healthy range. ?If your feet feel numb: ?Check for redness, warmth, and swelling every day. ?Wear padded socks and comfortable shoes. These help protect your feet. ?Keep all follow-up visits. ?Contact a doctor if: ?You have paresthesia that gets worse or does not go away. ?You lose feeling (have numbness) after an injury. ?Your burning or prickling feeling gets worse when you walk. ?You have pain or cramps. ?You feel dizzy or you faint. ?You have a rash. ?Get help right away if: ?You feel weak or have new weakness in an arm or leg. ?You have trouble walking or  moving. ?You have problems speaking, understanding, or seeing. ?You feel confused. ?You cannot control when you pee (urinate) or poop (have a bowel movement). ?These symptoms may be an emergency. Get help right away. Call 911. ?Do not wait to see if the symptoms will go away. ?Do not drive yourself to the hospital. ?Summary ?Paresthesia is a burning or prickling feeling. It often happens in the hands, arms, legs, or feet. ?In most cases, the feeling goes away in a short time and is not a sign of a serious problem. ?If you have paresthesia that lasts a long time, you need to be seen by your doctor. ?This information is not intended to replace advice given to you by your health care provider. Make sure you discuss any questions you have with your health care provider. ?Document Revised: 02/26/2021 Document Reviewed: 02/26/2021 ?Elsevier Patient Education ? 2022 Elsevier Inc. ? ?

## 2021-07-19 ENCOUNTER — Encounter: Payer: Self-pay | Admitting: Internal Medicine

## 2021-07-20 LAB — VITAMIN B1: Vitamin B1 (Thiamine): 15 nmol/L (ref 8–30)

## 2021-07-21 MED ORDER — SHINGRIX 50 MCG/0.5ML IM SUSR: 0.5000 mL | Freq: Once | INTRAMUSCULAR | 1 refills | Status: AC

## 2021-08-06 DIAGNOSIS — H34811 Central retinal vein occlusion, right eye, with macular edema: Secondary | ICD-10-CM | POA: Diagnosis not present

## 2021-08-06 DIAGNOSIS — H35371 Puckering of macula, right eye: Secondary | ICD-10-CM | POA: Diagnosis not present

## 2021-08-06 DIAGNOSIS — H35362 Drusen (degenerative) of macula, left eye: Secondary | ICD-10-CM | POA: Diagnosis not present

## 2021-08-06 DIAGNOSIS — H43812 Vitreous degeneration, left eye: Secondary | ICD-10-CM | POA: Diagnosis not present

## 2021-08-20 DIAGNOSIS — M25511 Pain in right shoulder: Secondary | ICD-10-CM | POA: Diagnosis not present

## 2021-08-20 DIAGNOSIS — M5412 Radiculopathy, cervical region: Secondary | ICD-10-CM | POA: Diagnosis not present

## 2021-09-07 DIAGNOSIS — H35371 Puckering of macula, right eye: Secondary | ICD-10-CM | POA: Diagnosis not present

## 2021-09-07 DIAGNOSIS — H43812 Vitreous degeneration, left eye: Secondary | ICD-10-CM | POA: Diagnosis not present

## 2021-09-07 DIAGNOSIS — H35362 Drusen (degenerative) of macula, left eye: Secondary | ICD-10-CM | POA: Diagnosis not present

## 2021-09-07 DIAGNOSIS — H34811 Central retinal vein occlusion, right eye, with macular edema: Secondary | ICD-10-CM | POA: Diagnosis not present

## 2021-10-15 DIAGNOSIS — H43813 Vitreous degeneration, bilateral: Secondary | ICD-10-CM | POA: Diagnosis not present

## 2021-10-15 DIAGNOSIS — H35371 Puckering of macula, right eye: Secondary | ICD-10-CM | POA: Diagnosis not present

## 2021-10-15 DIAGNOSIS — H34811 Central retinal vein occlusion, right eye, with macular edema: Secondary | ICD-10-CM | POA: Diagnosis not present

## 2021-11-13 DIAGNOSIS — H34811 Central retinal vein occlusion, right eye, with macular edema: Secondary | ICD-10-CM | POA: Diagnosis not present

## 2021-11-28 NOTE — Progress Notes (Unsigned)
Office Visit    Patient Name: Fernando Saunders Date of Encounter: 11/29/2021  Primary Care Provider:  Etta Grandchild, MD Primary Cardiologist:  Parke Poisson, MD  Chief Complaint    81 year old male with a history of nonobstructive CAD, dizziness/presyncope, bradycardia, hypertension, and hyperlipidemia who presents for follow-up related to bradycardia.  Past Medical History    Past Medical History:  Diagnosis Date   Arthritis    Diverticulosis    GERD (gastroesophageal reflux disease)    Hyperlipidemia    Past Surgical History:  Procedure Laterality Date   COLONOSCOPY  11/04/2011   Dr. Stan Head   EYE SURGERY      Allergies  No Known Allergies  History of Present Illness    81 year old male with the above past medical history including nonobstructive CAD, dizziness/presyncope, bradycardia, hypertension, and hyperlipidemia.  He has a history of resting sinus bradycardia.  He was evaluated in the ED in 2021 for dizziness, he was noted to be bradycardic at the time, HR in the 40s.  30-day event monitor showed brief episode of nonsustained VT, otherwise, no significant arrhythmia.  Echocardiogram in September 2021 showed EF 50 to 55%, normal LV function, G1 DD, mild mitral valve regurgitation, mild aortic valve regurgitation mild dilation of ascending aorta measuring 38 mm.  He was referred to EP.  However, given asymptomatic nature, and unrevealing echocardiogram, no additional work-up was recommended.  Exercise tolerance test in November 2021 showed upsloping ST segment depression of 1 mm during stress in leads II and III, returning to baseline after less than 1 minute of recovery frequent PVCs with exercise.  Follow-up coronary CT angiogram in April 2022 showed calcium score of 174, 50 percentile for age, sex, and race matched control, borderline significant functional stenosis in distal LAD per CT FFR analysis.  He was last seen in the office on 03/06/2021 and reported  significant improvement in dizziness.  He denied symptoms concerning for angina.  BP was mildly elevated.  He presents today for follow-up accompanied by his daughter and in-person Spanish interpreter. Since his last visit he has done well from a cardiac standpoint.  He denies palpitations, dizziness, dyspnea, denies symptoms concerning for angina.  His biggest complaint is that he has had some right arm pain with associated numbness/ tingling. He is following with his orthopedic doctor for this has been told it is likely related to arthritis.  It bothers him most at nighttime. Otherwise, he reports feeling well and denies additional concerns today.  Home Medications    Current Outpatient Medications  Medication Sig Dispense Refill   aspirin EC 81 MG tablet Take 1 tablet (81 mg total) by mouth daily. Swallow whole. 100 tablet 3   atorvastatin (LIPITOR) 40 MG tablet Take 1 tablet (40 mg total) by mouth daily. 90 tablet 3   celecoxib (CELEBREX) 100 MG capsule Take 1 capsule (100 mg total) by mouth 2 (two) times daily. 180 capsule 1   gabapentin (NEURONTIN) 100 MG capsule Take 1 capsule (100 mg total) by mouth at bedtime. 90 capsule 1   GEMTESA 75 MG TABS Take 1 tablet by mouth daily.     lisinopril (ZESTRIL) 10 MG tablet Take 1 tablet by mouth once daily 90 tablet 0   Multiple Vitamin (MULTIVITAMIN) tablet Take 1 tablet by mouth daily.     MYRBETRIQ 50 MG TB24 tablet Take 1 tablet by mouth daily.     omega-3 acid ethyl esters (LOVAZA) 1 g capsule Take by mouth as  needed.     omeprazole (PRILOSEC) 40 MG capsule Take 1 capsule (40 mg total) by mouth daily. 30 capsule 3   tamsulosin (FLOMAX) 0.4 MG CAPS capsule Take 0.4 mg by mouth daily.     triamcinolone cream (KENALOG) 0.1 % Apply 1 application topically daily as needed. 453 g 2   No current facility-administered medications for this visit.     Review of Systems    He denies chest pain, palpitations, dyspnea, pnd, orthopnea, n, v, dizziness,  syncope, edema, weight gain, or early satiety. All other systems reviewed and are otherwise negative except as noted above.   Physical Exam    VS:  BP 134/66   Pulse 61   Ht 6' (1.829 m)   Wt 187 lb (84.8 kg)   SpO2 93%   BMI 25.36 kg/m  GEN: Well nourished, well developed, in no acute distress. HEENT: normal. Neck: Supple, no JVD, carotid bruits, or masses. Cardiac: RRR, no murmurs, rubs, or gallops. No clubbing, cyanosis, edema.  Radials/DP/PT 2+ and equal bilaterally.  Respiratory:  Respirations regular and unlabored, clear to auscultation bilaterally. GI: Soft, nontender, nondistended, BS + x 4. MS: no deformity or atrophy. Skin: warm and dry, no rash. Neuro:  Strength and sensation are intact. Psych: Normal affect.  Accessory Clinical Findings    ECG personally reviewed by me today - NSR, 61 bpm - no acute changes.  Lab Results  Component Value Date   WBC 7.4 02/13/2021   HGB 13.0 02/13/2021   HCT 39.9 02/13/2021   MCV 96.2 02/13/2021   PLT 202.0 02/13/2021   Lab Results  Component Value Date   CREATININE 1.11 02/13/2021   BUN 18 02/13/2021   NA 137 02/13/2021   K 4.2 02/13/2021   CL 103 02/13/2021   CO2 28 02/13/2021   Lab Results  Component Value Date   ALT 23 02/13/2021   AST 24 02/13/2021   ALKPHOS 91 02/13/2021   BILITOT 0.6 02/13/2021   Lab Results  Component Value Date   CHOL 103 02/13/2021   HDL 36.70 (L) 02/13/2021   LDLCALC 45 02/13/2021   LDLDIRECT 122.0 12/07/2019   TRIG 104.0 02/13/2021   CHOLHDL 3 02/13/2021    No results found for: HGBA1C  Assessment & Plan    1. Nonobstructive CAD: ETT in November 2021 showed upsloping ST segment depression of 1 mm during stress in leads II and III, returning to baseline after less than 1 minute of recovery frequent PVCs with exercise.  Follow-up coronary CT angiogram in April 2022 showed calcium score of 174, 50 percentile for age, sex, and race matched control, borderline significant functional  stenosis in distal LAD per CT FFR analysis. Stable with no anginal symptoms. No indication for ischemic evaluation.  Continue aspirin, lisinopril, Lipitor.  2. Dizziness/bradycardia: Echo in September 2021 showed EF 50 to 55%, normal LV function, G1 DD, mild mitral valve regurgitation, mild aortic valve regurgitation mild dilation of ascending aorta measuring 38 mm.  He denies dizziness, palpitations, presyncope, syncope.  Continue to avoid AV nodal blocking agents.  3. Hypertension: BP well controlled. Continue current antihypertensive regimen.   4. Hyperlipidemia: LDL was 45 in 01/2021. Monitored and managed per PCP.  Continue aspirin, Lipitor.  5. Disposition: Follow-up in 1 year.   Joylene Grapes, NP 11/29/2021, 8:40 AM

## 2021-11-29 ENCOUNTER — Encounter: Payer: Self-pay | Admitting: Nurse Practitioner

## 2021-11-29 ENCOUNTER — Ambulatory Visit (INDEPENDENT_AMBULATORY_CARE_PROVIDER_SITE_OTHER): Payer: Medicare Other | Admitting: Nurse Practitioner

## 2021-11-29 VITALS — BP 134/66 | HR 61 | Ht 72.0 in | Wt 187.0 lb

## 2021-11-29 DIAGNOSIS — E785 Hyperlipidemia, unspecified: Secondary | ICD-10-CM

## 2021-11-29 DIAGNOSIS — I2510 Atherosclerotic heart disease of native coronary artery without angina pectoris: Secondary | ICD-10-CM | POA: Diagnosis not present

## 2021-11-29 DIAGNOSIS — I10 Essential (primary) hypertension: Secondary | ICD-10-CM | POA: Diagnosis not present

## 2021-11-29 DIAGNOSIS — R001 Bradycardia, unspecified: Secondary | ICD-10-CM | POA: Diagnosis not present

## 2021-11-29 DIAGNOSIS — R42 Dizziness and giddiness: Secondary | ICD-10-CM

## 2021-11-29 NOTE — Patient Instructions (Signed)
Medication Instructions:  Your physician recommends that you continue on your current medications as directed. Please refer to the Current Medication list given to you today.  *If you need a refill on your cardiac medications before your next appointment, please call your pharmacy*   Lab Work: NONE ordered at this time of appointment   If you have labs (blood work) drawn today and your tests are completely normal, you will receive your results only by: MyChart Message (if you have MyChart) OR A paper copy in the mail If you have any lab test that is abnormal or we need to change your treatment, we will call you to review the results.   Testing/Procedures: NONE ordered at this time of appointment     Follow-Up: At [facility], you and your health needs are our priority.  As part of our continuing mission to provide you with exceptional heart care, we have created designated Provider Care Teams.  These Care Teams include your primary Cardiologist (physician) and Advanced Practice Providers (APPs -  Physician Assistants and Nurse Practitioners) who all work together to provide you with the care you need, when you need it.  We recommend signing up for the patient portal called "MyChart".  Sign up information is provided on this After Visit Summary.  MyChart is used to connect with patients for Virtual Visits (Telemedicine).  Patients are able to view lab/test results, encounter notes, upcoming appointments, etc.  Non-urgent messages can be sent to your provider as well.   To learn more about what you can do with MyChart, go to [email or web address].    Your next appointment:   1 year(s)  The format for your next appointment:   In Person  Provider:   Gayatri A Acharya, MD     Other Instructions   Important Information About Sugar       

## 2021-12-11 DIAGNOSIS — H34811 Central retinal vein occlusion, right eye, with macular edema: Secondary | ICD-10-CM | POA: Diagnosis not present

## 2021-12-11 DIAGNOSIS — H43812 Vitreous degeneration, left eye: Secondary | ICD-10-CM | POA: Diagnosis not present

## 2021-12-11 DIAGNOSIS — H35362 Drusen (degenerative) of macula, left eye: Secondary | ICD-10-CM | POA: Diagnosis not present

## 2021-12-11 DIAGNOSIS — H35371 Puckering of macula, right eye: Secondary | ICD-10-CM | POA: Diagnosis not present

## 2022-01-05 ENCOUNTER — Other Ambulatory Visit: Payer: Self-pay | Admitting: Internal Medicine

## 2022-01-05 DIAGNOSIS — R202 Paresthesia of skin: Secondary | ICD-10-CM

## 2022-01-08 ENCOUNTER — Encounter: Payer: Self-pay | Admitting: Internal Medicine

## 2022-02-12 IMAGING — CT CT ANGIO NECK
1 of 11 series · 6 of 33 positions shown · IV contrast (omnipaque)
Comparison: None.

CLINICAL DATA: Dizziness and vision changes

EXAM:
CT ANGIOGRAPHY HEAD AND NECK
TECHNIQUE: Multidetector CT imaging of the head and neck was performed using
the standard protocol during bolus administration of intravenous
contrast. Multiplanar CT image reconstructions and MIPs were
obtained to evaluate the vascular anatomy. Carotid stenosis
measurements (when applicable) are obtained utilizing NASCET
criteria, using the distal internal carotid diameter as the
denominator.
CONTRAST:  100mL OMNIPAQUE IOHEXOL 350 MG/ML SOLN

[Series 12: axial thin · axial · 0.49mm/px · z∈[-347,-89]mm · 6 of 370 slices shown]
[im 53/370  soft-tissue]
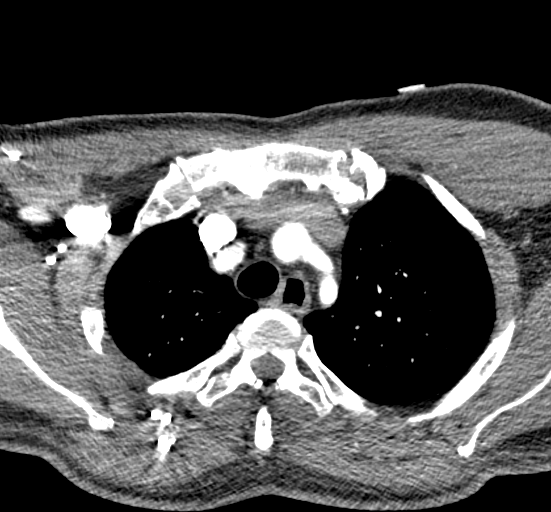
[im 106/370  bone]
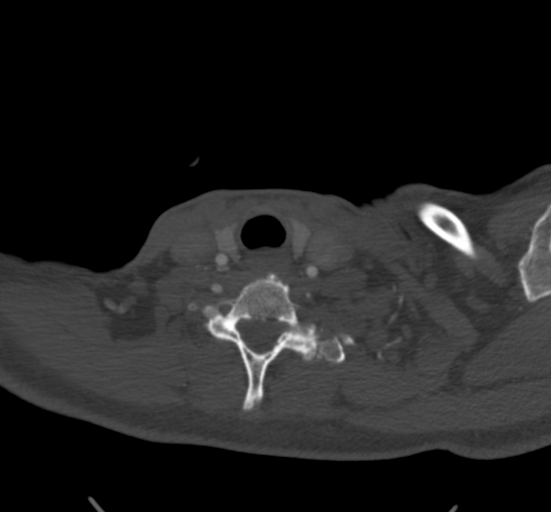
[im 159/370  soft-tissue]
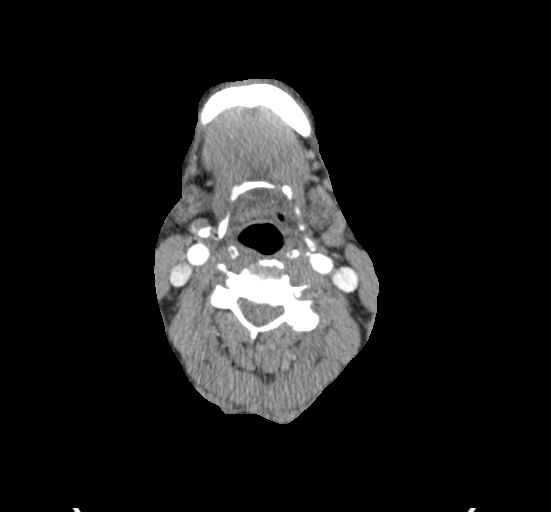
[im 211/370  bone]
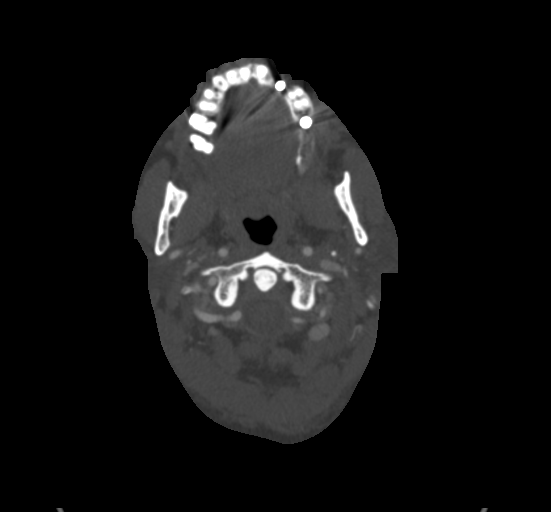
[im 264/370  soft-tissue]
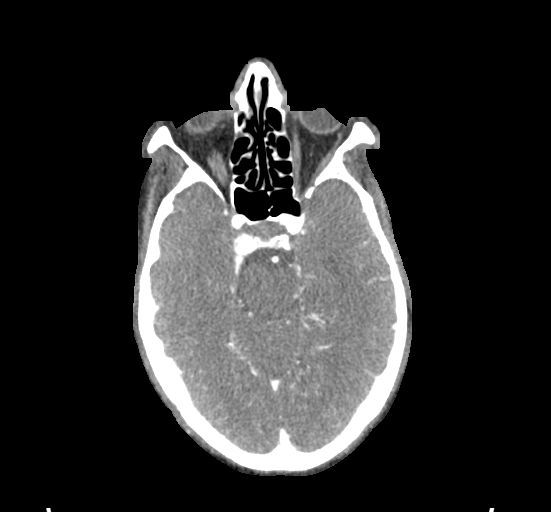
[im 317/370  bone]
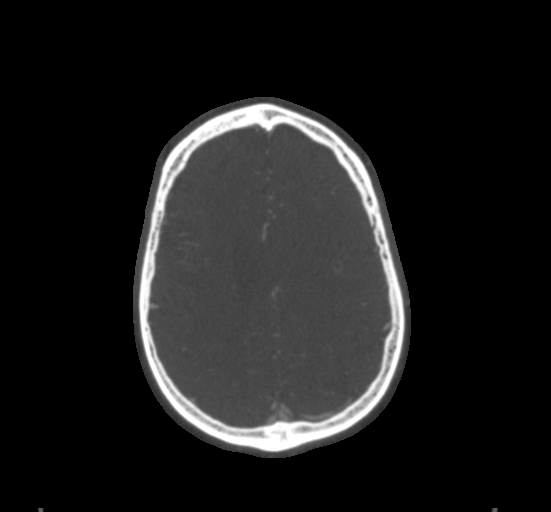

[6 of 33 positions shown; findings below may reference images not displayed]

FINDINGS: CT HEAD

Brain: There is no acute intracranial hemorrhage, mass effect, or
edema. Gray-white differentiation is preserved. There is no
extra-axial fluid collection. Prominence of the ventricles and sulci
reflects minor generalized parenchymal volume loss. Patchy
hypoattenuation in the supratentorial white matter is nonspecific
but may reflect mild to moderate chronic microvascular ischemic
changes.

Vascular: No hyperdense vessel or unexpected calcification.

Skull: Calvarium is unremarkable.

Sinuses/Orbits: Chronic right maxillary sinusitis. Bilateral lens
replacements.

Other: None.

Review of the MIP images confirms the above findings

CTA NECK

Aortic arch: Great vessel origins are patent.

Right carotid system: Patent. Trace calcified plaque at the ICA
origin without measurable stenosis.

Left carotid system: Patent. Mild noncalcified and calcified plaque
at the ICA origin causing minimal stenosis.

Vertebral arteries: Right vertebral artery is patent and dominant.
Left vertebral artery is patent.

Skeleton: Degenerative changes of the cervical spine are greatest at
C5-C6.

Other neck: No mass or adenopathy.

Upper chest: No apical lung mass.

Review of the MIP images confirms the above findings

CTA HEAD

Anterior circulation: Intracranial internal carotid arteries are
patent. Anterior cerebral arteries are patent. Left A1 ACA is
dominant. Middle cerebral arteries are patent.

Posterior circulation: Intracranial vertebral arteries and PICA
origins are patent. Basilar artery is patent. Superior cerebellar
artery origins are patent. Posterior cerebral arteries are patent.
There is near fetal origin of the right PCA.

Venous sinuses: Patent as allowed by contrast bolus timing.

Review of the MIP images confirms the above findings
IMPRESSION: No acute intracranial abnormality. Mild to moderate chronic
microvascular ischemic changes.

There is no large vessel occlusion, hemodynamically significant
stenosis, or evidence of dissection.

## 2022-02-26 DIAGNOSIS — H34811 Central retinal vein occlusion, right eye, with macular edema: Secondary | ICD-10-CM | POA: Diagnosis not present

## 2022-02-26 DIAGNOSIS — H35362 Drusen (degenerative) of macula, left eye: Secondary | ICD-10-CM | POA: Diagnosis not present

## 2022-02-26 DIAGNOSIS — H43813 Vitreous degeneration, bilateral: Secondary | ICD-10-CM | POA: Diagnosis not present

## 2022-02-26 DIAGNOSIS — H35371 Puckering of macula, right eye: Secondary | ICD-10-CM | POA: Diagnosis not present

## 2022-03-01 DIAGNOSIS — R1084 Generalized abdominal pain: Secondary | ICD-10-CM | POA: Diagnosis not present

## 2022-03-01 DIAGNOSIS — R1031 Right lower quadrant pain: Secondary | ICD-10-CM | POA: Diagnosis not present

## 2022-04-23 ENCOUNTER — Other Ambulatory Visit: Payer: Self-pay | Admitting: Internal Medicine

## 2022-05-13 ENCOUNTER — Other Ambulatory Visit: Payer: Self-pay

## 2022-05-13 MED ORDER — ATORVASTATIN CALCIUM 40 MG PO TABS: 40.0000 mg | ORAL_TABLET | Freq: Every day | ORAL | 1 refills | Status: DC

## 2022-08-05 DIAGNOSIS — H35033 Hypertensive retinopathy, bilateral: Secondary | ICD-10-CM | POA: Diagnosis not present

## 2022-08-05 DIAGNOSIS — H43813 Vitreous degeneration, bilateral: Secondary | ICD-10-CM | POA: Diagnosis not present

## 2022-08-05 DIAGNOSIS — H35371 Puckering of macula, right eye: Secondary | ICD-10-CM | POA: Diagnosis not present

## 2022-08-05 DIAGNOSIS — H34811 Central retinal vein occlusion, right eye, with macular edema: Secondary | ICD-10-CM | POA: Diagnosis not present

## 2022-08-09 ENCOUNTER — Telehealth: Payer: Self-pay

## 2022-08-09 ENCOUNTER — Ambulatory Visit (INDEPENDENT_AMBULATORY_CARE_PROVIDER_SITE_OTHER): Payer: 59

## 2022-08-09 VITALS — Ht 72.0 in | Wt 185.0 lb

## 2022-08-09 DIAGNOSIS — Z0000 Encounter for general adult medical examination without abnormal findings: Secondary | ICD-10-CM | POA: Diagnosis not present

## 2022-08-09 NOTE — Progress Notes (Signed)
I connected with  Fernando Saunders on 08/09/22 by a audio enabled telemedicine application and verified that I am speaking with the correct person using two identifiers.  Patient Location: Home  Provider Location: Office/Clinic  I discussed the limitations of evaluation and management by telemedicine. The patient expressed understanding and agreed to proceed.  Subjective:   Fernando Saunders is a 82 y.o. male who presents for Medicare Annual/Subsequent preventive examination.  Review of Systems     Cardiac Risk Factors include: advanced age (>78mn, >>24women);dyslipidemia;hypertension;male gender     Objective:    Today's Vitals   08/09/22 1604  Weight: 185 lb (83.9 kg)  Height: 6' (1.829 m)  PainSc: 0-No pain   Body mass index is 25.09 kg/m.     08/09/2022    4:17 PM 07/16/2021   10:15 AM 01/28/2020   12:43 PM 01/11/2017    2:00 PM  Advanced Directives  Does Patient Have a Medical Advance Directive? No No No No  Would patient like information on creating a medical advance directive? No - Patient declined No - Patient declined      Current Medications (verified) Outpatient Encounter Medications as of 08/09/2022  Medication Sig   aspirin EC 81 MG tablet Take 1 tablet (81 mg total) by mouth daily. Swallow whole.   atorvastatin (LIPITOR) 40 MG tablet Take 1 tablet (40 mg total) by mouth daily. Please contact the office to schedule appointment for additional refills.   celecoxib (CELEBREX) 100 MG capsule Take 1 capsule (100 mg total) by mouth 2 (two) times daily.   gabapentin (NEURONTIN) 100 MG capsule Take 1 capsule by mouth at bedtime   GEMTESA 75 MG TABS Take 1 tablet by mouth daily.   lisinopril (ZESTRIL) 10 MG tablet Take 1 tablet by mouth once daily   Multiple Vitamin (MULTIVITAMIN) tablet Take 1 tablet by mouth daily.   MYRBETRIQ 50 MG TB24 tablet Take 1 tablet by mouth daily.   omega-3 acid ethyl esters (LOVAZA) 1 g capsule Take by mouth as needed.   omeprazole (PRILOSEC)  40 MG capsule Take 1 capsule (40 mg total) by mouth daily.   tamsulosin (FLOMAX) 0.4 MG CAPS capsule Take 0.4 mg by mouth daily.   triamcinolone cream (KENALOG) 0.1 % Apply 1 application topically daily as needed.   No facility-administered encounter medications on file as of 08/09/2022.    Allergies (verified) Patient has no known allergies.   History: Past Medical History:  Diagnosis Date   Arthritis    Diverticulosis    GERD (gastroesophageal reflux disease)    Hyperlipidemia    Past Surgical History:  Procedure Laterality Date   COLONOSCOPY  11/04/2011   Dr. CSilvano Rusk  EYE SURGERY     Family History  Problem Relation Age of Onset   Diabetes Mother    Cancer Sister    Colon cancer Neg Hx    Stomach cancer Neg Hx    Heart attack Neg Hx    Social History   Socioeconomic History   Marital status: Married    Spouse name: Not on file   Number of children: 2   Years of education: Not on file   Highest education level: Not on file  Occupational History   Occupation: Unemployed  Tobacco Use   Smoking status: Never   Smokeless tobacco: Never  Vaping Use   Vaping Use: Never used  Substance and Sexual Activity   Alcohol use: Yes    Alcohol/week: 0.0 standard drinks of alcohol  Comment: occasional   Drug use: No   Sexual activity: Not on file  Other Topics Concern   Not on file  Social History Narrative   Not on file   Social Determinants of Health   Financial Resource Strain: Low Risk  (08/09/2022)   Overall Financial Resource Strain (CARDIA)    Difficulty of Paying Living Expenses: Not hard at all  Food Insecurity: No Food Insecurity (08/09/2022)   Hunger Vital Sign    Worried About Running Out of Food in the Last Year: Never true    Ran Out of Food in the Last Year: Never true  Transportation Needs: No Transportation Needs (08/09/2022)   PRAPARE - Hydrologist (Medical): No    Lack of Transportation (Non-Medical): No  Physical  Activity: Sufficiently Active (08/09/2022)   Exercise Vital Sign    Days of Exercise per Week: 5 days    Minutes of Exercise per Session: 30 min  Stress: No Stress Concern Present (08/09/2022)   [place]    Feeling of Stress : Not at all  Social Connections: Unknown (08/09/2022)   Social Connection and Isolation Panel [NHANES]    Frequency of Communication with Friends and Family: More than three times a week    Frequency of Social Gatherings with Friends and Family: More than three times a week    Attends Religious Services: Patient refused    Marine scientist or Organizations: Patient refused    Attends Music therapist: Patient refused    Marital Status: Married    Tobacco Counseling Counseling given: Not Answered   Clinical Intake:  Pre-visit preparation completed: Yes  Pain : No/denies pain Pain Score: 0-No pain     BMI - recorded: 25.09 Nutritional Risks: None Diabetes: No  How often do you need to have someone help you when you read instructions, pamphlets, or other written materials from your doctor or pharmacy?: 1 - Never What is the last grade level you completed in school?: HSG/Some College/College Graduate  Diabetic? No  Interpreter Needed?: No  Information entered by :: Channie Bostick N. Demarie Uhlig, LPN.   Activities of Daily Living    08/09/2022    4:15 PM  In your present state of health, do you have any difficulty performing the following activities:  Hearing? 0  Vision? 0  Difficulty concentrating or making decisions? 0  Walking or climbing stairs? 0  Dressing or bathing? 0  Doing errands, shopping? 0  Preparing Food and eating ? N  Using the Toilet? N  In the past six months, have you accidently leaked urine? N  Do you have problems with loss of bowel control? N  Managing your Medications? N  Managing your Finances? N  Housekeeping or managing your Housekeeping? N     Patient Care Team: Janith Lima, MD as PCP - General (Internal Medicine) Vickie Epley, MD as PCP - Electrophysiology (Cardiology) Elouise Munroe, MD as PCP - Cardiology (Cardiology)  Indicate any recent Medical Services you may have received from other than [facility] providers in the past year (date may be approximate).     Assessment:   This is a routine wellness examination for Fernando Saunders.  Hearing/Vision screen Hearing Screening - Comments:: Denies hearing difficulties   Vision Screening - Comments:: Patient does not wear rx glasses due to lens implants - up to date with routine eye exams with Dr. Rutherford Guys   Dietary issues and  exercise activities discussed: Current Exercise Habits: Home exercise routine, Type of exercise: walking, Time (Minutes): 30, Frequency (Times/Week): 5, Weekly Exercise (Minutes/Week): 150, Intensity: Moderate, Exercise limited by: orthopedic condition(s)   Goals Addressed   None   Depression Screen    08/09/2022    4:17 PM 07/16/2021    9:57 AM 02/13/2021    4:20 PM 12/06/2019    2:38 PM 10/10/2014    9:25 AM 08/08/2014    9:54 AM  PHQ 2/9 Scores  PHQ - 2 Score 0 0 0 0 0 0    Fall Risk    08/09/2022    4:06 PM 07/16/2021    9:58 AM 02/13/2021    4:19 PM 12/06/2019    2:38 PM 01/11/2017    1:56 PM  [place] in the past year? 0 0 0 0 Yes  Number falls in past yr: 0 0   1  Injury with Fall? 0 0   Yes  Comment     neck pain  Risk Factor Category      High Fall Risk  Risk for fall due to : No Fall Risks No Fall Risks     Follow up Falls prevention discussed Falls evaluation completed   Falls evaluation completed    [place]:  Any stairs in or around the home? Yes  If so, are there any without handrails? No  Home free of loose throw rugs in walkways, pet beds, electrical cords, etc? Yes  Adequate lighting in your home to reduce risk of falls? Yes   ASSISTIVE DEVICES UTILIZED TO PREVENT  FALLS:  Life alert? No  Use of a cane, walker or w/c? No  Grab bars in the bathroom? Yes  Shower chair or bench in shower? Yes  Elevated toilet seat or a handicapped toilet? Yes   TIMED UP AND GO:  Was the test performed? No . Phone Visit   Cognitive Function:        08/09/2022    4:15 PM  6CIT Screen  What Year? 0 points  What month? 0 points  What time? 0 points  Count back from 20 0 points  Months in reverse 0 points  Repeat phrase 0 points  Total Score 0 points    Immunizations Immunization History  Administered Date(s) Administered   Influenza,inj,Quad PF,6+ Mos 08/08/2014   Pneumococcal Conjugate-13 11/12/2013, 08/30/2015   Pneumococcal Polysaccharide-23 08/07/2010, 02/13/2021   Tdap 08/07/2010, 02/16/2021    TDAP status: Up to date  Flu Vaccine status: Due, Education has been provided regarding the importance of this vaccine. Advised may receive this vaccine at local pharmacy or Health Dept. Aware to provide a copy of the vaccination record if obtained from local pharmacy or Health Dept. Verbalized acceptance and understanding.  Pneumococcal vaccine status: Up to date  Covid-19 vaccine status: Completed vaccines per patient; need documentation  Qualifies for Shingles Vaccine? Yes   Zostavax completed No   Shingrix Completed?: No.    Education has been provided regarding the importance of this vaccine. Patient has been advised to call insurance company to determine out of pocket expense if they have not yet received this vaccine. Advised may also receive vaccine at local pharmacy or Health Dept. Verbalized acceptance and understanding.  Screening Tests Health Maintenance  Topic Date Due   COVID-19 Vaccine (1) Never done   Hepatitis C Screening  Never done   Zoster Vaccines- Shingrix (1 of 2) Never done   INFLUENZA  VACCINE  01/29/2022   Medicare Annual Wellness (AWV)  08/10/2023   DTaP/Tdap/Td (3 - Td or Tdap) 02/17/2031   Pneumonia Vaccine 90+ Years old   Completed   HPV VACCINES  Aged Out   COLONOSCOPY (Pts 45-63yr Insurance coverage will need to be confirmed)  Discontinued    Health Maintenance  Health Maintenance Due  Topic Date Due   COVID-19 Vaccine (1) Never done   Hepatitis C Screening  Never done   Zoster Vaccines- Shingrix (1 of 2) Never done   INFLUENZA VACCINE  01/29/2022    Colorectal cancer screening: No longer required.   Lung Cancer Screening: (Low Dose CT Chest recommended if Age 80-80years, 30 pack-year currently smoking OR have quit w/in 15years.) does not qualify.   Lung Cancer Screening Referral: no  Additional Screening:  Hepatitis C Screening: does qualify; Completed no  Vision Screening: Recommended annual ophthalmology exams for early detection of glaucoma and other disorders of the eye. Is the patient up to date with their annual eye exam?  Yes  Who is the provider or what is the name of the office in which the patient attends annual eye exams? MRutherford Guys MD. If pt is not established with a provider, would they like to be referred to a provider to establish care? No .   Dental Screening: Recommended annual dental exams for proper oral hygiene  Community Resource Referral / Chronic Care Management: CRR required this visit?  No   CCM required this visit?  No      Plan:     I have personally reviewed and noted the following in the patient's chart:   Medical and social history Use of alcohol, tobacco or illicit drugs  Current medications and supplements including opioid prescriptions. Patient is not currently taking opioid prescriptions. Functional ability and status Nutritional status Physical activity Advanced directives List of other physicians Hospitalizations, surgeries, and ER visits in previous 12 months Vitals Screenings to include cognitive, depression, and falls Referrals and appointments  In addition, I have reviewed and discussed with patient certain preventive protocols,  quality metrics, and best practice recommendations. A written personalized care plan for preventive services as well as general preventive health recommendations were provided to patient.     SSheral Flow LPN   [id number]  Nurse Notes: N/A

## 2022-08-09 NOTE — Patient Instructions (Signed)
Mr. Fernando Saunders , Thank you for taking time to come for your Medicare Wellness Visit. I appreciate your ongoing commitment to your health goals. Please review the following plan we discussed and let me know if I can assist you in the future.   These are the goals we discussed:  Goals       Patient Stated (pt-stated)      My goal is to stay healthy.        This is a list of the screening recommended for you and due dates:  Health Maintenance  Topic Date Due   COVID-19 Vaccine (1) Never done   Hepatitis C Screening: USPSTF Recommendation to screen - Ages 34-79 yo.  Never done   Zoster (Shingles) Vaccine (1 of 2) Never done   Flu Shot  01/29/2022   Medicare Annual Wellness Visit  08/10/2023   DTaP/Tdap/Td vaccine (3 - Td or Tdap) 02/17/2031   Pneumonia Vaccine  Completed   HPV Vaccine  Aged Out   Colon Cancer Screening  Discontinued    Advanced directives: No  Conditions/risks identified: Yes  Next appointment: Follow up in one year for your annual wellness visit.   Preventive Care 3 Years and Older, Male  Preventive care refers to lifestyle choices and visits with your health care provider that can promote health and wellness. What does preventive care include? A yearly physical exam. This is also called an annual well check. Dental exams once or twice a year. Routine eye exams. Ask your health care provider how often you should have your eyes checked. Personal lifestyle choices, including: Daily care of your teeth and gums. Regular physical activity. Eating a healthy diet. Avoiding tobacco and drug use. Limiting alcohol use. Practicing safe sex. Taking low doses of aspirin every day. Taking vitamin and mineral supplements as recommended by your health care provider. What happens during an annual well check? The services and screenings done by your health care provider during your annual well check will depend on your age, overall health, lifestyle risk factors, and family  history of disease. Counseling  Your health care provider may ask you questions about your: Alcohol use. Tobacco use. Drug use. Emotional well-being. Home and relationship well-being. Sexual activity. Eating habits. History of falls. Memory and ability to understand (cognition). Work and work Statistician. Screening  You may have the following tests or measurements: Height, weight, and BMI. Blood pressure. Lipid and cholesterol levels. These may be checked every 5 years, or more frequently if you are over 39 years old. Skin check. Lung cancer screening. You may have this screening every year starting at age 78 if you have a 30-pack-year history of smoking and currently smoke or have quit within the past 15 years. Fecal occult blood test (FOBT) of the stool. You may have this test every year starting at age 41. Flexible sigmoidoscopy or colonoscopy. You may have a sigmoidoscopy every 5 years or a colonoscopy every 10 years starting at age 37. Prostate cancer screening. Recommendations will vary depending on your family history and other risks. Hepatitis C blood test. Hepatitis B blood test. Sexually transmitted disease (STD) testing. Diabetes screening. This is done by checking your blood sugar (glucose) after you have not eaten for a while (fasting). You may have this done every 1-3 years. Abdominal aortic aneurysm (AAA) screening. You may need this if you are a current or former smoker. Osteoporosis. You may be screened starting at age 29 if you are at high risk. Talk with your health  care provider about your test results, treatment options, and if necessary, the need for more tests. Vaccines  Your health care provider may recommend certain vaccines, such as: Influenza vaccine. This is recommended every year. Tetanus, diphtheria, and acellular pertussis (Tdap, Td) vaccine. You may need a Td booster every 10 years. Zoster vaccine. You may need this after age 41. Pneumococcal  13-valent conjugate (PCV13) vaccine. One dose is recommended after age 57. Pneumococcal polysaccharide (PPSV23) vaccine. One dose is recommended after age 39. Talk to your health care provider about which screenings and vaccines you need and how often you need them. This information is not intended to replace advice given to you by your health care provider. Make sure you discuss any questions you have with your health care provider. Document Released: 07/14/2015 Document Revised: 03/06/2016 Document Reviewed: 04/18/2015 Elsevier Interactive Patient Education  2017 [employer] Prevention in the Home Falls can cause injuries. They can happen to people of all ages. There are many things you can do to make your home safe and to help prevent falls. What can I do on the outside of my home? Regularly fix the edges of walkways and driveways and fix any cracks. Remove anything that might make you trip as you walk through a door, such as a raised step or threshold. Trim any bushes or trees on the path to your home. Use bright outdoor lighting. Clear any walking paths of anything that might make someone trip, such as rocks or tools. Regularly check to see if handrails are loose or broken. Make sure that both sides of any steps have handrails. Any raised decks and porches should have guardrails on the edges. Have any leaves, snow, or ice cleared regularly. Use sand or salt on walking paths during winter. Clean up any spills in your garage right away. This includes oil or grease spills. What can I do in the bathroom? Use night lights. Install grab bars by the toilet and in the tub and shower. Do not use towel bars as grab bars. Use non-skid mats or decals in the tub or shower. If you need to sit down in the shower, use a plastic, non-slip stool. Keep the floor dry. Clean up any water that spills on the floor as soon as it happens. Remove soap buildup in the tub or shower regularly. Attach  bath mats securely with double-sided non-slip rug tape. Do not have throw rugs and other things on the floor that can make you trip. What can I do in the bedroom? Use night lights. Make sure that you have a light by your bed that is easy to reach. Do not use any sheets or blankets that are too big for your bed. They should not hang down onto the floor. Have a firm chair that has side arms. You can use this for support while you get dressed. Do not have throw rugs and other things on the floor that can make you trip. What can I do in the kitchen? Clean up any spills right away. Avoid walking on wet floors. Keep items that you use a lot in easy-to-reach places. If you need to reach something above you, use a strong step stool that has a grab bar. Keep electrical cords out of the way. Do not use floor polish or wax that makes floors slippery. If you must use wax, use non-skid floor wax. Do not have throw rugs and other things on the floor that can make you trip. What can  I do with my stairs? Do not leave any items on the stairs. Make sure that there are handrails on both sides of the stairs and use them. Fix handrails that are broken or loose. Make sure that handrails are as long as the stairways. Check any carpeting to make sure that it is firmly attached to the stairs. Fix any carpet that is loose or worn. Avoid having throw rugs at the top or bottom of the stairs. If you do have throw rugs, attach them to the floor with carpet tape. Make sure that you have a light switch at the top of the stairs and the bottom of the stairs. If you do not have them, ask someone to add them for you. What else can I do to help prevent falls? Wear shoes that: Do not have high heels. Have rubber bottoms. Are comfortable and fit you well. Are closed at the toe. Do not wear sandals. If you use a stepladder: Make sure that it is fully opened. Do not climb a closed stepladder. Make sure that both sides of the  stepladder are locked into place. Ask someone to hold it for you, if possible. Clearly mark and make sure that you can see: Any grab bars or handrails. First and last steps. Where the edge of each step is. Use tools that help you move around (mobility aids) if they are needed. These include: Canes. Walkers. Scooters. Crutches. Turn on the lights when you go into a dark area. Replace any light bulbs as soon as they burn out. Set up your furniture so you have a clear path. Avoid moving your furniture around. If any of your floors are uneven, fix them. If there are any pets around you, be aware of where they are. Review your medicines with your doctor. Some medicines can make you feel dizzy. This can increase your chance of falling. Ask your doctor what other things that you can do to help prevent falls. This information is not intended to replace advice given to you by your health care provider. Make sure you discuss any questions you have with your health care provider. Document Released: 04/13/2009 Document Revised: 11/23/2015 Document Reviewed: 07/22/2014 Elsevier Interactive Patient Education  2017 [employer].

## 2022-08-09 NOTE — Telephone Encounter (Signed)
Please contact daughter, Prince Solian to schedule a CPE with Dr. Ronnald Ramp asap.  Check and see if Dr. Ronnald Ramp could possibly work him in one of his approval spots.

## 2022-08-15 ENCOUNTER — Ambulatory Visit (INDEPENDENT_AMBULATORY_CARE_PROVIDER_SITE_OTHER): Payer: 59 | Admitting: Internal Medicine

## 2022-08-15 ENCOUNTER — Ambulatory Visit (INDEPENDENT_AMBULATORY_CARE_PROVIDER_SITE_OTHER): Payer: 59

## 2022-08-15 ENCOUNTER — Encounter: Payer: Self-pay | Admitting: Internal Medicine

## 2022-08-15 VITALS — BP 120/60 | HR 61 | Temp 97.6°F | Ht 73.0 in | Wt 184.0 lb

## 2022-08-15 DIAGNOSIS — E785 Hyperlipidemia, unspecified: Secondary | ICD-10-CM | POA: Diagnosis not present

## 2022-08-15 DIAGNOSIS — R0789 Other chest pain: Secondary | ICD-10-CM

## 2022-08-15 DIAGNOSIS — R053 Chronic cough: Secondary | ICD-10-CM | POA: Diagnosis not present

## 2022-08-15 DIAGNOSIS — I10 Essential (primary) hypertension: Secondary | ICD-10-CM | POA: Diagnosis not present

## 2022-08-15 DIAGNOSIS — Z0001 Encounter for general adult medical examination with abnormal findings: Secondary | ICD-10-CM | POA: Diagnosis not present

## 2022-08-15 DIAGNOSIS — R918 Other nonspecific abnormal finding of lung field: Secondary | ICD-10-CM | POA: Diagnosis not present

## 2022-08-15 DIAGNOSIS — N400 Enlarged prostate without lower urinary tract symptoms: Secondary | ICD-10-CM | POA: Diagnosis not present

## 2022-08-15 DIAGNOSIS — D539 Nutritional anemia, unspecified: Secondary | ICD-10-CM | POA: Diagnosis not present

## 2022-08-15 DIAGNOSIS — Z23 Encounter for immunization: Secondary | ICD-10-CM

## 2022-08-15 DIAGNOSIS — J189 Pneumonia, unspecified organism: Secondary | ICD-10-CM

## 2022-08-15 DIAGNOSIS — Z1159 Encounter for screening for other viral diseases: Secondary | ICD-10-CM | POA: Diagnosis not present

## 2022-08-15 DIAGNOSIS — R059 Cough, unspecified: Secondary | ICD-10-CM | POA: Diagnosis not present

## 2022-08-15 DIAGNOSIS — R079 Chest pain, unspecified: Secondary | ICD-10-CM | POA: Diagnosis not present

## 2022-08-15 LAB — CBC WITH DIFFERENTIAL/PLATELET
Basophils Absolute: 0.1 10*3/uL (ref 0.0–0.1)
Basophils Relative: 0.7 % (ref 0.0–3.0)
Eosinophils Absolute: 0.2 10*3/uL (ref 0.0–0.7)
Eosinophils Relative: 2.1 % (ref 0.0–5.0)
HCT: 38.7 % — ABNORMAL LOW (ref 39.0–52.0)
Hemoglobin: 12.9 g/dL — ABNORMAL LOW (ref 13.0–17.0)
Lymphocytes Relative: 28.5 % (ref 12.0–46.0)
Lymphs Abs: 2.1 10*3/uL (ref 0.7–4.0)
MCHC: 33.3 g/dL (ref 30.0–36.0)
MCV: 96.5 fl (ref 78.0–100.0)
Monocytes Absolute: 0.7 10*3/uL (ref 0.1–1.0)
Monocytes Relative: 9 % (ref 3.0–12.0)
Neutro Abs: 4.3 10*3/uL (ref 1.4–7.7)
Neutrophils Relative %: 59.7 % (ref 43.0–77.0)
Platelets: 229 10*3/uL (ref 150.0–400.0)
RBC: 4.01 Mil/uL — ABNORMAL LOW (ref 4.22–5.81)
RDW: 12.2 % (ref 11.5–15.5)
WBC: 7.3 10*3/uL (ref 4.0–10.5)

## 2022-08-15 LAB — HEPATIC FUNCTION PANEL
ALT: 13 U/L (ref 0–53)
AST: 17 U/L (ref 0–37)
Albumin: 4.1 g/dL (ref 3.5–5.2)
Alkaline Phosphatase: 79 U/L (ref 39–117)
Bilirubin, Direct: 0.1 mg/dL (ref 0.0–0.3)
Total Bilirubin: 0.5 mg/dL (ref 0.2–1.2)
Total Protein: 7 g/dL (ref 6.0–8.3)

## 2022-08-15 LAB — LIPID PANEL
Cholesterol: 192 mg/dL (ref 0–200)
HDL: 32.6 mg/dL — ABNORMAL LOW (ref >39.00)
NonHDL: 158.9
Total CHOL/HDL Ratio: 6
Triglycerides: 275 mg/dL — ABNORMAL HIGH (ref 0.0–149.0)
VLDL: 55 mg/dL — ABNORMAL HIGH (ref 0.0–40.0)

## 2022-08-15 LAB — BRAIN NATRIURETIC PEPTIDE: Pro B Natriuretic peptide (BNP): 23 pg/mL (ref 0.0–100.0)

## 2022-08-15 LAB — PSA: PSA: 2.21 ng/mL (ref 0.10–4.00)

## 2022-08-15 LAB — TROPONIN I (HIGH SENSITIVITY): High Sens Troponin I: 7 ng/L (ref 2–17)

## 2022-08-15 LAB — TSH: TSH: 3.85 u[IU]/mL (ref 0.35–5.50)

## 2022-08-15 LAB — LDL CHOLESTEROL, DIRECT: Direct LDL: 110 mg/dL

## 2022-08-15 NOTE — Progress Notes (Signed)
Subjective:  Patient ID: Fernando Saunders, male    DOB: 1940/07/21  Age: 82 y.o. MRN: [id number]  CC: Annual Exam, Hypertension, Hyperlipidemia, and Cough    HPI Fernando Saunders presents for a CPX and f/up ------  It is difficult to get a history from him.  His daughter translates.  For several months he has had a sensation of chest heaviness at rest and with exercise and a nonproductive cough.  He complains of a gassy sensation in his stomach but denies abdominal pain, nausea, vomiting, diarrhea, melena, or bright red blood per rectum.  Outpatient Medications Prior to Visit  Medication Sig Dispense Refill   aspirin EC 81 MG tablet Take 1 tablet (81 mg total) by mouth daily. Swallow whole. 100 tablet 3   gabapentin (NEURONTIN) 100 MG capsule Take 1 capsule by mouth at bedtime 90 capsule 0   GEMTESA 75 MG TABS Take 1 tablet by mouth daily.     Multiple Vitamin (MULTIVITAMIN) tablet Take 1 tablet by mouth daily.     MYRBETRIQ 50 MG TB24 tablet Take 1 tablet by mouth daily.     omega-3 acid ethyl esters (LOVAZA) 1 g capsule Take by mouth as needed.     omeprazole (PRILOSEC) 40 MG capsule Take 1 capsule (40 mg total) by mouth daily. 30 capsule 3   tamsulosin (FLOMAX) 0.4 MG CAPS capsule Take 0.4 mg by mouth daily.     triamcinolone cream (KENALOG) 0.1 % Apply 1 application topically daily as needed. 453 g 2   atorvastatin (LIPITOR) 40 MG tablet Take 1 tablet (40 mg total) by mouth daily. Please contact the office to schedule appointment for additional refills. 90 tablet 1   celecoxib (CELEBREX) 100 MG capsule Take 1 capsule (100 mg total) by mouth 2 (two) times daily. 180 capsule 1   lisinopril (ZESTRIL) 10 MG tablet Take 1 tablet by mouth once daily 90 tablet 0   No facility-administered medications prior to visit.    ROS Review of Systems  Constitutional:  Negative for appetite change, chills, diaphoresis, fatigue, fever and unexpected weight change.  HENT: Negative.  Negative for sore  throat and trouble swallowing.   Eyes: Negative.   Respiratory:  Positive for cough. Negative for choking, chest tightness, shortness of breath, wheezing and stridor.   Cardiovascular:  Positive for chest pain. Negative for palpitations and leg swelling.  Gastrointestinal:  Negative for abdominal pain, diarrhea, nausea and vomiting.  Endocrine: Negative.   Genitourinary: Negative.  Negative for difficulty urinating, dysuria and hematuria.  Musculoskeletal: Negative.  Negative for arthralgias and myalgias.  Skin: Negative.  Negative for color change.  Neurological: Negative.  Negative for dizziness and weakness.  Hematological:  Negative for adenopathy. Does not bruise/bleed easily.  Psychiatric/Behavioral: Negative.      Objective:  BP 120/60 (BP Location: Left Arm, Patient Position: Sitting, Cuff Size: Normal)   Pulse 61   Temp 97.6 F (36.4 C) (Oral)   Ht 6' 1"$  (1.854 m)   Wt 184 lb (83.5 kg)   SpO2 96%   BMI 24.28 kg/m   BP Readings from Last 3 Encounters:  08/15/22 120/60  11/29/21 134/66  07/16/21 120/72    Wt Readings from Last 3 Encounters:  08/15/22 184 lb (83.5 kg)  08/09/22 185 lb (83.9 kg)  11/29/21 187 lb (84.8 kg)    Physical Exam Vitals reviewed.  Constitutional:      Appearance: Normal appearance.  HENT:     Nose: Nose normal.     Mouth/Throat:  Mouth: Mucous membranes are moist.  Eyes:     General: No scleral icterus.    Conjunctiva/sclera: Conjunctivae normal.  Cardiovascular:     Rate and Rhythm: Regular rhythm. Bradycardia present.     Heart sounds: No murmur heard.    No gallop.     Comments: EKG- SB, 54 bpm No LVH or Q waves Pulmonary:     Effort: Pulmonary effort is normal.     Breath sounds: No stridor. No wheezing, rhonchi or rales.  Abdominal:     General: Abdomen is flat.     Palpations: There is no mass.     Tenderness: There is no abdominal tenderness. There is no guarding.     Hernia: No hernia is present.   Musculoskeletal:        General: Normal range of motion.     Cervical back: Neck supple.     Right lower leg: No edema.     Left lower leg: No edema.  Lymphadenopathy:     Cervical: No cervical adenopathy.  Skin:    General: Skin is warm and dry.     Findings: No rash.  Neurological:     General: No focal deficit present.     Mental Status: He is alert. Mental status is at baseline.  Psychiatric:        Mood and Affect: Mood normal.        Behavior: Behavior normal.     Lab Results  Component Value Date   WBC 7.3 08/15/2022   HGB 12.9 (L) 08/15/2022   HCT 38.7 (L) 08/15/2022   PLT 229.0 08/15/2022   GLUCOSE 82 02/13/2021   CHOL 192 08/15/2022   TRIG 275.0 (H) 08/15/2022   HDL 32.60 (L) 08/15/2022   LDLDIRECT 110.0 08/15/2022   LDLCALC 45 02/13/2021   ALT 13 08/15/2022   AST 17 08/15/2022   NA 137 02/13/2021   K 4.2 02/13/2021   CL 103 02/13/2021   CREATININE 1.11 02/13/2021   BUN 18 02/13/2021   CO2 28 02/13/2021   TSH 3.85 08/15/2022   PSA 2.21 08/15/2022    CT CORONARY FRACTIONAL FLOW RESERVE DATA PREP  Result Date: 10/08/2020 EXAM: CT-FFR ANALYSIS CLINICAL DATA:  Possibly obstructive coronary lesion FINDINGS: CT-FFR analysis was performed on the original cardiac CT angiogram dataset. Diagrammatic representation of the CT-FFR analysis is provided in a separate PDF document in PACS. This dictation was created using the PDF document and an interactive 3D model of the results. 3D model is not available in the EMR/PACS. Normal FFR range is >0.80. 1. Left Main: No significant functional stenosis, CT-FFR 0.98 distal vessel 2. LAD: Possibly significant functional stenosis in the distal LAD CT-FFR 0.83. 3. LCX: No significant functional stenosis, CT-FFR 0.92 distal vessel. 4. RCA: No significant functional stenosis, CT-FFR 0.94 distal vessel. IMPRESSION: 1. CT FFR analysis shows borderline evidence of significant functional stenosis in the distal LAD. Rudean Haskell MD  Electronically Signed   By: Rudean Haskell MD   On: 10/08/2020 15:09   CT CORONARY MORPH W/CTA COR W/SCORE Lewanda Rife W/CM &/OR WO/CM  Addendum Date: 10/06/2020   ADDENDUM REPORT: 10/06/2020 11:35 CLINICAL DATA:  82 Year old Hispanic Male EXAM: Cardiac/Coronary  CTA TECHNIQUE: The patient was scanned on a [employer]. FINDINGS: A 100 kV prospective scan was triggered in the descending thoracic aorta at 111 HU's. Axial non-contrast 3 mm slices were carried out through the heart. The data set was analyzed on a dedicated work station and scored  using the Agatson method. Gantry rotation speed was 250 msecs and collimation was .6 mm. No beta blockade and 0.8 mg of sl NTG was given. The 3D data set was reconstructed in 5% intervals of the 67-82 % of the R-R cycle. Diastolic phases were analyzed on a dedicated work station using MPR, MIP and VRT modes. The patient received 90 cc of contrast. Aorta: Normal size. Ascending aortic atherosclerosis noted. No dissection. Aortic Valve:  Tri-leaflet.  Minimal annular calcification. Coronary Arteries:  Normal coronary origin.  Right dominance. Coronary Calcium Score: Left main: 44 Left anterior descending artery: 130 Left circumflex artery: 0 Right coronary artery: 0 Total: 174 Percentile: 58th for age, sex, and race matched control. RCA is a large dominant artery that gives rise to PDA and PLA. There is no plaque. Left main is a large artery that gives rise to LAD and LCX arteries. There is a distal calcific plaque < 10%. LAD is a large vessel that gives rise to one large D1 Branch and a smaller D2 vessel. There is a mild non obstructive (25-49%) calcific plaque in the ostial vessel. There is a mild non obstructive (25-49%) calcific plaque in the proximal vessel. There is a moderate stenosis (50-70%) calcific plaque in the mid vessel. LCX is a non-dominant artery that gives rise to two OM vessels. There is no plaque. Other findings: Normal pulmonary vein drainage  into the left atrium. Normal left atrial appendage without a thrombus. Normal size of the pulmonary artery. Cannot rule out small patent foramen ovale. Extra-cardiac findings: See attached radiology report for non-cardiac structures. IMPRESSION: 1. Coronary calcium score of 174. This was 58th percentile for age, sex, and race matched control. 2. Normal coronary origin with Right dominance. 3. CAD-RADS 3. Moderate stenosis in LAD system. Consider symptom-guided anti-ischemic pharmacotherapy as well as risk factor modification per guideline directed care. Additional analysis with CT FFR will be submitted. 4. Ascending aortic atherosclerosis noted. 5. Cannot rule out small patent foramen ovale. RECOMMENDATIONS: Coronary artery calcium (CAC) score is a strong predictor of incident coronary heart disease (CHD) and provides predictive information beyond traditional risk factors. CAC scoring is reasonable to use in the decision to withhold, postpone, or initiate statin therapy in intermediate-risk or selected borderline-risk asymptomatic adults (age 34-75 years and LDL-C ?70 to <190 mg/dL) who do not have diabetes or established atherosclerotic cardiovascular disease (ASCVD).* In intermediate-risk (10-year ASCVD risk ?7.5% to <20%) adults or selected borderline-risk (10-year ASCVD risk ?5% to <7.5%) adults in whom a CAC score is measured for the purpose of making a treatment decision the following recommendations have been made: If CAC = 0, it is reasonable to withhold statin therapy and reassess in 5 to 10 years, as long as higher risk conditions are absent (diabetes mellitus, family history of premature CHD in first degree relatives (males <55 years; females <65 years), cigarette smoking, LDL ?190 mg/dL or other independent risk factors). If CAC is 1 to 99, it is reasonable to initiate statin therapy for patients ?80 years of age. If CAC is ?100 or ?75th percentile, it is reasonable to initiate statin therapy at any age.  Cardiology referral should be considered for patients with CAC scores ?400 or ?75th percentile. *2018 AHA/ACC/AACVPR/AAPA/ABC/ACPM/ADA/AGS/APhA/ASPC/NLA/PCNA Guideline on the Management of Blood Cholesterol: A Report of the American College of Cardiology/American Heart Association Task Force on Clinical Practice Guidelines. J Am Coll Cardiol. 2019;73(24):3168-3209. Rudean Haskell, MD Electronically Signed   By: Rudean Haskell MD   On: 10/06/2020 11:35  Result Date: 10/06/2020 EXAM: OVER-READ INTERPRETATION  CT CHEST The following report is an over-read performed by radiologist Dr. Vinnie Langton of [facility], [place] on 10/06/2020. This over-read does not include interpretation of cardiac or coronary anatomy or pathology. The coronary calcium score/coronary CTA interpretation by the cardiologist is attached. COMPARISON:  None. FINDINGS: Aortic atherosclerosis. Within the visualized portions of the thorax there are no suspicious appearing pulmonary nodules or masses, there is no acute consolidative airspace disease, no pleural effusions, no pneumothorax and no lymphadenopathy. Visualized portions of the upper abdomen are unremarkable. There are no aggressive appearing lytic or blastic lesions noted in the visualized portions of the skeleton. IMPRESSION: 1.  Aortic Atherosclerosis (ICD10-I70.0). Electronically Signed: By: Vinnie Langton M.D. On: 10/06/2020 08:18   DG Chest 2 View  Result Date: 08/15/2022 CLINICAL DATA:  Provided history: Cough. Chest pain. EXAM: CHEST - 2 VIEW COMPARISON:  Prior chest radiographs 08/08/2014 and earlier. FINDINGS: Heart size within normal limits. Ill-defined opacities within the medial left lung base. No appreciable airspace consolidation on the right. No evidence of pleural effusion or pneumothorax. No acute osseous abnormality identified. These results will be called to the ordering clinician or representative by the Radiologist Assistant, and communication  documented in the PACS or [employer]. IMPRESSION: Ill-defined opacities within medial left lung base, suspicious for pneumonia given the provided history. Followup PA and lateral chest radiographs are recommended in 3-4 weeks following a trial of antibiotic therapy to ensure resolution and exclude underlying malignancy. Electronically Signed   By: Kellie Simmering D.O.   On: 08/15/2022 10:30      Assessment & Plan:   Fernando Saunders was seen today for annual exam, hypertension, hyperlipidemia and cough.  Diagnoses and all orders for this visit:  Essential hypertension- His blood pressure is adequately well-controlled.  EKG is negative for LVH. -     CBC with Differential/Platelet; Future -     TSH; Future -     Urinalysis, Routine w reflex microscopic; Future -     Hepatic function panel; Future -     EKG 12-Lead -     Hepatic function panel -     Urinalysis, Routine w reflex microscopic -     TSH -     CBC with Differential/Platelet  Benign prostatic hyperplasia without lower urinary tract symptoms- His PSA is normal. -     Urinalysis, Routine w reflex microscopic; Future -     PSA; Future -     PSA -     Urinalysis, Routine w reflex microscopic  Hyperlipidemia with target LDL less than 100- He has not achieved his LDL goal.  I have asked asked him to start taking the statin. -     Lipid panel; Future -     TSH; Future -     Hepatic function panel; Future -     Hepatic function panel -     TSH -     Lipid panel -     atorvastatin (LIPITOR) 40 MG tablet; Take 1 tablet (40 mg total) by mouth daily.  Encounter for general adult medical examination with abnormal findings- Exam completed, labs reviewed, vaccines reviewed and updated, cancer screenings addressed, patient education was given.  Chest heaviness- Chest x-ray is suspicious for left lower lobe pneumonia.  His symptoms are atypical so I have ordered a CT with contrast to rule out malignancy.  Troponin and BNP are normal.  EKG is  reassuring. -     DG Chest 2  View; Future -     EKG 12-Lead -     Brain natriuretic peptide; Future -     Troponin I (High Sensitivity); Future -     Troponin I (High Sensitivity) -     Brain natriuretic peptide -     CT Chest W Contrast; Future  Chronic cough- He has left lower lobe opacities. -     DG Chest 2 View; Future -     CT Chest W Contrast; Future  Need for hepatitis C screening test -     Hepatitis C antibody; Future -     Hepatitis C antibody  Opacity of lung on imaging study- Will evaluate for malignancies and will empirically treat for left lower lobe pneumonia with Bactrim. -     CT Chest W Contrast; Future  Abnormal findings on diagnostic imaging of lung -     CT Chest W Contrast; Future  Deficiency anemia- I have asked him to come back to be evaluated for vitamin deficiencies. -     Reticulocytes; Future -     Vitamin B12; Future -     Folate; Future -     Vitamin B1; Future -     Zinc; Future -     IBC + Ferritin; Future  Pneumonia of left lower lobe due to infectious organism -     sulfamethoxazole-trimethoprim (BACTRIM DS) 800-160 MG tablet; Take 1 tablet by mouth 2 (two) times daily for 7 days.  Other orders -     LDL cholesterol, direct   I have discontinued Fernando Saunders's lisinopril and celecoxib. I have also changed his atorvastatin. Additionally, I am having him start on sulfamethoxazole-trimethoprim. Lastly, I am having him maintain his multivitamin, Myrbetriq, tamsulosin, omeprazole, omega-3 acid ethyl esters, triamcinolone cream, aspirin EC, Gemtesa, and gabapentin.  Meds ordered this encounter  Medications   atorvastatin (LIPITOR) 40 MG tablet    Sig: Take 1 tablet (40 mg total) by mouth daily.    Dispense:  90 tablet    Refill:  1   sulfamethoxazole-trimethoprim (BACTRIM DS) 800-160 MG tablet    Sig: Take 1 tablet by mouth 2 (two) times daily for 7 days.    Dispense:  14 tablet    Refill:  0   In addition to time spent on CPE, I spent  50 minutes in preparing to see the patient by review of recent labs and images, obtaining and reviewing separately obtained history, communicating with the patient and family, ordering medications and xrays , and documenting clinical information in the EHR including the differential Dx, treatment, and any further evaluation and management of multiple complex medical issues.     Follow-up: Return in about 6 months (around 02/13/2023).  Scarlette Calico, MD

## 2022-08-15 NOTE — Patient Instructions (Signed)
Health Maintenance, Male Adopting a healthy lifestyle and getting preventive care are important in promoting health and wellness. Ask your health care provider about: The right schedule for you to have regular tests and exams. Things you can do on your own to prevent diseases and keep yourself healthy. What should I know about diet, weight, and exercise? Eat a healthy diet  Eat a diet that includes plenty of vegetables, fruits, low-fat dairy products, and lean protein. Do not eat a lot of foods that are high in solid fats, added sugars, or sodium. Maintain a healthy weight Body mass index (BMI) is a measurement that can be used to identify possible weight problems. It estimates body fat based on height and weight. Your health care provider can help determine your BMI and help you achieve or maintain a healthy weight. Get regular exercise Get regular exercise. This is one of the most important things you can do for your health. Most adults should: Exercise for at least 150 minutes each week. The exercise should increase your heart rate and make you sweat (moderate-intensity exercise). Do strengthening exercises at least twice a week. This is in addition to the moderate-intensity exercise. Spend less time sitting. Even light physical activity can be beneficial. Watch cholesterol and blood lipids Have your blood tested for lipids and cholesterol at 80 years of age, then have this test every 5 years. You may need to have your cholesterol levels checked more often if: Your lipid or cholesterol levels are high. You are older than 80 years of age. You are at high risk for heart disease. What should I know about cancer screening? Many types of cancers can be detected early and may often be prevented. Depending on your health history and family history, you may need to have cancer screening at various ages. This may include screening for: Colorectal cancer. Prostate cancer. Skin cancer. Lung  cancer. What should I know about heart disease, diabetes, and high blood pressure? Blood pressure and heart disease High blood pressure causes heart disease and increases the risk of stroke. This is more likely to develop in people who have high blood pressure readings or are overweight. Talk with your health care provider about your target blood pressure readings. Have your blood pressure checked: Every 3-5 years if you are 18-39 years of age. Every year if you are 40 years old or older. If you are between the ages of 65 and 75 and are a current or former smoker, ask your health care provider if you should have a one-time screening for abdominal aortic aneurysm (AAA). Diabetes Have regular diabetes screenings. This checks your fasting blood sugar level. Have the screening done: Once every three years after age 45 if you are at a normal weight and have a low risk for diabetes. More often and at a younger age if you are overweight or have a high risk for diabetes. What should I know about preventing infection? Hepatitis B If you have a higher risk for hepatitis B, you should be screened for this virus. Talk with your health care provider to find out if you are at risk for hepatitis B infection. Hepatitis C Blood testing is recommended for: Everyone born from 1945 through 1965. Anyone with known risk factors for hepatitis C. Sexually transmitted infections (STIs) You should be screened each year for STIs, including gonorrhea and chlamydia, if: You are sexually active and are younger than 80 years of age. You are older than 80 years of age and your   health care provider tells you that you are at risk for this type of infection. Your sexual activity has changed since you were last screened, and you are at increased risk for chlamydia or gonorrhea. Ask your health care provider if you are at risk. Ask your health care provider about whether you are at high risk for HIV. Your health care provider  may recommend a prescription medicine to help prevent HIV infection. If you choose to take medicine to prevent HIV, you should first get tested for HIV. You should then be tested every 3 months for as long as you are taking the medicine. Follow these instructions at home: Alcohol use Do not drink alcohol if your health care provider tells you not to drink. If you drink alcohol: Limit how much you have to 0-2 drinks a day. Know how much alcohol is in your drink. In the [place] one drink equals one 12 oz bottle of beer (355 mL), one 5 oz glass of wine (148 mL), or one 1 oz glass of hard liquor (44 mL). Lifestyle Do not use any products that contain nicotine or tobacco. These products include cigarettes, chewing tobacco, and vaping devices, such as e-cigarettes. If you need help quitting, ask your health care provider. Do not use street drugs. Do not share needles. Ask your health care provider for help if you need support or information about quitting drugs. General instructions Schedule regular health, dental, and eye exams. Stay current with your vaccines. Tell your health care provider if: You often feel depressed. You have ever been abused or do not feel safe at home. Summary Adopting a healthy lifestyle and getting preventive care are important in promoting health and wellness. Follow your health care provider's instructions about healthy diet, exercising, and getting tested or screened for diseases. Follow your health care provider's instructions on monitoring your cholesterol and blood pressure. This information is not intended to replace advice given to you by your health care provider. Make sure you discuss any questions you have with your health care provider. Document Revised: 11/06/2020 Document Reviewed: 11/06/2020 Elsevier Patient Education  2023 Elsevier Inc.  

## 2022-08-16 ENCOUNTER — Other Ambulatory Visit (INDEPENDENT_AMBULATORY_CARE_PROVIDER_SITE_OTHER): Payer: 59

## 2022-08-16 DIAGNOSIS — D539 Nutritional anemia, unspecified: Secondary | ICD-10-CM | POA: Diagnosis not present

## 2022-08-16 DIAGNOSIS — J189 Pneumonia, unspecified organism: Secondary | ICD-10-CM | POA: Insufficient documentation

## 2022-08-16 LAB — URINALYSIS, ROUTINE W REFLEX MICROSCOPIC
Bilirubin Urine: NEGATIVE
Hgb urine dipstick: NEGATIVE
Ketones, ur: NEGATIVE
Leukocytes,Ua: NEGATIVE
Nitrite: NEGATIVE
Specific Gravity, Urine: 1.02 (ref 1.000–1.030)
Total Protein, Urine: NEGATIVE
Urine Glucose: NEGATIVE
Urobilinogen, UA: 0.2 (ref 0.0–1.0)
WBC, UA: NONE SEEN (ref 0–?)
pH: 6 (ref 5.0–8.0)

## 2022-08-16 LAB — HEPATITIS C ANTIBODY: Hepatitis C Ab: NONREACTIVE

## 2022-08-16 MED ORDER — ATORVASTATIN CALCIUM 40 MG PO TABS: 40.0000 mg | ORAL_TABLET | Freq: Every day | ORAL | 1 refills | Status: DC

## 2022-08-16 MED ORDER — SULFAMETHOXAZOLE-TRIMETHOPRIM 800-160 MG PO TABS: 1.0000 | ORAL_TABLET | Freq: Two times a day (BID) | ORAL | 0 refills | Status: AC

## 2022-08-16 MED ORDER — SHINGRIX 50 MCG/0.5ML IM SUSR: 0.5000 mL | Freq: Once | INTRAMUSCULAR | 1 refills | Status: AC

## 2022-08-19 ENCOUNTER — Encounter: Payer: Self-pay | Admitting: Internal Medicine

## 2022-08-19 LAB — VITAMIN B12: Vitamin B-12: 395 pg/mL (ref 211–911)

## 2022-08-19 LAB — IBC + FERRITIN
Ferritin: 118.8 ng/mL (ref 22.0–322.0)
Iron: 98 ug/dL (ref 42–165)
Saturation Ratios: 33.7 % (ref 20.0–50.0)
TIBC: 291.2 ug/dL (ref 250.0–450.0)
Transferrin: 208 mg/dL — ABNORMAL LOW (ref 212.0–360.0)

## 2022-08-19 LAB — FOLATE: Folate: 13.5 ng/mL (ref >5.9)

## 2022-08-20 ENCOUNTER — Telehealth: Payer: Self-pay | Admitting: Internal Medicine

## 2022-08-20 NOTE — Telephone Encounter (Signed)
Pt daughter called wanted to know if Dr. Ronnald Ramp  can send a referral to another location because the first location don't have an openings until end of march.  Please call back pt daughter with update  [phone]

## 2022-08-21 NOTE — Telephone Encounter (Signed)
Pt's daughter Prince Solian has been informed that imaging locations are booked out but she can call to see if it is any cancellations to have the appointment moved up.

## 2022-09-02 LAB — RETICULOCYTES
ABS Retic: 70040 cells/uL (ref 25000–90000)
Retic Ct Pct: 1.7 %

## 2022-09-02 LAB — VITAMIN B1: Vitamin B1 (Thiamine): 14 nmol/L (ref 8–30)

## 2022-09-02 LAB — ZINC: Zinc: 69 ug/dL (ref 60–130)

## 2022-09-06 DIAGNOSIS — H34811 Central retinal vein occlusion, right eye, with macular edema: Secondary | ICD-10-CM | POA: Diagnosis not present

## 2022-09-16 ENCOUNTER — Ambulatory Visit
Admission: RE | Admit: 2022-09-16 | Discharge: 2022-09-16 | Disposition: A | Discharge status: Home / Self Care | Payer: 59 | Source: Ambulatory Visit | Attending: Internal Medicine | Admitting: Internal Medicine

## 2022-09-16 ENCOUNTER — Telehealth: Payer: Self-pay | Admitting: Internal Medicine

## 2022-09-16 DIAGNOSIS — R918 Other nonspecific abnormal finding of lung field: Secondary | ICD-10-CM

## 2022-09-16 DIAGNOSIS — R911 Solitary pulmonary nodule: Secondary | ICD-10-CM | POA: Diagnosis not present

## 2022-09-16 DIAGNOSIS — I2510 Atherosclerotic heart disease of native coronary artery without angina pectoris: Secondary | ICD-10-CM | POA: Diagnosis not present

## 2022-09-16 DIAGNOSIS — R053 Chronic cough: Secondary | ICD-10-CM

## 2022-09-16 DIAGNOSIS — I700 Atherosclerosis of aorta: Secondary | ICD-10-CM | POA: Diagnosis not present

## 2022-09-16 DIAGNOSIS — R059 Cough, unspecified: Secondary | ICD-10-CM | POA: Diagnosis not present

## 2022-09-16 DIAGNOSIS — R0789 Other chest pain: Secondary | ICD-10-CM

## 2022-09-16 MED ORDER — IOPAMIDOL (ISOVUE-300) INJECTION 61%
75.0000 mL | Freq: Once | INTRAVENOUS | Status: AC | PRN
  Administered 2022-09-16: 75 mL via INTRAVENOUS

## 2022-09-16 NOTE — Telephone Encounter (Signed)
Pt's daughter reports some dizziness last night. When he was picking up something from the floor/bending over he felt off balance/dizzy- twice yesterday. Daughter says that it happens every so often. His BP was 116/57, hr 64; pt has not fallen She says the patient is drinking plenty of fluids and staying hydrated.   Recommended that when patient has to pick up something, to bend knees and reach down instead of bending head all the way down/over; and to do it slowly and to make sure he is staying well hydrated. I told her that I would also send this information to the provider to see if there are any other recommendations.

## 2022-09-16 NOTE — Telephone Encounter (Signed)
STAT if patient feels like he/she is going to faint   Are you dizzy now? Pt's daughter says he is in bed now, but last night he was dizzy. Patient is currently sleeping daughter states.   Do you feel faint or have you passed out? Last night patient felt like he was going to pass out.   Do you have any other symptoms? No   Have you checked your HR and BP (record if available)?  BP 116/57 HR 64

## 2022-09-16 NOTE — Progress Notes (Unsigned)
Cardiology Office Note:    Date:  09/18/2022   ID:  Fernando Saunders, DOB June 26, 1941, MRN [id number]  PCP:  Janith Lima, MD  Cardiologist:  Elouise Munroe, MD  Electrophysiologist:  Vickie Epley, MD   Referring MD: Janith Lima, MD   Chief Complaint: dizziness  History of Present Illness:    Fernando Saunders is a 82 y.o. male with a history of non-obstructive CAD on coronary CTA in 09/2020, dizziness, bradycardia, hyperlipidemia, GERD, and diverticulosis who is followed by Dr. Margaretann Loveless and presents today for evaluation of dizziness.   Patient has a history of resting sinus bradycardia and intermittent dizziness. Patient was seen in the ED in 12/2019 for dizziness and was noted to be bradycardic at that time with heart rates in the 40s. He was referred to Cardiology. He was seen by Dr. Margaretann Loveless in 01/2020 at which time he described pre-syncope and syncope with position changes concerning for orthostasis. Echo and 30 Day Event Monitor for further evaluation. Echo showed LVEF of 50-55% with no regional wall motion abnormalities and grade 1 diastolic dysfunction as well as mild MR. Monitor showed underlying sinus rhythm (min rate 41 bpm, max rate 146 bpm) and one 16 beat run of NSVT but no significant arrhythmias. She was referred to EP at the patient's request and seen by Dr. Quentin Ore but no PPM felt to be necessary at that time. At an office visit in 05/2020, patient reported some chest pain. ETT was ordered and was equivocal. Therefore, coronary CTA was ordered and showed a coronary calcium score of 174 (58th percentile for age and sex) and moderate CAD in the LAD system. FFR was negative.  Patient was last seen by Diona Browner, NP, in 11/2021 at which time he was doing well and denied any dizziness. His biggest complaint was some right arm pain with associated numbness/ tingling. He was being followed by Ortho for this as it was felt to be due to arthritis.   Patient's daughter called our  office on 08/18/2022 with intermittent dizziness. Therefore, this office visit was arranged for further evaluation. He presents today with his daughter. He states he has been having intermittent dizziness for a couple of years. He notices this mostly after bending over and then standing up. However, he was walking a few days ago and started having dizziness and felt like he could not walk straight. These dizzy episodes are very brief and only last for about 1 minute. He likes to garden so he has frequent episodes with this. Daughter thinks it is due to a low heart rate. He reports occasional heaviness in his chest that feels like "gasses"  as well as some pain down his left arm. However, this is not new. He states he has been having this for a while and it is essentially unchanged from when he had his coronary CTA in 09/2020. He initially said it was worse with exertion but then when I asked him to elaborate on this he just went back to his dizziness when he bends over. Patient thinks the chest discomfort is more due to reflux and feels like "gas." He takes Omeprazole but it does not seem to help. He has seen GI in the past. His daughter states he breathes "heavier" when he is exerting himself but no real shortness of breath. No orthopnea, PND, or edema.   Past Medical History:  Diagnosis Date   Arthritis    Diverticulosis    GERD (gastroesophageal reflux disease)  Hyperlipidemia     Past Surgical History:  Procedure Laterality Date   COLONOSCOPY  11/04/2011   Dr. Silvano Rusk   EYE SURGERY      Current Medications: Current Meds  Medication Sig   aspirin EC 81 MG tablet Take 1 tablet (81 mg total) by mouth daily. Swallow whole.   atorvastatin (LIPITOR) 80 MG tablet Take 1 tablet (80 mg total) by mouth daily.   gabapentin (NEURONTIN) 100 MG capsule Take 1 capsule by mouth at bedtime   GEMTESA 75 MG TABS Take 1 tablet by mouth daily.   Multiple Vitamin (MULTIVITAMIN) tablet Take 1 tablet by mouth  daily.   MYRBETRIQ 50 MG TB24 tablet Take 1 tablet by mouth daily.   omega-3 acid ethyl esters (LOVAZA) 1 g capsule Take by mouth as needed.   pantoprazole (PROTONIX) 40 MG tablet Take 1 tablet (40 mg total) by mouth daily.   tamsulosin (FLOMAX) 0.4 MG CAPS capsule Take 0.4 mg by mouth daily.   triamcinolone cream (KENALOG) 0.1 % Apply 1 application topically daily as needed.   [DISCONTINUED] atorvastatin (LIPITOR) 40 MG tablet Take 1 tablet (40 mg total) by mouth daily.   [DISCONTINUED] omeprazole (PRILOSEC) 40 MG capsule Take 1 capsule (40 mg total) by mouth daily.     Allergies:   Patient has no known allergies.   Social History   Socioeconomic History   Marital status: Married    Spouse name: Not on file   Number of children: 2   Years of education: Not on file   Highest education level: Not on file  Occupational History   Occupation: Unemployed  Tobacco Use   Smoking status: Never   Smokeless tobacco: Never  Vaping Use   Vaping Use: Never used  Substance and Sexual Activity   Alcohol use: Yes    Alcohol/week: 0.0 standard drinks of alcohol    Comment: occasional   Drug use: No   Sexual activity: Not on file  Other Topics Concern   Not on file  Social History Narrative   Not on file   Social Determinants of Health   Financial Resource Strain: Low Risk  (08/09/2022)   Overall Financial Resource Strain (CARDIA)    Difficulty of Paying Living Expenses: Not hard at all  Food Insecurity: No Food Insecurity (08/09/2022)   Hunger Vital Sign    Worried About Running Out of Food in the Last Year: Never true    Ran Out of Food in the Last Year: Never true  Transportation Needs: No Transportation Needs (08/09/2022)   PRAPARE - Hydrologist (Medical): No    Lack of Transportation (Non-Medical): No  Physical Activity: Sufficiently Active (08/09/2022)   Exercise Vital Sign    Days of Exercise per Week: 5 days    Minutes of Exercise per Session: 30 min   Stress: No Stress Concern Present (08/09/2022)   [place]    Feeling of Stress : Not at all  Social Connections: Unknown (08/09/2022)   Social Connection and Isolation Panel [NHANES]    Frequency of Communication with Friends and Family: More than three times a week    Frequency of Social Gatherings with Friends and Family: More than three times a week    Attends Religious Services: Patient declined    Marine scientist or Organizations: Patient declined    Attends Archivist Meetings: Patient declined    Marital Status: Married  Family History: The patient's family history includes Cancer in his sister; Diabetes in his mother. There is no history of Colon cancer, Stomach cancer, or Heart attack.  ROS:   Please see the history of present illness.     EKGs/Labs/Other Studies Reviewed:    The following studies were reviewed:  Echocardiogram 03/03/2020: Impressions: 1. Left ventricular ejection fraction, by estimation, is 50 to 55%. The  left ventricle has low normal function. The left ventricle has no regional  wall motion abnormalities. Left ventricular diastolic parameters are  consistent with Grade I diastolic  dysfunction (impaired relaxation).   2. Right ventricular systolic function is normal. The right ventricular  size is normal.   3. The mitral valve is normal in structure. Mild mitral valve  regurgitation. No evidence of mitral stenosis.   4. The aortic valve is tricuspid. Aortic valve regurgitation is mild.  Mild aortic valve sclerosis is present, with no evidence of aortic valve  stenosis.   5. Aortic dilatation noted. There is mild dilatation of the ascending  aorta measuring 38 mm.   6. The inferior vena cava is normal in size with greater than 50%  respiratory variability, suggesting right atrial pressure of 3 mmHg.   Comparison(s): 02/28/14 EF 55-60%.  _______________  Event  Monitor 02/07/2020 to 03/07/2020: Indication: presyncope   Minimum HR (bpm): 41 at 9:59 am Maximum HR (bpm): 146 at 7:19 pm   Supraventricular Ectopy: 2% total beats SVT: none   Ventricular Ectopy: <1% NSVT: 16 beat run of NSVT at rate 177 bpm at 5:26 am. No symptoms recorded. Ventricular Tachycardia: no sustained VT   Pauses: none AV block: none   Atrial fibrillation: none   Diary events: none   Impressions: No diary entries. 16 beat run of NSVT at rate 177 bpm at 5:26 am without associated symptoms documented. _______________  Exercise Tolerance Test 05/10/2020: Upsloping ST segment depression ST segment depression of 1 mm was noted during stress in the II and III leads, and returning to baseline after less than 1 minute of recovery.   Up-sloping ST-segment depression segment on 1.0 mm in leads II, III that quickly resolved in recovery. Duke Treadmill Score +4. Frequent PVCs with exercise. Equivocal ECG study. _______________  Coronary CTA 10/06/2020: Impression: 1. Coronary calcium score of 174. This was 58th percentile for age, sex, and race matched control. 2. Normal coronary origin with Right dominance. 3. CAD-RADS 3. Moderate stenosis in LAD system. Consider symptom-guided anti-ischemic pharmacotherapy as well as risk factor modification per guideline directed care. Additional analysis with CT FFR will be submitted. 4. Ascending aortic atherosclerosis noted. 5. Cannot rule out small patent foramen ovale  FFR Impression: 1. CT FFR analysis shows borderline evidence of significant functional stenosis in the distal LAD.  EKG:  EKG ordered today. EKG personally reviewed and demonstrates normal sinus rhythm, rate 62 bpm, with no acute ST/T changes. Normal axis. Normal PR and QRS intervals. QTc 397 ms.  Recent Labs: 08/15/2022: ALT 13; Hemoglobin 12.9; Platelets 229.0; Pro B Natriuretic peptide (BNP) 23.0; TSH 3.85  Recent Lipid Panel    Component Value Date/Time    CHOL 192 08/15/2022 1023   CHOL 219 (H) 01/11/2017 1450   TRIG 275.0 (H) 08/15/2022 1023   HDL 32.60 (L) 08/15/2022 1023   HDL 37 (L) 01/11/2017 1450   CHOLHDL 6 08/15/2022 1023   VLDL 55.0 (H) 08/15/2022 1023   LDLCALC 45 02/13/2021 1659   LDLCALC 144 (H) 01/11/2017 1450   LDLDIRECT 110.0 08/15/2022 1023  Physical Exam:    Vital Signs: BP 122/72   Pulse 62   Ht 6' (1.829 m)   Wt 185 lb 6.4 oz (84.1 kg)   SpO2 98%   BMI 25.14 kg/m     Orthostatic Vital Signs: Supine:   BP 126/76, P 60  Sitting:   BP 127/77, P 62 Standing (0 minutes): BP 116/74, P 66 Standing (3 minutes): BP 127/77, P 66    Wt Readings from Last 3 Encounters:  09/18/22 185 lb 6.4 oz (84.1 kg)  08/15/22 184 lb (83.5 kg)  08/09/22 185 lb (83.9 kg)    General: 82 y.o. male in no acute distress. HEENT: Normocephalic and atraumatic. Sclera clear.  Neck: Supple. No carotid bruits. No JVD. Heart: RRR. Distinct S1 and S2. No murmurs, gallops, or rubs.  Lungs: No increased work of breathing. Clear to ausculation bilaterally. No wheezes, rhonchi, or rales.  Abdomen: Soft, non-distended, and non-tender to palpation.  Extremities: No lower extremity edema.    Skin: Warm and dry. Neuro: Alert and oriented x3. No focal deficits. Psych: Normal affect. Responds appropriately.  Assessment:    1. Dizziness   2. History of sinus bradycardia   3. Chest pain of uncertain etiology   4. Non-obstructive CAD   5. Hyperlipidemia, unspecified hyperlipidemia type     Plan:    Dizziness Patient has a history of intermittent dizziness as well as near syncope/ syncope that has sounded orthostatic in the past. Monitor in 01/2020 showed nderlying sinus rhythm (min rate 41 bpm, max rate 146 bpm) and one 16 beat run of NSVT but no significant arrhythmias. Echo showed normal LV function.  - He continues to report intermittent dizziness mostly with bending over and then standing up. He likes to garden so has frequent episodes  with this. Episodes are very brief and last about 1 minute and then go away. - EKG today shows normal sinus rhythm, rate 62 bpm, with no acute ST/T changes. - Orthostatic vital signs negative in the office today. - Will repeat 2 week Zio monitor to assess for any significant bradycardia or high grade AV block.  - Also recommended staying well hydrated and wearing compressions stockings to see if this helps.   History of Sinus Bradycardia History of sinus bradycardia with rates as low as the 40s. Not on any AV nodal agents.  - Rate in the 60s today. - Continue to avoid any AV nodal agents.  - Will order 2 week Zio monitor as above.  Chest Pain Non-Obstructive CAD Coronary CTA in 09/2020 showed a coronary calcium score of 174 (58th percentile for age and sex) and moderate CAD in the LAD system. FFR was negative. - He describes some continued intermittent chest pain that he states feels like gas/reflux. This is not new and is unchanged from when he had his coronary CTA in 09/2020. - EKG today shows no acute ischemic changes. - Continue aspirin and statin. Of note, he is only taking his Aspirin 81mg  every other day. Advised him to take this daily. - Symptoms sound like possible reflux. He takes Omeprazole but does not think this helps. Will stop Omeprazole and start Protonix 40mg  daily. Also advised patient to follow-up with GI who he has seen in the past. Will hold off on any additional ischemic work-up at this time.  Hyperlipidemia Lipid panel in 08/2022: Total Cholesterol 192, Triglycerides 275, HDL 32.6. Direct LDL 110. LDL goal <70 given CAD. - Currently on Lipitor 40mg  daily. Will increase to 80mg   daily.  - Will need repeat lipid panel and LFTs in 6-8 weeks.   Disposition: Patient already has a follow-up visit with Dr. Margaretann Loveless scheduled for 12/23/2022. Advised patient to keep this.   Medication Adjustments/Labs and Tests Ordered: Current medicines are reviewed at length with the patient  today.  Concerns regarding medicines are outlined above.  Orders Placed This Encounter  Procedures   Lipid panel   Hepatic function panel   LONG TERM MONITOR (3-14 DAYS)   EKG 12-Lead   Meds ordered this encounter  Medications   pantoprazole (PROTONIX) 40 MG tablet    Sig: Take 1 tablet (40 mg total) by mouth daily.    Dispense:  90 tablet    Refill:  3   atorvastatin (LIPITOR) 80 MG tablet    Sig: Take 1 tablet (80 mg total) by mouth daily.    Dispense:  90 tablet    Refill:  3    Patient Instructions  Medication Instructions:  STOP Omeprazole as directed. Start Pantoprazole 40 mg daily as directed. Asprin 81 mg daily as directed. Increase Lipitor 80 mg daily as directed.  *If you need a refill on your cardiac medications before your next appointment, please call your pharmacy*   Lab Work: Your physician recommends that you return for lab work in 2 months Fasting Lipid Panel, LFTs  If you have labs (blood work) drawn today and your tests are completely normal, you will receive your results only by: [place] (if you have MyChart) OR A paper copy in the mail If you have any lab test that is abnormal or we need to change your treatment, we will call you to review the results.  Testing/Procedures: Bryn Gulling- Long Term Monitor Instructions  Your physician has requested you wear a ZIO patch monitor for 14 days.  This is a single patch monitor. [employer] supplies one patch monitor per enrollment. Additional stickers are not available. Please do not apply patch if you will be having a Nuclear Stress Test,  Echocardiogram, Cardiac CT, MRI, or Chest Xray during the period you would be wearing the  monitor. The patch cannot be worn during these tests. You cannot remove and re-apply the  ZIO XT patch monitor.  Your ZIO patch monitor will be mailed 3 day USPS to your address on file. It may take 3-5 days  to receive your monitor after you have been enrolled.  Once you have  received your monitor, please review the enclosed instructions. Your monitor  has already been registered assigning a specific monitor serial # to you.  Billing and Patient Assistance Program Information  We have supplied [employer] with any of your insurance information on file for billing purposes. [employer] offers a sliding scale Patient Assistance Program for patients that do not have  insurance, or whose insurance does not completely cover the cost of the ZIO monitor.  You must apply for the Patient Assistance Program to qualify for this discounted rate.  To apply, please call [employer] at [phone], select option 4, select option 2, ask to apply for  Patient Assistance Program. Theodore Demark will ask your household income, and how many people  are in your household. They will quote your out-of-pocket cost based on that information.  [employer] will also be able to set up a 5-month, interest-free payment plan if needed.  Applying the monitor   Shave hair from upper left chest.  Hold abrader disc by orange tab. Rub abrader in 40 strokes over the upper left chest as  indicated in your monitor instructions.  Clean area with 4 enclosed alcohol pads. Let dry.  Apply patch as indicated in monitor instructions. Patch will be placed under collarbone on left  side of chest with arrow pointing upward.  Rub patch adhesive wings for 2 minutes. Remove white label marked "1". Remove the white  label marked "2". Rub patch adhesive wings for 2 additional minutes.  While looking in a mirror, press and release button in center of patch. A small green light will  flash 3-4 times. This will be your only indicator that the monitor has been turned on.  Do not shower for the first 24 hours. You may shower after the first 24 hours.  Press the button if you feel a symptom. You will hear a small click. Record Date, Time and  Symptom in the Patient Logbook.  When you are ready to remove the patch, follow instructions on  the last 2 pages of Patient  Logbook. Stick patch monitor onto the last page of Patient Logbook.  Place Patient Logbook in the blue and white box. Use locking tab on box and tape box closed  securely. The blue and white box has prepaid postage on it. Please place it in the mailbox as  soon as possible. Your physician should have your test results approximately 7 days after the  monitor has been mailed back to [facility].  Call [place] at [phone] if you have questions regarding  your ZIO XT patch monitor. Call them immediately if you see an orange light blinking on your  monitor.  If your monitor falls off in less than 4 days, contact our Monitor department at [phone].  If your monitor becomes loose or falls off after 4 days call [employer] at [phone] for  suggestions on securing your monitor   Follow-Up: At [facility], you and your health needs are our priority.  As part of our continuing mission to provide you with exceptional heart care, we have created designated Provider Care Teams.  These Care Teams include your primary Cardiologist (physician) and Advanced Practice Providers (APPs -  Physician Assistants and Nurse Practitioners) who all work together to provide you with the care you need, when you need it.  Your next appointment:    Keep scheduled appointment   Provider:   Elouise Munroe, MD     Other Instructions     Signed, Darreld Mclean, PA-C  09/18/2022 1:12 PM    [place]

## 2022-09-16 NOTE — Telephone Encounter (Signed)
Dr Margaretann Loveless wanted pt to be seen with next available with APP-  Appt made for 09/18/22 at [place] with C.Goodrich, [place] pt's daughter and notified of appt; she is able to bring him to the appt on Wed 09/18/22.

## 2022-09-18 ENCOUNTER — Ambulatory Visit (INDEPENDENT_AMBULATORY_CARE_PROVIDER_SITE_OTHER): Payer: 59

## 2022-09-18 ENCOUNTER — Other Ambulatory Visit: Payer: Self-pay

## 2022-09-18 ENCOUNTER — Ambulatory Visit: Payer: 59 | Attending: Student | Admitting: Student

## 2022-09-18 ENCOUNTER — Encounter: Payer: Self-pay | Admitting: Student

## 2022-09-18 VITALS — BP 122/72 | HR 62 | Ht 72.0 in | Wt 185.4 lb

## 2022-09-18 DIAGNOSIS — R42 Dizziness and giddiness: Secondary | ICD-10-CM | POA: Diagnosis not present

## 2022-09-18 DIAGNOSIS — E785 Hyperlipidemia, unspecified: Secondary | ICD-10-CM

## 2022-09-18 DIAGNOSIS — R079 Chest pain, unspecified: Secondary | ICD-10-CM

## 2022-09-18 DIAGNOSIS — I2510 Atherosclerotic heart disease of native coronary artery without angina pectoris: Secondary | ICD-10-CM

## 2022-09-18 DIAGNOSIS — Z8679 Personal history of other diseases of the circulatory system: Secondary | ICD-10-CM

## 2022-09-18 MED ORDER — ATORVASTATIN CALCIUM 80 MG PO TABS: 80.0000 mg | ORAL_TABLET | Freq: Every day | ORAL | 3 refills | Status: AC

## 2022-09-18 MED ORDER — PANTOPRAZOLE SODIUM 40 MG PO TBEC: 40.0000 mg | DELAYED_RELEASE_TABLET | Freq: Every day | ORAL | 3 refills | Status: AC

## 2022-09-18 NOTE — Progress Notes (Unsigned)
Enrolled patient for a 14 day Zio XT monitor to be mailed to patients home  ? ?Dr Acharya to read ?

## 2022-09-18 NOTE — Patient Instructions (Addendum)
Medication Instructions:  STOP Omeprazole as directed. Start Pantoprazole 40 mg daily as directed. Asprin 81 mg daily as directed. Increase Lipitor 80 mg daily as directed.  *If you need a refill on your cardiac medications before your next appointment, please call your pharmacy*   Lab Work: Your physician recommends that you return for lab work in 2 months Fasting Lipid Panel, LFTs  If you have labs (blood work) drawn today and your tests are completely normal, you will receive your results only by: [place] (if you have MyChart) OR A paper copy in the mail If you have any lab test that is abnormal or we need to change your treatment, we will call you to review the results.  Testing/Procedures: Bryn Gulling- Long Term Monitor Instructions  Your physician has requested you wear a ZIO patch monitor for 14 days.  This is a single patch monitor. [employer] supplies one patch monitor per enrollment. Additional stickers are not available. Please do not apply patch if you will be having a Nuclear Stress Test,  Echocardiogram, Cardiac CT, MRI, or Chest Xray during the period you would be wearing the  monitor. The patch cannot be worn during these tests. You cannot remove and re-apply the  ZIO XT patch monitor.  Your ZIO patch monitor will be mailed 3 day USPS to your address on file. It may take 3-5 days  to receive your monitor after you have been enrolled.  Once you have received your monitor, please review the enclosed instructions. Your monitor  has already been registered assigning a specific monitor serial # to you.  Billing and Patient Assistance Program Information  We have supplied [employer] with any of your insurance information on file for billing purposes. [employer] offers a sliding scale Patient Assistance Program for patients that do not have  insurance, or whose insurance does not completely cover the cost of the ZIO monitor.  You must apply for the Patient Assistance Program  to qualify for this discounted rate.  To apply, please call [employer] at [phone], select option 4, select option 2, ask to apply for  Patient Assistance Program. Theodore Demark will ask your household income, and how many people  are in your household. They will quote your out-of-pocket cost based on that information.  [employer] will also be able to set up a 66-month, interest-free payment plan if needed.  Applying the monitor   Shave hair from upper left chest.  Hold abrader disc by orange tab. Rub abrader in 40 strokes over the upper left chest as  indicated in your monitor instructions.  Clean area with 4 enclosed alcohol pads. Let dry.  Apply patch as indicated in monitor instructions. Patch will be placed under collarbone on left  side of chest with arrow pointing upward.  Rub patch adhesive wings for 2 minutes. Remove white label marked "1". Remove the white  label marked "2". Rub patch adhesive wings for 2 additional minutes.  While looking in a mirror, press and release button in center of patch. A small green light will  flash 3-4 times. This will be your only indicator that the monitor has been turned on.  Do not shower for the first 24 hours. You may shower after the first 24 hours.  Press the button if you feel a symptom. You will hear a small click. Record Date, Time and  Symptom in the Patient Logbook.  When you are ready to remove the patch, follow instructions on the last 2 pages of Patient  Logbook. Stick patch monitor onto the last page of Patient Logbook.  Place Patient Logbook in the blue and white box. Use locking tab on box and tape box closed  securely. The blue and white box has prepaid postage on it. Please place it in the mailbox as  soon as possible. Your physician should have your test results approximately 7 days after the  monitor has been mailed back to [facility].  Call [place] at [phone] if you have questions regarding  your ZIO XT  patch monitor. Call them immediately if you see an orange light blinking on your  monitor.  If your monitor falls off in less than 4 days, contact our Monitor department at [phone].  If your monitor becomes loose or falls off after 4 days call [employer] at [phone] for  suggestions on securing your monitor   Follow-Up: At [facility], you and your health needs are our priority.  As part of our continuing mission to provide you with exceptional heart care, we have created designated Provider Care Teams.  These Care Teams include your primary Cardiologist (physician) and Advanced Practice Providers (APPs -  Physician Assistants and Nurse Practitioners) who all work together to provide you with the care you need, when you need it.  Your next appointment:    Keep scheduled appointment   Provider:   Elouise Munroe, MD     Other Instructions

## 2022-09-20 DIAGNOSIS — R42 Dizziness and giddiness: Secondary | ICD-10-CM | POA: Diagnosis not present

## 2022-10-07 DIAGNOSIS — H34811 Central retinal vein occlusion, right eye, with macular edema: Secondary | ICD-10-CM | POA: Diagnosis not present

## 2022-10-14 DIAGNOSIS — R42 Dizziness and giddiness: Secondary | ICD-10-CM | POA: Diagnosis not present

## 2022-11-29 DIAGNOSIS — H34831 Tributary (branch) retinal vein occlusion, right eye, with macular edema: Secondary | ICD-10-CM | POA: Diagnosis not present

## 2022-12-23 ENCOUNTER — Encounter: Payer: Self-pay | Admitting: Internal Medicine

## 2022-12-23 ENCOUNTER — Ambulatory Visit: Payer: 59 | Attending: Internal Medicine | Admitting: Internal Medicine

## 2022-12-23 VITALS — BP 138/72 | HR 57 | Ht 72.0 in | Wt 183.8 lb

## 2022-12-23 DIAGNOSIS — E785 Hyperlipidemia, unspecified: Secondary | ICD-10-CM | POA: Diagnosis not present

## 2022-12-23 DIAGNOSIS — R42 Dizziness and giddiness: Secondary | ICD-10-CM

## 2022-12-23 DIAGNOSIS — I10 Essential (primary) hypertension: Secondary | ICD-10-CM

## 2022-12-23 DIAGNOSIS — R079 Chest pain, unspecified: Secondary | ICD-10-CM

## 2022-12-23 DIAGNOSIS — Z8679 Personal history of other diseases of the circulatory system: Secondary | ICD-10-CM

## 2022-12-23 DIAGNOSIS — R001 Bradycardia, unspecified: Secondary | ICD-10-CM

## 2022-12-23 DIAGNOSIS — I2510 Atherosclerotic heart disease of native coronary artery without angina pectoris: Secondary | ICD-10-CM | POA: Diagnosis not present

## 2022-12-23 NOTE — Progress Notes (Signed)
Cardiology Office Note:    Date:  12/23/2022   ID:  Fernando Saunders, DOB 1941-04-27, MRN [id number]  PCP:  Etta Grandchild, MD  Cardiologist:  Parke Poisson, MD  Electrophysiologist:  Lanier Prude, MD   Referring MD: Etta Grandchild, MD   Chief Complaint: dizziness, chest pain  History of Present Illness:    Fernando Saunders is a 82 y.o. male with a history of non-obstructive CAD on coronary CTA in 09/2020, dizziness, bradycardia, hyperlipidemia, GERD, and diverticulosis who is followed by Dr. Jacques Navy and presents today for evaluation of dizziness.   Patient has a history of resting sinus bradycardia and intermittent dizziness. Patient was seen in the ED in 12/2019 for dizziness and was noted to be bradycardic at that time with heart rates in the 40s. He was referred to Cardiology. He was seen by Dr. Jacques Navy in 01/2020 at which time he described pre-syncope and syncope with position changes concerning for orthostasis. Echo and 30 Day Event Monitor for further evaluation. Echo showed LVEF of 50-55% with no regional wall motion abnormalities and grade 1 diastolic dysfunction as well as mild MR. Monitor showed underlying sinus rhythm (min rate 41 bpm, max rate 146 bpm) and one 16 beat run of NSVT but no significant arrhythmias. She was referred to EP at the patient's request and seen by Dr. Lalla Brothers but no PPM felt to be necessary at that time. At an office visit in 05/2020, patient reported some chest pain. ETT was ordered and was equivocal. Therefore, coronary CTA was ordered and showed a coronary calcium score of 174 (58th percentile for age and sex) and moderate CAD in the LAD system. FFR was negative.  Patient was seen by Bernadene Person, NP, in 11/2021 at which time he was doing well and denied any dizziness. His biggest complaint was some right arm pain with associated numbness/ tingling. He was being followed by Ortho for this as it was felt to be due to arthritis.   Patient's daughter  called our office on 08/18/2022 with intermittent dizziness and saw Marjie Skiff PA-C. Therefore, this office visit was arranged for further evaluation. He presents today with his daughter. He states he has been having intermittent dizziness for a couple of years. He notices this mostly after bending over and then standing up. However, he was walking a few days ago and started having dizziness and felt like he could not walk straight. These dizzy episodes are very brief and only last for about 1 minute. He likes to garden so he has frequent episodes with this. Daughter thinks it is due to a low heart rate. He reports occasional heaviness in his chest that feels like "gasses"  as well as some pain down his left arm. However, this is not new. He states he has been having this for a while and it is essentially unchanged from when he had his coronary CTA in 09/2020. He initially said it was worse with exertion but then when I asked him to elaborate on this he just went back to his dizziness when he bends over. Patient thinks the chest discomfort is more due to reflux and feels like "gas." He takes Omeprazole but it does not seem to help. He has seen GI in the past. His daughter states he breathes "heavier" when he is exerting himself but no real shortness of breath. No orthopnea, PND, or edema.   Patient seen today 12/23/2022 with his daughter providing interpretive services per their preference.  Patient  notes that he has been concerned about chest discomfort which has not worsened and in fact may be a slightly less, but does not feel he has a known etiology.  We reviewed coronary CTA results which demonstrated diffuse moderate disease with no proximal or mid FFR positive lesions.  Distal lesions may be due to diffuse disease or vessel tapering, or may represent stenosis but at the time was felt to be most amenable to medical management.  We have struggled with medical management given patient's resting bradycardia  and frequent concerns of dizziness, limiting her ability to provide beta-blockade and long-acting nitrate therapy.  We discussed in great detail that long-acting nitrate such as Imdur could very well help with this chest discomfort but I do worry it may exacerbate his positional dizziness.  Patient is endorsing being averse to additional medication therapy and feels that whenever he mentions his symptoms he is given "more and more and more medication."  We discussed that the optimal treatment strategy of nonobstructive CAD is medical therapy based on guidelines and extensive literature.  We reviewed that further management of his lipids is advisable and I would recommend consideration of PCSK9 inhibitor therapy for LDL lowering as well as potential plaque modification.  He is agreeable to this and we will arrange for CVRR lipid clinic.  With regard to the patient's chest pain, I did offer repeat stress testing at our visit today.  I would recommend PET/CT with myocardial blood flow if the patient does pursue stress testing.  I have also offered conventional coronary angiography if the patient remains quite concerned about his symptoms or feels they are escalating.  He declined both of these options today.  I also offered the patient a second opinion with one of my partners, particularly one who specializes in interventional cardiology if he would prefer, and he declined.  Patient continues to have dizziness but please recall from last visit his orthostatics were unremarkable, I discussed conservative measures including adequate hydration, rising from a seated position slowly, and compression socks.  Patient feels he cannot wear compression socks.   Past Medical History:  Diagnosis Date   Arthritis    Diverticulosis    GERD (gastroesophageal reflux disease)    Hyperlipidemia     Past Surgical History:  Procedure Laterality Date   COLONOSCOPY  11/04/2011   Dr. Stan Head   EYE SURGERY       Current Medications: Current Meds  Medication Sig   aspirin EC 81 MG tablet Take 1 tablet (81 mg total) by mouth daily. Swallow whole.   atorvastatin (LIPITOR) 80 MG tablet Take 1 tablet (80 mg total) by mouth daily.   gabapentin (NEURONTIN) 100 MG capsule Take 1 capsule by mouth at bedtime   GEMTESA 75 MG TABS Take 1 tablet by mouth daily.   Multiple Vitamin (MULTIVITAMIN) tablet Take 1 tablet by mouth daily.   MYRBETRIQ 50 MG TB24 tablet Take 1 tablet by mouth daily.   omega-3 acid ethyl esters (LOVAZA) 1 g capsule Take by mouth as needed.   pantoprazole (PROTONIX) 40 MG tablet Take 1 tablet (40 mg total) by mouth daily.   tamsulosin (FLOMAX) 0.4 MG CAPS capsule Take 0.4 mg by mouth daily.   triamcinolone cream (KENALOG) 0.1 % Apply 1 application topically daily as needed.     Allergies:   Patient has no known allergies.   Social History   Socioeconomic History   Marital status: Married    Spouse name: Not on file  Number of children: 2   Years of education: Not on file   Highest education level: Not on file  Occupational History   Occupation: Unemployed  Tobacco Use   Smoking status: Never   Smokeless tobacco: Never  Vaping Use   Vaping Use: Never used  Substance and Sexual Activity   Alcohol use: Yes    Alcohol/week: 0.0 standard drinks of alcohol    Comment: occasional   Drug use: No   Sexual activity: Not on file  Other Topics Concern   Not on file  Social History Narrative   Not on file   Social Determinants of Health   Financial Resource Strain: Low Risk  (08/09/2022)   Overall Financial Resource Strain (CARDIA)    Difficulty of Paying Living Expenses: Not hard at all  Food Insecurity: No Food Insecurity (08/09/2022)   Hunger Vital Sign    Worried About Running Out of Food in the Last Year: Never true    Ran Out of Food in the Last Year: Never true  Transportation Needs: No Transportation Needs (08/09/2022)   PRAPARE - Scientist, research (physical sciences) (Medical): No    Lack of Transportation (Non-Medical): No  Physical Activity: Sufficiently Active (08/09/2022)   Exercise Vital Sign    Days of Exercise per Week: 5 days    Minutes of Exercise per Session: 30 min  Stress: No Stress Concern Present (08/09/2022)   [employer] of Occupational Health - Occupational Stress Questionnaire    Feeling of Stress : Not at all  Social Connections: Unknown (08/09/2022)   Social Connection and Isolation Panel [NHANES]    Frequency of Communication with Friends and Family: More than three times a week    Frequency of Social Gatherings with Friends and Family: More than three times a week    Attends Religious Services: Patient declined    Database administrator or Organizations: Patient declined    Attends Banker Meetings: Patient declined    Marital Status: Married     Family History: The patient's family history includes Cancer in his sister; Diabetes in his mother. There is no history of Colon cancer, Stomach cancer, or Heart attack.  ROS:   Please see the history of present illness.     EKGs/Labs/Other Studies Reviewed:    The following studies were reviewed:  Echocardiogram 03/03/2020: Impressions: 1. Left ventricular ejection fraction, by estimation, is 50 to 55%. The  left ventricle has low normal function. The left ventricle has no regional  wall motion abnormalities. Left ventricular diastolic parameters are  consistent with Grade I diastolic  dysfunction (impaired relaxation).   2. Right ventricular systolic function is normal. The right ventricular  size is normal.   3. The mitral valve is normal in structure. Mild mitral valve  regurgitation. No evidence of mitral stenosis.   4. The aortic valve is tricuspid. Aortic valve regurgitation is mild.  Mild aortic valve sclerosis is present, with no evidence of aortic valve  stenosis.   5. Aortic dilatation noted. There is mild dilatation of the ascending   aorta measuring 38 mm.   6. The inferior vena cava is normal in size with greater than 50%  respiratory variability, suggesting right atrial pressure of 3 mmHg.   Comparison(s): 02/28/14 EF 55-60%.  _______________  Event Monitor 02/07/2020 to 03/07/2020: Indication: presyncope   Minimum HR (bpm): 41 at 9:59 am Maximum HR (bpm): 146 at 7:19 pm   Supraventricular Ectopy: 2% total beats SVT: none  Ventricular Ectopy: <1% NSVT: 16 beat run of NSVT at rate 177 bpm at 5:26 am. No symptoms recorded. Ventricular Tachycardia: no sustained VT   Pauses: none AV block: none   Atrial fibrillation: none   Diary events: none   Impressions: No diary entries. 16 beat run of NSVT at rate 177 bpm at 5:26 am without associated symptoms documented. _______________  Exercise Tolerance Test 05/10/2020: Upsloping ST segment depression ST segment depression of 1 mm was noted during stress in the II and III leads, and returning to baseline after less than 1 minute of recovery.   Up-sloping ST-segment depression segment on 1.0 mm in leads II, III that quickly resolved in recovery. Duke Treadmill Score +4. Frequent PVCs with exercise. Equivocal ECG study. _______________  Coronary CTA 10/06/2020: Impression: 1. Coronary calcium score of 174. This was 58th percentile for age, sex, and race matched control. 2. Normal coronary origin with Right dominance. 3. CAD-RADS 3. Moderate stenosis in LAD system. Consider symptom-guided anti-ischemic pharmacotherapy as well as risk factor modification per guideline directed care. Additional analysis with CT FFR will be submitted. 4. Ascending aortic atherosclerosis noted. 5. Cannot rule out small patent foramen ovale  FFR Impression: 1. CT FFR analysis shows borderline evidence of significant functional stenosis in the distal LAD.  EKG:  n/a  Recent Labs: 08/15/2022: ALT 13; Hemoglobin 12.9; Platelets 229.0; Pro B Natriuretic peptide (BNP) 23.0; TSH 3.85   Recent Lipid Panel    Component Value Date/Time   CHOL 192 08/15/2022 1023   CHOL 219 (H) 01/11/2017 1450   TRIG 275.0 (H) 08/15/2022 1023   HDL 32.60 (L) 08/15/2022 1023   HDL 37 (L) 01/11/2017 1450   CHOLHDL 6 08/15/2022 1023   VLDL 55.0 (H) 08/15/2022 1023   LDLCALC 45 02/13/2021 1659   LDLCALC 144 (H) 01/11/2017 1450   LDLDIRECT 110.0 08/15/2022 1023    Physical Exam:    Vital Signs: BP 138/72 (BP Location: Right Arm, Patient Position: Sitting, Cuff Size: Normal)   Pulse (!) 57   Ht 6' (1.829 m)   Wt 183 lb 12.8 oz (83.4 kg)   SpO2 97%   BMI 24.93 kg/m     Last visit: Orthostatic Vital Signs: Supine:   BP 126/76, P 60  Sitting:   BP 127/77, P 62 Standing (0 minutes): BP 116/74, P 66 Standing (3 minutes): BP 127/77, P 66    Wt Readings from Last 3 Encounters:  12/23/22 183 lb 12.8 oz (83.4 kg)  09/18/22 185 lb 6.4 oz (84.1 kg)  08/15/22 184 lb (83.5 kg)    Constitutional: No acute distress Eyes: sclera non-icteric, normal conjunctiva and lids ENMT: normal dentition, moist mucous membranes Cardiovascular: regular rhythm, normal rate, no murmur. S1 and S2 normal. No jugular venous distention.  Respiratory: clear to auscultation bilaterally GI : normal bowel sounds, soft and nontender. No distention.   MSK: extremities warm, well perfused. No edema.  NEURO: grossly nonfocal exam, moves all extremities. PSYCH: alert and oriented x 3, normal mood and affect.    Assessment:    1. Hyperlipidemia, unspecified hyperlipidemia type   2. Hyperlipidemia LDL goal <70   3. Coronary artery disease involving native coronary artery of native heart without angina pectoris   4. Dizziness   5. History of sinus bradycardia   6. Bradycardia   7. Chest pain of uncertain etiology   8. Essential hypertension      Plan:    Dizziness History of Sinus Bradycardia -Patient had a repeat monitor performed  with no malignant findings.  He does have a 6% burden of bradycardia,  but by diary report does not seem to correlate with his concerns of dizziness unless reporting was insufficient.  I have previously referred him to electrophysiology who felt PPM not indicated at this time.  Recommend continued observation I have recommended conservative measures and strongly urged these to the patient.  Chest Pain Non-Obstructive CAD Coronary CTA in 09/2020 showed a coronary calcium score of 174 (58th percentile for age and sex) and moderate CAD in the LAD system. FFR was negative. -By last visit symptoms felt to be related to GERD.  Patient is somewhat averse to additional medication therapy though omeprazole was recommended. -I have offered the patient repeat stress testing, coronary angiography, and second opinion all of which he deferred.  We could consider a low-dose of a long-acting nitrate, patient does not wish to potentially exacerbate his dizziness and declined today.  If the patient has worsening symptoms of angina, recommend PET CT with myocardial blood flow stress test given known moderate CAD with probable distal stenoses.  Hyperlipidemia Lipid panel in 08/2022: Total Cholesterol 192, Triglycerides 275, HDL 32.6. Direct LDL 110. LDL goal <70 given CAD. - Currently on Lipitor 80mg  daily.  -He is due for repeat lipids, recommend he obtain these in conjunction with his CVRR visit.  Recommend PCSK9 inhibitor therapy as I feel this will be the most appropriate next steps.    Medication Adjustments/Labs and Tests Ordered: Current medicines are reviewed at length with the patient today.  Concerns regarding medicines are outlined above.  Orders Placed This Encounter  Procedures   AMB Referral to [facility] Pharm-D   No orders of the defined types were placed in this encounter.   Patient Instructions  Medication Instructions:  Your physician recommends that you continue on your current medications as directed. Please refer to the Current Medication list given to you  today.  *If you need a refill on your cardiac medications before your next appointment, please call your pharmacy*    Follow-Up: At [facility], you and your health needs are our priority.  As part of our continuing mission to provide you with exceptional heart care, we have created designated Provider Care Teams.  These Care Teams include your primary Cardiologist (physician) and Advanced Practice Providers (APPs -  Physician Assistants and Nurse Practitioners) who all work together to provide you with the care you need, when you need it.  We recommend signing up for the patient portal called "MyChart".  Sign up information is provided on this After Visit Summary.  MyChart is used to connect with patients for Virtual Visits (Telemedicine).  Patients are able to view lab/test results, encounter notes, upcoming appointments, etc.  Non-urgent messages can be sent to your provider as well.   To learn more about what you can do with MyChart, go to [email or web address].    Your next appointment:   6 month(s)  Provider:   Marjie Skiff, PA-C, Joni Reining, DNP, ANP, Azalee Course, PA-C, or Bernadene Person, NP      Then, Parke Poisson, MD will plan to see you again in 12 month(s).    Other Instructions Dr. Jacques Navy recommends that you see a clinical PharmD to discuss cholesterol therapy.

## 2022-12-23 NOTE — Patient Instructions (Signed)
Medication Instructions:  Your physician recommends that you continue on your current medications as directed. Please refer to the Current Medication list given to you today.  *If you need a refill on your cardiac medications before your next appointment, please call your pharmacy*    Follow-Up: At [facility], you and your health needs are our priority.  As part of our continuing mission to provide you with exceptional heart care, we have created designated Provider Care Teams.  These Care Teams include your primary Cardiologist (physician) and Advanced Practice Providers (APPs -  Physician Assistants and Nurse Practitioners) who all work together to provide you with the care you need, when you need it.  We recommend signing up for the patient portal called "MyChart".  Sign up information is provided on this After Visit Summary.  MyChart is used to connect with patients for Virtual Visits (Telemedicine).  Patients are able to view lab/test results, encounter notes, upcoming appointments, etc.  Non-urgent messages can be sent to your provider as well.   To learn more about what you can do with MyChart, go to [email or web address].    Your next appointment:   6 month(s)  Provider:   Marjie Skiff, PA-C, Joni Reining, DNP, ANP, Azalee Course, PA-C, or Bernadene Person, NP      Then, Parke Poisson, MD will plan to see you again in 12 month(s).    Other Instructions Dr. Jacques Navy recommends that you see a clinical PharmD to discuss cholesterol therapy.

## 2023-02-14 DIAGNOSIS — H43813 Vitreous degeneration, bilateral: Secondary | ICD-10-CM | POA: Diagnosis not present

## 2023-02-14 DIAGNOSIS — Z961 Presence of intraocular lens: Secondary | ICD-10-CM | POA: Diagnosis not present

## 2023-02-14 DIAGNOSIS — H35371 Puckering of macula, right eye: Secondary | ICD-10-CM | POA: Diagnosis not present

## 2023-02-14 DIAGNOSIS — H34811 Central retinal vein occlusion, right eye, with macular edema: Secondary | ICD-10-CM | POA: Diagnosis not present

## 2023-02-14 DIAGNOSIS — H35033 Hypertensive retinopathy, bilateral: Secondary | ICD-10-CM | POA: Diagnosis not present

## 2023-02-18 ENCOUNTER — Ambulatory Visit: Payer: 59 | Attending: Cardiology | Admitting: Pharmacist

## 2023-02-18 ENCOUNTER — Encounter: Payer: Self-pay | Admitting: Pharmacist

## 2023-02-18 DIAGNOSIS — R42 Dizziness and giddiness: Secondary | ICD-10-CM | POA: Diagnosis not present

## 2023-02-18 DIAGNOSIS — E785 Hyperlipidemia, unspecified: Secondary | ICD-10-CM

## 2023-02-18 DIAGNOSIS — I2510 Atherosclerotic heart disease of native coronary artery without angina pectoris: Secondary | ICD-10-CM | POA: Diagnosis not present

## 2023-02-18 DIAGNOSIS — R072 Precordial pain: Secondary | ICD-10-CM

## 2023-02-18 NOTE — Patient Instructions (Addendum)
It was nice meeting you today  We would like your LDL (bad cholesterol) to be less than 70  Please continue your atorvastatin 80mg  daily  We will recheck your cholesterol panel today  The medication we discussed today is called Repatha which you would inject once every 2 weeks  After your start the medication we would recheck your cholesterol in 3 months  I will message Dr Jacques Navy to place your referrals  Please let us know if you have any questions  Laural Golden, PharmD, BCACP, CDCES, CPP [address] [place], [place], [address] Phone: [phone], Fax: [phone]

## 2023-02-18 NOTE — Progress Notes (Unsigned)
Patient ID: Fernando Saunders                 DOB: 12-Mar-1941                    MRN: [id number]     HPI: Fernando Saunders is a 82 y.o. male patient referred to lipid clinic by Dr Jacques Navy. PMH is significant for HTN, HLD, and elevated coronary calcium score. Patient has reported chest pain and dizziness for many years.  Patient presents today with daughter. Translator Brien Few also in room. Patient mostly concerned regarding chest discomfort and dizziness when standing. Previously declined referrals to interventionalist.  Patient reports he has been practicing diet changes since last visit and would like cholesterol panel rechecked. Remains on atorvastatin 80mg  Saunders with no patient reported adverse effects.  Current Medications:  Atorvastatin 80mg  Saunders  Risk Factors:  CAD Elevated coronary calcium score  LDL goal: <70  Labs: TC 192, Trigs 275, HDL 32.6, Direct LDL 110 (4/40/34 -  on atorvastatin 80)  Imaging:   Coronary calcium score of 174. This was 58th percentile for age, sex, and race matched control.   2. Normal coronary origin with Right dominance.   3. CAD-RADS 3. Moderate stenosis in LAD system. Consider symptom-guided anti-ischemic pharmacotherapy as well as risk factor modification per guideline directed care. Additional analysis with CT FFR will be submitted.   4. Ascending aortic atherosclerosis noted.  Past Medical History:  Diagnosis Date   Arthritis    Diverticulosis    GERD (gastroesophageal reflux disease)    Hyperlipidemia     Current Outpatient Medications on File Prior to Visit  Medication Sig Dispense Refill   aspirin EC 81 MG tablet Take 1 tablet (81 mg total) by mouth Saunders. Swallow whole. 100 tablet 3   atorvastatin (LIPITOR) 80 MG tablet Take 1 tablet (80 mg total) by mouth Saunders. 90 tablet 3   gabapentin (NEURONTIN) 100 MG capsule Take 1 capsule by mouth at bedtime 90 capsule 0   GEMTESA 75 MG TABS Take 1 tablet by mouth Saunders.      Multiple Vitamin (MULTIVITAMIN) tablet Take 1 tablet by mouth Saunders.     MYRBETRIQ 50 MG TB24 tablet Take 1 tablet by mouth Saunders.     omega-3 acid ethyl esters (LOVAZA) 1 g capsule Take by mouth as needed.     pantoprazole (PROTONIX) 40 MG tablet Take 1 tablet (40 mg total) by mouth Saunders. 90 tablet 3   tamsulosin (FLOMAX) 0.4 MG CAPS capsule Take 0.4 mg by mouth Saunders.     triamcinolone cream (KENALOG) 0.1 % Apply 1 application topically Saunders as needed. 453 g 2   No current facility-administered medications on file prior to visit.    No Known Allergies  Assessment/Plan:  1. Hyperlipidemia - Patient last LDL 110 which is above goal of <70.  Will repeat today since patient reports healthy lifestyle changes. If above goal, recommend PCSK9i.  Using demo pen, educated patient on mechanism of action, storage, site selection, and administration. Patient believes he can administer at home. Will pursue PA if lipid panel above goal. Patient also requests referrals to interventional cardiology. Advised I will message Dr Jacques Navy. Patient voiced understanding.  Continue atorvastatin 80mg  Saunders Recheck lipid panel If LDL above goal, complete Repatha PA and recheck lipid panel in 3 months  Laural Golden, PharmD, BCACP, CDCES, CPP [address], [address] [place], [place], [address] Phone: [phone], Fax: [phone]

## 2023-02-19 LAB — HEPATIC FUNCTION PANEL
AST: 16 IU/L (ref 0–40)
Albumin: 4.4 g/dL (ref 3.8–4.8)

## 2023-02-19 LAB — LIPID PANEL: Chol/HDL Ratio: 4.2 ratio (ref 0.0–5.0)

## 2023-02-27 ENCOUNTER — Telehealth: Payer: Self-pay | Admitting: Internal Medicine

## 2023-02-27 ENCOUNTER — Ambulatory Visit (INDEPENDENT_AMBULATORY_CARE_PROVIDER_SITE_OTHER): Payer: 59 | Admitting: Nurse Practitioner

## 2023-02-27 ENCOUNTER — Encounter: Payer: Self-pay | Admitting: Nurse Practitioner

## 2023-02-27 VITALS — BP 116/68 | HR 54 | Temp 97.0°F | Wt 178.4 lb

## 2023-02-27 DIAGNOSIS — R072 Precordial pain: Secondary | ICD-10-CM

## 2023-02-27 DIAGNOSIS — R001 Bradycardia, unspecified: Secondary | ICD-10-CM

## 2023-02-27 DIAGNOSIS — I2510 Atherosclerotic heart disease of native coronary artery without angina pectoris: Secondary | ICD-10-CM

## 2023-02-27 DIAGNOSIS — R42 Dizziness and giddiness: Secondary | ICD-10-CM | POA: Diagnosis not present

## 2023-02-27 NOTE — Assessment & Plan Note (Addendum)
Chronic, ongoing.  He has been having ongoing orthostatic dizziness for several years.  He went to cardiology and had a event monitor for 2 weeks recently and it was negative except for a short run of SVT that was 7 beats.  He has a stress test that is going to be coming up here soon.  He has had a CT of his head and neck which were negative, along with labs.  Dix-Hallpike maneuver negative.  He did get dizzy when he stood up for his orthostatic vital signs where his systolic did drop 10 points.  Discussed wearing compression socks during the day and making sure he is drinking at least 64 ounces of water daily.  Also discussed changing positions slowly.  Encouraged him to follow-up with his PCP.

## 2023-02-27 NOTE — Progress Notes (Signed)
Acute Office Visit  Subjective:     Patient ID: Fernando Saunders, male    DOB: 1941-03-23, 82 y.o.   MRN: [id number]  Chief Complaint  Patient presents with   Dizziness    With headaches for years-gradually getting worse, SOB with walking, fatigue    HPI Patient is in today for ongoing dizziness with headaches when he bends over and stands back up.  He states this has been going on for several years.  He also notes that he will sometimes get short of breath when he walks long distances.  He denies fevers, swelling in his legs, chest pain.  He states that the dizziness will last 30 seconds to a minute after he goes from bending over to standing up.  He is seeing cardiology and his PCP for this.  He is wondering if there is any medications that he can take that would make this better.  ROS See pertinent positives and negatives per HPI.     Objective:    BP 116/68 (BP Location: Left Arm)   Pulse (!) 54   Temp (!) 97 F (36.1 C)   Wt 178 lb 6.4 oz (80.9 kg)   SpO2 98%   BMI 24.20 kg/m    Orthostatic VS for the past 72 hrs (Last 3 readings):  Orthostatic BP Patient Position BP Location Orthostatic Pulse  02/27/23 1146 110/80 Standing Right Arm 79  02/27/23 1145 120/62 Sitting Right Arm 62  02/27/23 1144 118/70 Supine Right Arm 61    Physical Exam Vitals and nursing note reviewed.  Constitutional:      Appearance: Normal appearance.  HENT:     Head: Normocephalic.  Eyes:     Conjunctiva/sclera: Conjunctivae normal.     Pupils: Pupils are equal, round, and reactive to light.  Cardiovascular:     Rate and Rhythm: Normal rate and regular rhythm.     Pulses: Normal pulses.     Heart sounds: Normal heart sounds.  Pulmonary:     Effort: Pulmonary effort is normal.     Breath sounds: Normal breath sounds.  Musculoskeletal:        General: No swelling. Normal range of motion.     Cervical back: Normal range of motion.     Right lower leg: No edema.     Left lower leg: No  edema.  Skin:    General: Skin is warm.  Neurological:     General: No focal deficit present.     Mental Status: He is alert and oriented to person, place, and time.     Motor: No weakness.     Coordination: Coordination normal.     Gait: Gait normal.     Comments: Dix hallpike negative  Psychiatric:        Mood and Affect: Mood normal.        Behavior: Behavior normal.        Thought Content: Thought content normal.        Judgment: Judgment normal.       Assessment & Plan:   Problem List Items Addressed This Visit       Other   Dizziness - Primary    Chronic, ongoing.  He has been having ongoing orthostatic dizziness for several years.  He went to cardiology and had a event monitor for 2 weeks recently and it was negative except for a short run of SVT that was 7 beats.  He has a stress test that is going to be coming  up here soon.  He has had a CT of his head and neck which were negative, along with labs.  Dix-Hallpike maneuver negative.  He did get dizzy when he stood up for his orthostatic vital signs where his systolic did drop 10 points.  Discussed wearing compression socks during the day and making sure he is drinking at least 64 ounces of water daily.  Also discussed changing positions slowly.  Encouraged him to follow-up with his PCP.       No orders of the defined types were placed in this encounter.   Return if symptoms worsen or fail to improve.  Gerre Scull, NP

## 2023-02-27 NOTE — Telephone Encounter (Signed)
Daughter is calling about having the stress test done for patient. Please advise

## 2023-02-27 NOTE — Telephone Encounter (Signed)
Patient was seen in June.  He was offered a stress test but declined.  Since June to now, she states "he has been feeling different".  Has dizziness and complaints as had before in June. Advised would send message to provider.  She states saw the pharmacist the other day and he was to make note.  She states another test was to be done as well but unsure of the name.  Please advise

## 2023-02-27 NOTE — Patient Instructions (Signed)
It was great to see you!  Start wearing compression socks daily. Wear them in the day and take them off at night.   Keep drinking 4 bottles of water daily.   Let's follow-up with Dr. Yetta Barre in a month  Take care,  Rodman Pickle, NP

## 2023-02-28 NOTE — Addendum Note (Signed)
Addended by: Bernita Buffy on: 02/28/2023 09:36 AM   Modules accepted: Orders

## 2023-02-28 NOTE — Addendum Note (Signed)
Addended by: Weston Brass A on: 02/28/2023 11:01 AM   Modules accepted: Orders

## 2023-02-28 NOTE — Telephone Encounter (Signed)
Returned pt call to let him know that Dr. Jacques Navy is ordering a cardiac PET stress test.  Pt wife verbalized understanding.

## 2023-02-28 NOTE — Telephone Encounter (Signed)
The patient was contacted using [employer] to schedule the Cardiac PET scan. There was no answer, so they LVM. Waiting on the patient to return call.

## 2023-03-11 ENCOUNTER — Inpatient Hospital Stay (HOSPITAL_COMMUNITY): Admission: RE | Admit: 2023-03-11 | Payer: 59 | Source: Ambulatory Visit

## 2023-03-11 ENCOUNTER — Telehealth: Payer: Self-pay | Admitting: Internal Medicine

## 2023-03-11 NOTE — Telephone Encounter (Signed)
Patient's daughter is calling to get update on PET CT that was cancelled. Requesting call back to get update on authorization of this test and scheduling.

## 2023-03-12 NOTE — Telephone Encounter (Signed)
Sent to preoperative pool by mistake.  Forwarding to you for review.  Thomasene Ripple. Kaylynne Andres NP-C     03/12/2023, 8:45 AM [facility] [address] Office [phone] Fax [phone]

## 2023-03-18 ENCOUNTER — Encounter: Payer: Self-pay | Admitting: Internal Medicine

## 2023-03-24 ENCOUNTER — Encounter (HOSPITAL_COMMUNITY): Payer: Self-pay

## 2023-03-25 ENCOUNTER — Telehealth (HOSPITAL_COMMUNITY): Payer: Self-pay | Admitting: *Deleted

## 2023-03-25 NOTE — Telephone Encounter (Signed)
Received call from daughter regarding upcoming cardiac imaging study; pt verbalizes understanding of appt date/time, parking situation and where to check in, pre-test NPO status and medications ordered, and verified current allergies; name and call back number provided for further questions should they arise Johney Frame RN Navigator Cardiac Imaging [facility] [phone] office [phone] cell  Daughter aware to avoid caffeine for 12 hours prior.

## 2023-03-25 NOTE — Telephone Encounter (Signed)
Attempted to call patient regarding upcoming cardiac PET appointment. Left message on voicemail with name and callback number Johney Frame RN Navigator Cardiac Imaging [facility] [phone] Office

## 2023-03-26 ENCOUNTER — Encounter (HOSPITAL_COMMUNITY)
Admission: RE | Admit: 2023-03-26 | Discharge: 2023-03-26 | Disposition: A | Discharge status: Home / Self Care | Payer: 59 | Source: Ambulatory Visit | Attending: Internal Medicine | Admitting: Internal Medicine

## 2023-03-26 DIAGNOSIS — R072 Precordial pain: Secondary | ICD-10-CM | POA: Diagnosis not present

## 2023-03-26 DIAGNOSIS — I2510 Atherosclerotic heart disease of native coronary artery without angina pectoris: Secondary | ICD-10-CM | POA: Diagnosis not present

## 2023-03-26 DIAGNOSIS — R001 Bradycardia, unspecified: Secondary | ICD-10-CM | POA: Diagnosis not present

## 2023-03-26 DIAGNOSIS — I700 Atherosclerosis of aorta: Secondary | ICD-10-CM | POA: Diagnosis not present

## 2023-03-26 MED ORDER — RUBIDIUM RB82 GENERATOR (RUBYFILL)
21.5000 | PACK | Freq: Once | INTRAVENOUS | Status: AC
  Administered 2023-03-26: 21.5 via INTRAVENOUS

## 2023-03-26 MED ORDER — REGADENOSON 0.4 MG/5ML IV SOLN
INTRAVENOUS | Status: AC
  Filled 2023-03-26: qty 5

## 2023-03-26 MED ORDER — REGADENOSON 0.4 MG/5ML IV SOLN
0.4000 mg | Freq: Once | INTRAVENOUS | Status: AC
  Administered 2023-03-26: 0.4 mg via INTRAVENOUS

## 2023-04-02 LAB — NM PET CT CARDIAC PERFUSION MULTI W/ABSOLUTE BLOODFLOW
MBFR: 2.63
Nuc Rest EF: 40 %
Nuc Stress EF: 53 %
Rest MBF: 0.64 ml/g/min
Rest Nuclear Isotope Dose: 21.5 mCi
ST Depression (mm): 0 mm
Stress MBF: 1.68 ml/g/min
Stress Nuclear Isotope Dose: 21.5 mCi

## 2023-04-03 ENCOUNTER — Other Ambulatory Visit (HOSPITAL_COMMUNITY): Payer: Self-pay | Admitting: *Deleted

## 2023-04-03 DIAGNOSIS — R0789 Other chest pain: Secondary | ICD-10-CM

## 2023-04-03 DIAGNOSIS — R072 Precordial pain: Secondary | ICD-10-CM

## 2023-04-17 ENCOUNTER — Ambulatory Visit (HOSPITAL_COMMUNITY): Payer: 59 | Attending: Internal Medicine

## 2023-04-17 DIAGNOSIS — R0789 Other chest pain: Secondary | ICD-10-CM | POA: Insufficient documentation

## 2023-04-17 DIAGNOSIS — R072 Precordial pain: Secondary | ICD-10-CM | POA: Diagnosis not present

## 2023-04-17 LAB — ECHOCARDIOGRAM COMPLETE
Area-P 1/2: 2.68 cm2
Est EF: 55
P 1/2 time: 706 ms
S' Lateral: 3.4 cm

## 2023-04-22 ENCOUNTER — Other Ambulatory Visit (HOSPITAL_COMMUNITY): Payer: Self-pay

## 2023-04-22 DIAGNOSIS — I340 Nonrheumatic mitral (valve) insufficiency: Secondary | ICD-10-CM

## 2023-04-22 DIAGNOSIS — I2510 Atherosclerotic heart disease of native coronary artery without angina pectoris: Secondary | ICD-10-CM

## 2023-04-22 DIAGNOSIS — E785 Hyperlipidemia, unspecified: Secondary | ICD-10-CM

## 2023-05-10 NOTE — Progress Notes (Unsigned)
Cardiology Office Note:  .   Date:  05/10/2023  ID:  Fernando Saunders, DOB 1940/09/25, MRN [id number] PCP: Etta Grandchild, MD   HeartCare Providers Cardiologist:  Parke Poisson, MD Electrophysiologist:  Lanier Prude, MD  History of Present Illness: .   Fernando Saunders is a 82 y.o. male with past medical history of nonobstructive CAD on coronary CTA, dizziness, bradycardia, hyperlipidemia, GERD, diverticulosis.  Patient is followed by Dr Jacques Navy and presents today for a 6 month follow up appointment.   Per chart review, patient has a history of resting bradycardia and intermittent dizziness.  He was seen in the ED in 12/2019 for dizziness, noted to be bradycardic with heart rate to the 40s.  He was referred to cardiology and seen by Dr. Jacques Navy in 01/2020.  Underwent echocardiogram on 03/30/2020 that showed EF 50-55%, no regional wall motion abnormalities, grade 1 DD, normal RV function, mild mitral valve regurgitation, mild aortic valve regurgitation.  Also wore a cardiac monitor that showed minimum heart rate 41 bpm, maximal heart rate 146 bpm, 116 beat run of NSVT that was asymptomatic.  He was referred to EP at the patient's request, seen by Dr. Lalla Brothers who did not feel pacemaker was necessary at that time.  An office visit in 05/2020, patient reported chest pain.  Underwent coronary CTA on 10/06/2020 that showed a coronary calcium score of 174 (58th percentile), moderate stenosis in the LAD system.  [facility] which showed borderline evidence of significant functional stenosis in the distal LAD.  Patient was seen in the office on 08/18/2022 for complaint of intermittent dizziness.  Wore a cardiac monitor that showed no pauses, AV block, NSVT, or malignant arrhythmia.  Patient was bradycardic about 6% of the time.  Patient was last seen by Dr. Jacques Navy on 12/23/22. At that time, patient continued to report intermittent dizziness, mostly occurred when bending over and standing  up.  He also reported occasional heaviness in his chest that have been going on for years.  Symptoms are unchanged from when he had his coronary CTA in 09/2020.  For further evaluation, underwent cardiac PET scan on 03/26/2023 that showed normal LV perfusion, no evidence of ischemia, no evidence of infarction. Overall, study was low risk.  EF was estimated around 40%.  For further evaluation, underwent echocardiogram on 04/17/2023 that showed EF 55%, no regional wall motion abnormalities, normal RV function, mild-moderate mitral valve regurgitation.  Recommended repeat echo in 1 year for monitoring of mitral valve regurgitation.   Dizziness  History of Sinus Bradycardia  - Patient has had intermittent dizziness for years. Most recent cardiac monitor from 09/2022 showed 6% burden of sinus bradycardia, minimum HR 40 BPM.  - Patient was seen by EP in the past- felt PPM was not indicated  - Instructed him to increase hydration, wear compression stockings, make position changes slowly to prevent dizziness   Chest Pain  Non-obstructive CAD  - Patient previously underwent coronary CTA in 09/2020 that showed a coronary calcium score of 174 (58th percentile for age and sex) and moderate CAD in the LAD system. FFR was negative.  - More recently underwent cardiac PET in 03/2023 that was a normal, low risk study. NO evidence of infarction or ischemia. Echocardiogram in 04/2023 showed EF 55%, no wall motion abnormalities  - Continue ASA 81 mg daily, lipitor 80 mg daily  HLD  - Lipid panel from 01/2023 showed LDL 64, HDL 29, triglycerides 166, total cholesterol 122  - Continue  lipitor 80 mg daily   Mild-moderate MR  - Noted on echocardiogram from 04/2023  - Plan for repeat echo in 1 year- has already been ordered by Dr. Jacques Navy   ROS: ***  Studies Reviewed: .   Cardiac Studies & Procedures     STRESS TESTS  NM PET CT CARDIAC PERFUSION MULTI W/ABSOLUTE BLOODFLOW 03/26/2023  Narrative   LV perfusion is  normal. There is no evidence of ischemia. There is no evidence of infarction.   Rest left ventricular function is abnormal. Rest EF: 40%. Stress left ventricular function is normal. Stress EF: 53%. End diastolic cavity size is normal. End systolic cavity size is normal.   Myocardial blood flow was computed to be 0.25ml/g/min at rest and 1.41ml/g/min at stress. Global myocardial blood flow reserve was 2.63 and was normal.   Coronary calcium was present on the attenuation correction CT images. Moderate coronary calcifications were present. Coronary calcifications were present in the left anterior descending artery distribution(s).   Initial perfusion images showed reversible lateral wall perfusion defect concerning for lateral ischemia.  However, flow reserve was normal globally and in each coronary distribution.  On closer examination appeared to be CT misregistration artifact.  This was corrected with resoluation of perfusion defect.   The study is normal. The study is low risk.   Electronically signed by Epifanio Lesches, MD  CLINICAL DATA:  This over-read does not include interpretation of cardiac or coronary anatomy or pathology. The Cardiac PET CT interpretation by the cardiologist is attached.  COMPARISON:  09/16/2022  FINDINGS: Cardiovascular: Aortic atherosclerosis. Normal heart size. Left coronary artery calcifications. No pericardial effusion.  Limited Mediastinum/Nodes: No enlarged mediastinal, hilar, or axillary lymph nodes. Trachea and esophagus demonstrate no significant findings.  Limited Lungs/Pleura: Lungs are clear. No pleural effusion or pneumothorax.  Upper Abdomen: No acute abnormality.  Musculoskeletal: No chest wall abnormality. No acute osseous findings.  IMPRESSION: 1. No acute CT findings of the chest. 2. Coronary artery disease.  Aortic Atherosclerosis (ICD10-I70.0).   Electronically Signed By: Jearld Lesch M.D. On: 03/26/2023 11:19    ECHOCARDIOGRAM  ECHOCARDIOGRAM COMPLETE 04/17/2023  Narrative ECHOCARDIOGRAM REPORT    Patient Name:   Fernando Saunders Date of Exam: 04/17/2023 Medical Rec #:  [id number]      Height:       72.0 in Accession #:    [id number]     Weight:       178.4 lb Date of Birth:  1940-11-03     BSA:          2.030 m Patient Age:    42 years       BP:           116/68 mmHg Patient Gender: M              HR:           56 bpm. Exam Location:  [address]  Procedure: 2D Echo, 3D Echo, Cardiac Doppler and Color Doppler  Indications:    R07.9 Chest Pain  History:        Patient has prior history of Echocardiogram examinations, most recent 03/03/2020. Signs/Symptoms:Chest Pain, Dizziness/Lightheadedness, Fatigue and Shortness of Breath; Risk Factors:Hypertension and Dyslipidemia.  Sonographer:    Farrel Conners RDCS Referring Phys: Parke Poisson  IMPRESSIONS   1. Left ventricular ejection fraction, by estimation, is 55%. The left ventricle has normal function. The left ventricle has no regional wall motion abnormalities. Left ventricular diastolic parameters were normal. 2. Right ventricular systolic function  is normal. The right ventricular size is normal. 3. The mitral valve is abnormal. Mild to moderate mitral valve regurgitation. No evidence of mitral stenosis. There is mild late systolic prolapse of posterior leaflet of the mitral valve. 4. The aortic valve is tricuspid. There is mild calcification of the aortic valve. Aortic valve regurgitation is mild. No aortic stenosis is present. 5. The inferior vena cava is normal in size with greater than 50% respiratory variability, suggesting right atrial pressure of 3 mmHg.  FINDINGS Left Ventricle: Left ventricular ejection fraction, by estimation, is 55%. The left ventricle has normal function. The left ventricle has no regional wall motion abnormalities. The left ventricular internal cavity size was normal in size. There is no left  ventricular hypertrophy. Left ventricular diastolic parameters were normal.  Right Ventricle: The right ventricular size is normal. No increase in right ventricular wall thickness. Right ventricular systolic function is normal.  Left Atrium: Left atrial size was normal in size.  Right Atrium: Right atrial size was normal in size.  Pericardium: There is no evidence of pericardial effusion.  Mitral Valve: The mitral valve is abnormal. There is mild late systolic prolapse of posterior leaflet of the mitral valve. Mild to moderate mitral valve regurgitation. No evidence of mitral valve stenosis.  Tricuspid Valve: The tricuspid valve is normal in structure. Tricuspid valve regurgitation is trivial. No evidence of tricuspid stenosis.  Aortic Valve: The aortic valve is tricuspid. There is mild calcification of the aortic valve. Aortic valve regurgitation is mild. Aortic regurgitation PHT measures 706 msec. No aortic stenosis is present.  Pulmonic Valve: The pulmonic valve was normal in structure. Pulmonic valve regurgitation is trivial. No evidence of pulmonic stenosis.  Aorta: The aortic root is normal in size and structure.  Venous: The inferior vena cava is normal in size with greater than 50% respiratory variability, suggesting right atrial pressure of 3 mmHg.  IAS/Shunts: No atrial level shunt detected by color flow Doppler.   LEFT VENTRICLE PLAX 2D LVIDd:         4.53 cm   Diastology LVIDs:         3.40 cm   LV e' medial:    7.72 cm/s LV PW:         0.77 cm   LV E/e' medial:  7.2 LV IVS:        0.77 cm   LV e' lateral:   9.68 cm/s LVOT diam:     2.50 cm   LV E/e' lateral: 5.8 LV SV:         75 LV SV Index:   37 LVOT Area:     4.91 cm  3D Volume EF: 3D EF:        59 % LV EDV:       182 ml LV ESV:       74 ml LV SV:        108 ml  RIGHT VENTRICLE RV Basal diam:  3.30 cm RV S prime:     12.10 cm/s TAPSE (M-mode): 2.4 cm  LEFT ATRIUM             Index        RIGHT ATRIUM            Index LA diam:        3.70 cm 1.82 cm/m   RA Pressure: 3.00 mmHg LA Vol (A2C):   86.9 ml 42.82 ml/m  RA Area:     17.90 cm LA Vol (A4C):  56.3 ml 27.74 ml/m  RA Volume:   50.70 ml  24.98 ml/m LA Biplane Vol: 73.6 ml 36.26 ml/m AORTIC VALVE LVOT Vmax:   60.50 cm/s LVOT Vmean:  39.550 cm/s LVOT VTI:    0.152 m AI PHT:      706 msec  AORTA Ao Root diam: 3.50 cm Ao Asc diam:  3.70 cm  MITRAL VALVE               TRICUSPID VALVE MV Area (PHT)  cm         Estimated RAP:  3.00 mmHg MV Decel Time: 284 msec MV E velocity: 55.90 cm/s  SHUNTS MV A velocity: 46.15 cm/s  Systemic VTI:  0.15 m MV E/A ratio:  1.21        Systemic Diam: 2.50 cm  Arvilla Meres MD Electronically signed by Arvilla Meres MD Signature Date/Time: 04/17/2023/10:09:41 AM    Final    MONITORS  LONG TERM MONITOR (3-14 DAYS) 10/15/2022  Narrative Indication: Dizziness  Minimum HR (bpm): 40, rates <50bpm are 6% burden. Maximum HR (bpm): 148  Supraventricular Ectopy: rare <1% SVT: 5 episodes longest 7 beats rate 113, fastest 4 beats rate 148 bpm  Ventricular Ectopy: rare <1% NSVT: none Ventricular Tachycardia: none  Pauses: none AV block: none  Atrial fibrillation: none  Diary events: Patient triggered monitor without diary description for sinus rhythm, one trigger for ventricular ectopy and one trigger with sinus bradycardia rate 54 bpm  Impression No pauses, AVB, NSVT or malignant arrhythmia. 6% burden of bradycardia.  Patch Wear Time:  13 days and 23 hours (2024-03-22T20:12:54-0400 to 2024-04-05T20:12:50-0400)  Patient had a min HR of 40 bpm, max HR of 148 bpm, and avg HR of 60 bpm. Predominant underlying rhythm was Sinus Rhythm. 5 Supraventricular Tachycardia runs occurred, the run with the fastest interval lasting 4 beats with a max rate of 148 bpm, the longest lasting 7 beats with an avg rate of 113 bpm. Isolated SVEs were rare (<1.0%), SVE Couplets were rare (<1.0%), and SVE  Triplets were rare (<1.0%). Isolated VEs were rare (<1.0%, 3033), VE Couplets were rare (<1.0%, 14), and VE Triplets were rare (<1.0%, 1). Ventricular Bigeminy was present.   CT SCANS  CT CORONARY MORPH W/CTA COR W/SCORE 10/06/2020  Addendum 10/06/2020 11:37 AM ADDENDUM REPORT: 10/06/2020 11:35  CLINICAL DATA:  82 Year old Hispanic Male  EXAM: Cardiac/Coronary  CTA  TECHNIQUE: The patient was scanned on a [employer].  FINDINGS: A 100 kV prospective scan was triggered in the descending thoracic aorta at 111 HU's. Axial non-contrast 3 mm slices were carried out through the heart. The data set was analyzed on a dedicated work station and scored using the Agatson method. Gantry rotation speed was 250 msecs and collimation was .6 mm. No beta blockade and 0.8 mg of sl NTG was given. The 3D data set was reconstructed in 5% intervals of the 67-82 % of the R-R cycle. Diastolic phases were analyzed on a dedicated work station using MPR, MIP and VRT modes. The patient received 90 cc of contrast.  Aorta: Normal size. Ascending aortic atherosclerosis noted. No dissection.  Aortic Valve:  Tri-leaflet.  Minimal annular calcification.  Coronary Arteries:  Normal coronary origin.  Right dominance.  Coronary Calcium Score:  Left main: 44  Left anterior descending artery: 130  Left circumflex artery: 0  Right coronary artery: 0  Total: 174  Percentile: 58th for age, sex, and race matched control.  RCA is a large dominant artery  that gives rise to PDA and PLA. There is no plaque.  Left main is a large artery that gives rise to LAD and LCX arteries. There is a distal calcific plaque < 10%.  LAD is a large vessel that gives rise to one large D1 Branch and a smaller D2 vessel. There is a mild non obstructive (25-49%) calcific plaque in the ostial vessel. There is a mild non obstructive (25-49%) calcific plaque in the proximal vessel. There is a moderate stenosis (50-70%)  calcific plaque in the mid vessel.  LCX is a non-dominant artery that gives rise to two OM vessels. There is no plaque.  Other findings:  Normal pulmonary vein drainage into the left atrium.  Normal left atrial appendage without a thrombus.  Normal size of the pulmonary artery.  Cannot rule out small patent foramen ovale.  Extra-cardiac findings: See attached radiology report for non-cardiac structures.  IMPRESSION: 1. Coronary calcium score of 174. This was 58th percentile for age, sex, and race matched control.  2. Normal coronary origin with Right dominance.  3. CAD-RADS 3. Moderate stenosis in LAD system. Consider symptom-guided anti-ischemic pharmacotherapy as well as risk factor modification per guideline directed care. Additional analysis with CT FFR will be submitted.  4. Ascending aortic atherosclerosis noted.  5. Cannot rule out small patent foramen ovale.  RECOMMENDATIONS:  Coronary artery calcium (CAC) score is a strong predictor of incident coronary heart disease (CHD) and provides predictive information beyond traditional risk factors. CAC scoring is reasonable to use in the decision to withhold, postpone, or initiate statin therapy in intermediate-risk or selected borderline-risk asymptomatic adults (age 42-75 years and LDL-C ?70 to <190 mg/dL) who do not have diabetes or established atherosclerotic cardiovascular disease (ASCVD).* In intermediate-risk (10-year ASCVD risk ?7.5% to <20%) adults or selected borderline-risk (10-year ASCVD risk ?5% to <7.5%) adults in whom a CAC score is measured for the purpose of making a treatment decision the following recommendations have been made:  If CAC = 0, it is reasonable to withhold statin therapy and reassess in 5 to 10 years, as long as higher risk conditions are absent (diabetes mellitus, family history of premature CHD in first degree relatives (males <55 years; females <65 years), cigarette smoking, LDL  ?190 mg/dL or other independent risk factors).  If CAC is 1 to 99, it is reasonable to initiate statin therapy for patients ?79 years of age.  If CAC is ?100 or ?75th percentile, it is reasonable to initiate statin therapy at any age.  Cardiology referral should be considered for patients with CAC scores ?400 or ?75th percentile.  *2018 AHA/ACC/AACVPR/AAPA/ABC/ACPM/ADA/AGS/APhA/ASPC/NLA/PCNA Guideline on the Management of Blood Cholesterol: A Report of the American College of Cardiology/American Heart Association Task Force on Clinical Practice Guidelines. J Am Coll Cardiol. 2019;73(24):3168-3209.  Riley Lam, MD   Electronically Signed By: Riley Lam MD On: 10/06/2020 11:35  Narrative EXAM: OVER-READ INTERPRETATION  CT CHEST  The following report is an over-read performed by radiologist Dr. Trudie Reed of [facility] on 10/06/2020. This over-read does not include interpretation of cardiac or coronary anatomy or pathology. The coronary calcium score/coronary CTA interpretation by the cardiologist is attached.  COMPARISON:  None.  FINDINGS: Aortic atherosclerosis. Within the visualized portions of the thorax there are no suspicious appearing pulmonary nodules or masses, there is no acute consolidative airspace disease, no pleural effusions, no pneumothorax and no lymphadenopathy. Visualized portions of the upper abdomen are unremarkable. There are no aggressive appearing lytic or blastic lesions noted  in the visualized portions of the skeleton.  IMPRESSION: 1.  Aortic Atherosclerosis (ICD10-I70.0).  Electronically Signed: By: Trudie Reed M.D. On: 10/06/2020 08:18          Risk Assessment/Calculations:   {Does this patient have ATRIAL FIBRILLATION?[phone]} No BP recorded.  {Refresh Note OR Click here to enter BP  :1}***       Physical Exam:   VS:  There were no vitals taken for this visit.   Wt Readings from Last  3 Encounters:  02/27/23 178 lb 6.4 oz (80.9 kg)  12/23/22 183 lb 12.8 oz (83.4 kg)  09/18/22 185 lb 6.4 oz (84.1 kg)    GEN: Well nourished, well developed in no acute distress NECK: No JVD; No carotid bruits CARDIAC: ***RRR, no murmurs, rubs, gallops RESPIRATORY:  Clear to auscultation without rales, wheezing or rhonchi  ABDOMEN: Soft, non-tender, non-distended EXTREMITIES:  No edema; No deformity   ASSESSMENT AND PLAN: .   ***    {Are you ordering a CV Procedure (e.g. stress test, cath, DCCV, TEE, etc)?   Press F2        :[id number]}  Dispo: ***  Signed, Jonita Albee, PA-C

## 2023-05-12 ENCOUNTER — Ambulatory Visit: Payer: 59 | Attending: Cardiology | Admitting: Cardiology

## 2023-05-12 ENCOUNTER — Encounter: Payer: Self-pay | Admitting: Cardiology

## 2023-05-12 VITALS — BP 110/64 | HR 62 | Ht 72.0 in | Wt 182.0 lb

## 2023-05-12 DIAGNOSIS — R001 Bradycardia, unspecified: Secondary | ICD-10-CM

## 2023-05-12 DIAGNOSIS — R42 Dizziness and giddiness: Secondary | ICD-10-CM

## 2023-05-12 DIAGNOSIS — E785 Hyperlipidemia, unspecified: Secondary | ICD-10-CM

## 2023-05-12 DIAGNOSIS — R079 Chest pain, unspecified: Secondary | ICD-10-CM | POA: Diagnosis not present

## 2023-05-12 DIAGNOSIS — I340 Nonrheumatic mitral (valve) insufficiency: Secondary | ICD-10-CM | POA: Diagnosis not present

## 2023-05-12 DIAGNOSIS — I2510 Atherosclerotic heart disease of native coronary artery without angina pectoris: Secondary | ICD-10-CM

## 2023-05-12 NOTE — Patient Instructions (Addendum)
Medication Instructions:  No changes *If you need a refill on your cardiac medications before your next appointment, please call your pharmacy*  Lab Work: No labs  Testing/Procedures: No testing  Follow-Up: At [facility], you and your health needs are our priority.  As part of our continuing mission to provide you with exceptional heart care, we have created designated Provider Care Teams.  These Care Teams include your primary Cardiologist (physician) and Advanced Practice Providers (APPs -  Physician Assistants and Nurse Practitioners) who all work together to provide you with the care you need, when you need it.  We recommend signing up for the patient portal called "MyChart".  Sign up information is provided on this After Visit Summary.  MyChart is used to connect with patients for Virtual Visits (Telemedicine).  Patients are able to view lab/test results, encounter notes, upcoming appointments, etc.  Non-urgent messages can be sent to your provider as well.   To learn more about what you can do with MyChart, go to [email or web address].    Your next appointment:   5-6 Months  Provider:   Parke Poisson, MD

## 2023-05-16 DIAGNOSIS — H34811 Central retinal vein occlusion, right eye, with macular edema: Secondary | ICD-10-CM | POA: Diagnosis not present

## 2023-05-27 ENCOUNTER — Ambulatory Visit: Payer: 59 | Admitting: Student

## 2023-06-23 ENCOUNTER — Ambulatory Visit: Payer: 59 | Admitting: Student

## 2023-08-15 ENCOUNTER — Ambulatory Visit: Payer: 59

## 2023-08-15 VITALS — BP 130/80 | HR 62 | Ht 71.0 in | Wt 181.8 lb

## 2023-08-15 DIAGNOSIS — H43813 Vitreous degeneration, bilateral: Secondary | ICD-10-CM | POA: Diagnosis not present

## 2023-08-15 DIAGNOSIS — H35033 Hypertensive retinopathy, bilateral: Secondary | ICD-10-CM | POA: Diagnosis not present

## 2023-08-15 DIAGNOSIS — Z0000 Encounter for general adult medical examination without abnormal findings: Secondary | ICD-10-CM | POA: Diagnosis not present

## 2023-08-15 DIAGNOSIS — Z961 Presence of intraocular lens: Secondary | ICD-10-CM | POA: Diagnosis not present

## 2023-08-15 DIAGNOSIS — H34811 Central retinal vein occlusion, right eye, with macular edema: Secondary | ICD-10-CM | POA: Diagnosis not present

## 2023-08-15 DIAGNOSIS — H35371 Puckering of macula, right eye: Secondary | ICD-10-CM | POA: Diagnosis not present

## 2023-08-15 NOTE — Progress Notes (Signed)
Subjective:   Fernando Saunders is a 83 y.o. male who presents for Medicare Annual/Subsequent preventive examination.  Visit Complete: In person Cardiac Risk Factors include: advanced age (>79men, >50 women);dyslipidemia;hypertension;male gender     Objective:    Today's Vitals   08/15/23 1520  BP: 130/80  Pulse: 62  SpO2: 97%  Weight: 181 lb 12.8 oz (82.5 kg)  Height: 5\' 11"  (1.803 m)   Body mass index is 25.36 kg/m.     08/15/2023    3:14 PM 08/09/2022    4:17 PM 07/16/2021   10:15 AM 01/28/2020   12:43 PM 01/11/2017    2:00 PM  Advanced Directives  Does Patient Have a Medical Advance Directive? No No No No No  Would patient like information on creating a medical advance directive? Yes (MAU/Ambulatory/Procedural Areas - Information given) No - Patient declined No - Patient declined      Current Medications (verified) Outpatient Encounter Medications as of 08/15/2023  Medication Sig   aspirin EC 81 MG tablet Take 1 tablet (81 mg total) by mouth daily. Swallow whole.   atorvastatin (LIPITOR) 80 MG tablet Take 1 tablet (80 mg total) by mouth daily.   gabapentin (NEURONTIN) 100 MG capsule Take 1 capsule by mouth at bedtime   GEMTESA 75 MG TABS Take 1 tablet by mouth daily.   Multiple Vitamin (MULTIVITAMIN) tablet Take 1 tablet by mouth daily.   MYRBETRIQ 50 MG TB24 tablet Take 1 tablet by mouth daily.   omega-3 acid ethyl esters (LOVAZA) 1 g capsule Take by mouth as needed.   pantoprazole (PROTONIX) 40 MG tablet Take 1 tablet (40 mg total) by mouth daily.   tamsulosin (FLOMAX) 0.4 MG CAPS capsule Take 0.4 mg by mouth daily.   triamcinolone cream (KENALOG) 0.1 % Apply 1 application topically daily as needed.   No facility-administered encounter medications on file as of 08/15/2023.    Allergies (verified) Patient has no known allergies.   History: Past Medical History:  Diagnosis Date   Arthritis    Diverticulosis    GERD (gastroesophageal reflux disease)     Hyperlipidemia    Past Surgical History:  Procedure Laterality Date   COLONOSCOPY  11/04/2011   Dr. Stan Head   EYE SURGERY     Family History  Problem Relation Age of Onset   Diabetes Mother    Cancer Sister    Colon cancer Neg Hx    Stomach cancer Neg Hx    Heart attack Neg Hx    Social History   Socioeconomic History   Marital status: Married    Spouse name: Not on file   Number of children: 2   Years of education: Not on file   Highest education level: Not on file  Occupational History   Occupation: Unemployed  Tobacco Use   Smoking status: Never   Smokeless tobacco: Never  Vaping Use   Vaping status: Never Used  Substance and Sexual Activity   Alcohol use: Yes    Alcohol/week: 0.0 standard drinks of alcohol    Comment: occasional   Drug use: No   Sexual activity: Not on file  Other Topics Concern   Not on file  Social History Narrative   Not on file   Social Drivers of Health   Financial Resource Strain: Low Risk  (08/15/2023)   Overall Financial Resource Strain (CARDIA)    Difficulty of Paying Living Expenses: Not hard at all  Food Insecurity: No Food Insecurity (08/15/2023)   Hunger Vital Sign  Worried About Programme researcher, broadcasting/film/video in the Last Year: Never true    Ran Out of Food in the Last Year: Never true  Transportation Needs: No Transportation Needs (08/15/2023)   PRAPARE - Administrator, Civil Service (Medical): No    Lack of Transportation (Non-Medical): No  Physical Activity: Sufficiently Active (08/15/2023)   Exercise Vital Sign    Days of Exercise per Week: 5 days    Minutes of Exercise per Session: 60 min  Stress: No Stress Concern Present (08/15/2023)   [employer] of Occupational Health - Occupational Stress Questionnaire    Feeling of Stress : Not at all  Social Connections: Moderately Integrated (08/15/2023)   Social Connection and Isolation Panel [NHANES]    Frequency of Communication with Friends and Family: More than  three times a week    Frequency of Social Gatherings with Friends and Family: Once a week    Attends Religious Services: More than 4 times per year    Active Member of [employer] or Organizations: No    Attends Engineer, structural: Never    Marital Status: Married    Tobacco Counseling Counseling given: Not Answered   Clinical Intake:  Pre-visit preparation completed: Yes        BMI - recorded: 25.36 Nutritional Status: BMI 25 -29 Overweight Nutritional Risks: None Diabetes: No  How often do you need to have someone help you when you read instructions, pamphlets, or other written materials from your doctor or pharmacy?: 3 - Sometimes (if info is in Spanish able to read himself.  If info is in English needs assistance)  Interpreter Needed?: Yes (has an Equities trader for today's visit - Spanish) Interpreter Agency: Haroldine Laws - through [employer]  [facility]) Interpreter Name: Maylene Roes Patient Declined Interpreter : No Patient signed [place] waiver: No  Information entered by :: Hassell Halim, CMA   Activities of Daily Living    08/15/2023    3:29 PM  In your present state of health, do you have any difficulty performing the following activities:  Hearing? 0  Vision? 0  Difficulty concentrating or making decisions? 0  Walking or climbing stairs? 0  Dressing or bathing? 0  Doing errands, shopping? 0  Preparing Food and eating ? N  Using the Toilet? N  In the past six months, have you accidently leaked urine? Y  Comment urgency - sometimes  Do you have problems with loss of bowel control? N  Managing your Medications? N  Managing your Finances? Y  Comment dtr assist  Housekeeping or managing your Housekeeping? N    Patient Care Team: Etta Grandchild, MD as PCP - General (Internal Medicine) Lanier Prude, MD as PCP - Electrophysiology (Cardiology) Parke Poisson, MD as PCP - Cardiology (Cardiology)  Indicate any recent Medical Services you may  have received from other than [facility] providers in the past year (date may be approximate).     Assessment:   This is a routine wellness examination for [place].  Hearing/Vision screen Hearing Screening - Comments:: Denies hearing difficulties   Vision Screening - Comments:: Does not wear eyeglasses - does see an Ophthalmologist, Dr Dione Booze and Dr Levy Sjogren - Retina Specialist   Goals Addressed               This Visit's Progress     Patient Stated (pt-stated)        Patient stated he plans to continue to exercise and stay active.  Depression Screen    08/15/2023    3:36 PM 08/15/2022    9:25 AM 08/09/2022    4:17 PM 07/16/2021    9:57 AM 02/13/2021    4:20 PM 12/06/2019    2:38 PM 10/10/2014    9:25 AM  PHQ 2/9 Scores  PHQ - 2 Score 0 0 0 0 0 0 0    Fall Risk    08/15/2023    3:37 PM 08/15/2022    9:25 AM 08/09/2022    4:06 PM 07/16/2021    9:58 AM 02/13/2021    4:19 PM  Fall Risk   Falls in the past year? 0 0 0 0 0  Number falls in past yr: 0 0 0 0   Injury with Fall? 0 0 0 0   Risk for fall due to : No Fall Risks No Fall Risks No Fall Risks No Fall Risks   Follow up Falls prevention discussed;Falls evaluation completed Falls evaluation completed Falls prevention discussed Falls evaluation completed     MEDICARE RISK AT HOME: Medicare Risk at Home Any stairs in or around the home?: No If so, are there any without handrails?: No Home free of loose throw rugs in walkways, pet beds, electrical cords, etc?: Yes Adequate lighting in your home to reduce risk of falls?: Yes Life alert?: No Use of a cane, walker or w/c?: No Grab bars in the bathroom?: No Shower chair or bench in shower?: Yes Elevated toilet seat or a handicapped toilet?: No  TIMED UP AND GO:  Was the test performed?  No    Cognitive Function:        08/15/2023    3:40 PM 08/09/2022    4:15 PM  6CIT Screen  What Year? 0 points 0 points  What month? 0 points 0 points  What time? 0 points 0 points   Count back from 20 0 points 0 points  Months in reverse 0 points 0 points  Repeat phrase 0 points 0 points  Total Score 0 points 0 points    Immunizations Immunization History  Administered Date(s) Administered   Influenza, High Dose Seasonal PF 05/23/2022   Influenza,inj,Quad PF,6+ Mos 08/08/2014   Pneumococcal Conjugate-13 11/12/2013, 08/30/2015   Pneumococcal Polysaccharide-23 08/07/2010, 02/13/2021   Tdap 08/07/2010, 02/16/2021    TDAP status: Up to date - 02/16/2021  Flu Vaccine status: Due, Education has been provided regarding the importance of this vaccine. Advised may receive this vaccine at local pharmacy or Health Dept. Aware to provide a copy of the vaccination record if obtained from local pharmacy or Health Dept. Verbalized acceptance and understanding.  Pneumococcal vaccine status: Up to date - 02/13/2021  Covid-19 vaccine status: Information provided on how to obtain vaccines.   Qualifies for Shingles Vaccine? Yes   Zostavax completed No   Shingrix Completed?: No.    Education has been provided regarding the importance of this vaccine. Patient has been advised to call insurance company to determine out of pocket expense if they have not yet received this vaccine. Advised may also receive vaccine at local pharmacy or Health Dept. Verbalized acceptance and understanding.  Screening Tests Health Maintenance  Topic Date Due   COVID-19 Vaccine (1) Never done   Zoster Vaccines- Shingrix (1 of 2) Never done   INFLUENZA VACCINE  09/29/2023 (Originally 01/30/2023)   Medicare Annual Wellness (AWV)  08/14/2024   DTaP/Tdap/Td (3 - Td or Tdap) 02/17/2031   Pneumonia Vaccine 11+ Years old  Completed   HPV VACCINES  Aged Out   Colonoscopy  Discontinued   Hepatitis C Screening  Discontinued    Health Maintenance  Health Maintenance Due  Topic Date Due   COVID-19 Vaccine (1) Never done   Zoster Vaccines- Shingrix (1 of 2) Never done    Colorectal cancer screening: No  longer required. Age 86    Additional Screening:  Hepatitis C Screening: does not qualify; Completed 08/15/2022  Vision Screening: Recommended annual ophthalmology exams for early detection of glaucoma and other disorders of the eye. Is the patient up to date with their annual eye exam?  Yes  Who is the provider or what is the name of the office in which the patient attends annual eye exams? Dr Dione Booze; Dr Levy Sjogren (Retina Specialist) If pt is not established with a provider, would they like to be referred to a provider to establish care? No .   Dental Screening: Recommended annual dental exams for proper oral hygiene   Community Resource Referral / Chronic Care Management: CRR required this visit?  No   CCM required this visit?  No     Plan:     I have personally reviewed and noted the following in the patient's chart:   Medical and social history Use of alcohol, tobacco or illicit drugs  Current medications and supplements including opioid prescriptions. Patient is not currently taking opioid prescriptions. Functional ability and status Nutritional status Physical activity Advanced directives List of other physicians Hospitalizations, surgeries, and ER visits in previous 12 months Vitals Screenings to include cognitive, depression, and falls Referrals and appointments  In addition, I have reviewed and discussed with patient certain preventive protocols, quality metrics, and best practice recommendations. A written personalized care plan for preventive services as well as general preventive health recommendations were provided to patient.     Darreld Mclean, CMA   08/15/2023   After Visit Summary: (MyChart) Due to this being a telephonic visit, the after visit summary with patients personalized plan was offered to patient via MyChart   Nurse Notes: Advised to obtain the Shingles and COVID vaccines at the pharmacy.

## 2023-08-15 NOTE — Patient Instructions (Addendum)
Fernando Saunders , Thank you for taking time to come for your Medicare Wellness Visit. I appreciate your ongoing commitment to your health goals. Please review the following plan we discussed and let me know if I can assist you in the future.   Referrals/Orders/Follow-Ups/Clinician Recommendations: Aim for 30 minutes of exercise or brisk walking, 6-8 glasses of water, and 5 servings of fruits and vegetables each day. Advised to obtain a copy of Influenza vaccine report from pharmacy (in 2024).  Also advised to obtain the COVID and Shingles vaccines at the pharmacy.  This is a list of the screening recommended for you and due dates:  Health Maintenance  Topic Date Due   COVID-19 Vaccine (1) Never done   Zoster (Shingles) Vaccine (1 of 2) Never done   Flu Shot  09/29/2023*   Medicare Annual Wellness Visit  08/14/2024   DTaP/Tdap/Td vaccine (3 - Td or Tdap) 02/17/2031   Pneumonia Vaccine  Completed   HPV Vaccine  Aged Out   Colon Cancer Screening  Discontinued   Hepatitis C Screening  Discontinued  *Topic was postponed. The date shown is not the original due date.    Advanced directives: (Provided) Advance directive discussed with you today. I have provided a copy for you to complete at home and have notarized. Once this is complete, please bring a copy in to our office so we can scan it into your chart.   Next Medicare Annual Wellness Visit scheduled for next year: Yes - 08/2024

## 2023-08-25 DIAGNOSIS — H26491 Other secondary cataract, right eye: Secondary | ICD-10-CM | POA: Diagnosis not present

## 2023-08-25 DIAGNOSIS — H43812 Vitreous degeneration, left eye: Secondary | ICD-10-CM | POA: Diagnosis not present

## 2023-08-25 DIAGNOSIS — H04123 Dry eye syndrome of bilateral lacrimal glands: Secondary | ICD-10-CM | POA: Diagnosis not present

## 2023-08-25 DIAGNOSIS — H34811 Central retinal vein occlusion, right eye, with macular edema: Secondary | ICD-10-CM | POA: Diagnosis not present

## 2023-08-25 DIAGNOSIS — Z961 Presence of intraocular lens: Secondary | ICD-10-CM | POA: Diagnosis not present

## 2023-08-27 DIAGNOSIS — R3915 Urgency of urination: Secondary | ICD-10-CM | POA: Diagnosis not present

## 2023-09-18 ENCOUNTER — Ambulatory Visit (INDEPENDENT_AMBULATORY_CARE_PROVIDER_SITE_OTHER): Payer: 59 | Admitting: Internal Medicine

## 2023-09-18 ENCOUNTER — Encounter: Payer: Self-pay | Admitting: Internal Medicine

## 2023-09-18 VITALS — BP 134/70 | HR 53 | Temp 97.5°F | Resp 16 | Ht 71.0 in | Wt 183.8 lb

## 2023-09-18 DIAGNOSIS — K219 Gastro-esophageal reflux disease without esophagitis: Secondary | ICD-10-CM | POA: Diagnosis not present

## 2023-09-18 DIAGNOSIS — Z0000 Encounter for general adult medical examination without abnormal findings: Secondary | ICD-10-CM | POA: Diagnosis not present

## 2023-09-18 DIAGNOSIS — I10 Essential (primary) hypertension: Secondary | ICD-10-CM

## 2023-09-18 DIAGNOSIS — E785 Hyperlipidemia, unspecified: Secondary | ICD-10-CM | POA: Diagnosis not present

## 2023-09-18 DIAGNOSIS — D539 Nutritional anemia, unspecified: Secondary | ICD-10-CM

## 2023-09-18 DIAGNOSIS — N400 Enlarged prostate without lower urinary tract symptoms: Secondary | ICD-10-CM | POA: Diagnosis not present

## 2023-09-18 DIAGNOSIS — N1831 Chronic kidney disease, stage 3a: Secondary | ICD-10-CM

## 2023-09-18 DIAGNOSIS — R001 Bradycardia, unspecified: Secondary | ICD-10-CM

## 2023-09-18 DIAGNOSIS — Z0001 Encounter for general adult medical examination with abnormal findings: Secondary | ICD-10-CM

## 2023-09-18 LAB — LIPID PANEL
Cholesterol: 186 mg/dL (ref 0–200)
HDL: 33.1 mg/dL — ABNORMAL LOW (ref >39.00)
LDL Cholesterol: 106 mg/dL — ABNORMAL HIGH (ref 0–99)
NonHDL: 153.35
Total CHOL/HDL Ratio: 6
Triglycerides: 238 mg/dL — ABNORMAL HIGH (ref 0.0–149.0)
VLDL: 47.6 mg/dL — ABNORMAL HIGH (ref 0.0–40.0)

## 2023-09-18 LAB — CBC WITH DIFFERENTIAL/PLATELET
Basophils Absolute: 0 10*3/uL (ref 0.0–0.1)
Basophils Relative: 0.7 % (ref 0.0–3.0)
Eosinophils Absolute: 0.2 10*3/uL (ref 0.0–0.7)
Eosinophils Relative: 3.4 % (ref 0.0–5.0)
HCT: 38.7 % — ABNORMAL LOW (ref 39.0–52.0)
Hemoglobin: 12.8 g/dL — ABNORMAL LOW (ref 13.0–17.0)
Lymphocytes Relative: 33.2 % (ref 12.0–46.0)
Lymphs Abs: 2.2 10*3/uL (ref 0.7–4.0)
MCHC: 33.1 g/dL (ref 30.0–36.0)
MCV: 97.9 fl (ref 78.0–100.0)
Monocytes Absolute: 0.7 10*3/uL (ref 0.1–1.0)
Monocytes Relative: 10.8 % (ref 3.0–12.0)
Neutro Abs: 3.4 10*3/uL (ref 1.4–7.7)
Neutrophils Relative %: 51.9 % (ref 43.0–77.0)
Platelets: 204 10*3/uL (ref 150.0–400.0)
RBC: 3.95 Mil/uL — ABNORMAL LOW (ref 4.22–5.81)
RDW: 12.6 % (ref 11.5–15.5)
WBC: 6.5 10*3/uL (ref 4.0–10.5)

## 2023-09-18 LAB — HEPATIC FUNCTION PANEL
ALT: 12 U/L (ref 0–53)
AST: 16 U/L (ref 0–37)
Albumin: 4.3 g/dL (ref 3.5–5.2)
Alkaline Phosphatase: 84 U/L (ref 39–117)
Bilirubin, Direct: 0.1 mg/dL (ref 0.0–0.3)
Total Bilirubin: 0.5 mg/dL (ref 0.2–1.2)
Total Protein: 6.9 g/dL (ref 6.0–8.3)

## 2023-09-18 LAB — TSH: TSH: 2.85 u[IU]/mL (ref 0.35–5.50)

## 2023-09-18 LAB — IBC + FERRITIN
Ferritin: 110.8 ng/mL (ref 22.0–322.0)
Iron: 110 ug/dL (ref 42–165)
Saturation Ratios: 40.1 % (ref 20.0–50.0)
TIBC: 274.4 ug/dL (ref 250.0–450.0)
Transferrin: 196 mg/dL — ABNORMAL LOW (ref 212.0–360.0)

## 2023-09-18 LAB — BASIC METABOLIC PANEL
BUN: 20 mg/dL (ref 6–23)
CO2: 29 meq/L (ref 19–32)
Calcium: 9 mg/dL (ref 8.4–10.5)
Chloride: 103 meq/L (ref 96–112)
Creatinine, Ser: 1.32 mg/dL (ref 0.40–1.50)
GFR: 51 mL/min — ABNORMAL LOW (ref >60.00)
Glucose, Bld: 110 mg/dL — ABNORMAL HIGH (ref 70–99)
Potassium: 4.9 meq/L (ref 3.5–5.1)
Sodium: 139 meq/L (ref 135–145)

## 2023-09-18 LAB — PSA: PSA: 2.39 ng/mL (ref 0.10–4.00)

## 2023-09-18 NOTE — Patient Instructions (Signed)
 Health Maintenance, Male  Adopting a healthy lifestyle and getting preventive care are important in promoting health and wellness. Ask your health care provider about:  The right schedule for you to have regular tests and exams.  Things you can do on your own to prevent diseases and keep yourself healthy.  What should I know about diet, weight, and exercise?  Eat a healthy diet    Eat a diet that includes plenty of vegetables, fruits, low-fat dairy products, and lean protein.  Do not eat a lot of foods that are high in solid fats, added sugars, or sodium.  Maintain a healthy weight  Body mass index (BMI) is a measurement that can be used to identify possible weight problems. It estimates body fat based on height and weight. Your health care provider can help determine your BMI and help you achieve or maintain a healthy weight.  Get regular exercise  Get regular exercise. This is one of the most important things you can do for your health. Most adults should:  Exercise for at least 150 minutes each week. The exercise should increase your heart rate and make you sweat (moderate-intensity exercise).  Do strengthening exercises at least twice a week. This is in addition to the moderate-intensity exercise.  Spend less time sitting. Even light physical activity can be beneficial.  Watch cholesterol and blood lipids  Have your blood tested for lipids and cholesterol at 81 years of age, then have this test every 5 years.  You may need to have your cholesterol levels checked more often if:  Your lipid or cholesterol levels are high.  You are older than 81 years of age.  You are at high risk for heart disease.  What should I know about cancer screening?  Many types of cancers can be detected early and may often be prevented. Depending on your health history and family history, you may need to have cancer screening at various ages. This may include screening for:  Colorectal cancer.  Prostate cancer.  Skin cancer.  Lung  cancer.  What should I know about heart disease, diabetes, and high blood pressure?  Blood pressure and heart disease  High blood pressure causes heart disease and increases the risk of stroke. This is more likely to develop in people who have high blood pressure readings or are overweight.  Talk with your health care provider about your target blood pressure readings.  Have your blood pressure checked:  Every 3-5 years if you are 81-95 years of age.  Every year if you are 81 years old or older.  If you are between the ages of 81 and 81 and are a current or former smoker, ask your health care provider if you should have a one-time screening for abdominal aortic aneurysm (AAA).  Diabetes  Have regular diabetes screenings. This checks your fasting blood sugar level. Have the screening done:  Once every three years after age 81 if you are at a normal weight and have a low risk for diabetes.  More often and at a younger age if you are overweight or have a high risk for diabetes.  What should I know about preventing infection?  Hepatitis B  If you have a higher risk for hepatitis B, you should be screened for this virus. Talk with your health care provider to find out if you are at risk for hepatitis B infection.  Hepatitis C  Blood testing is recommended for:  Everyone born from [birth year] through 1963.  Anyone  with known risk factors for hepatitis C.  Sexually transmitted infections (STIs)  You should be screened each year for STIs, including gonorrhea and chlamydia, if:  You are sexually active and are younger than 81 years of age.  You are older than 81 years of age and your health care provider tells you that you are at risk for this type of infection.  Your sexual activity has changed since you were last screened, and you are at increased risk for chlamydia or gonorrhea. Ask your health care provider if you are at risk.  Ask your health care provider about whether you are at high risk for HIV. Your health care provider  may recommend a prescription medicine to help prevent HIV infection. If you choose to take medicine to prevent HIV, you should first get tested for HIV. You should then be tested every 3 months for as long as you are taking the medicine.  Follow these instructions at home:  Alcohol use  Do not drink alcohol if your health care provider tells you not to drink.  If you drink alcohol:  Limit how much you have to 0-2 drinks a day.  Know how much alcohol is in your drink. In the [place] one drink equals one 12 oz bottle of beer (355 mL), one 5 oz glass of wine (148 mL), or one 1 oz glass of hard liquor (44 mL).  Lifestyle  Do not use any products that contain nicotine or tobacco. These products include cigarettes, chewing tobacco, and vaping devices, such as e-cigarettes. If you need help quitting, ask your health care provider.  Do not use street drugs.  Do not share needles.  Ask your health care provider for help if you need support or information about quitting drugs.  General instructions  Schedule regular health, dental, and eye exams.  Stay current with your vaccines.  Tell your health care provider if:  You often feel depressed.  You have ever been abused or do not feel safe at home.  Summary  Adopting a healthy lifestyle and getting preventive care are important in promoting health and wellness.  Follow your health care provider's instructions about healthy diet, exercising, and getting tested or screened for diseases.  Follow your health care provider's instructions on monitoring your cholesterol and blood pressure.  This information is not intended to replace advice given to you by your health care provider. Make sure you discuss any questions you have with your health care provider.  Document Revised: 11/06/2020 Document Reviewed: 11/06/2020  Elsevier Patient Education  2024 [employer].

## 2023-09-18 NOTE — Progress Notes (Unsigned)
 Subjective:  Patient ID: Fernando Saunders, male    DOB: 24-Jun-1941  Age: 83 y.o. MRN: [id number]  CC: Annual Exam, Hypertension, Hyperlipidemia, and Anemia  His daughter translates for him.  HPI [facility] presents for a CPX and f/up ----  Discussed the use of AI scribe software for clinical note transcription with the patient, who gave verbal consent to proceed.  History of Present Illness   Fernando Saunders is an 83 year old male who presents with bradycardia and dizziness. He is accompanied by his daughter.  He is experiencing bradycardia with a heart rate of 53 beats per minute, accompanied by dizziness and weakness, particularly when bending over and then standing up, which causes his head to spin momentarily. No dizziness occurs when walking upright. No swelling in the legs or feet, and no changes in weight or appetite. He is not currently taking any medications that would lower his heart rate. He has had an EKG within the last six months, and his daughter retrieved the results from May 12, 2023.  He has a history of anemia noted a year ago, but it is unclear if this has been rechecked since then. He is scheduled for lab work to recheck his anemia and provide a urine specimen.        Outpatient Medications Prior to Visit  Medication Sig Dispense Refill   aspirin EC 81 MG tablet Take 1 tablet (81 mg total) by mouth daily. Swallow whole. 100 tablet 3   atorvastatin (LIPITOR) 80 MG tablet Take 1 tablet (80 mg total) by mouth daily. 90 tablet 3   gabapentin (NEURONTIN) 100 MG capsule Take 1 capsule by mouth at bedtime 90 capsule 0   GEMTESA 75 MG TABS Take 1 tablet by mouth daily.     Multiple Vitamin (MULTIVITAMIN) tablet Take 1 tablet by mouth daily.     MYRBETRIQ 50 MG TB24 tablet Take 1 tablet by mouth daily.     omega-3 acid ethyl esters (LOVAZA) 1 g capsule Take by mouth as needed.     pantoprazole (PROTONIX) 40 MG tablet Take 1 tablet (40 mg total) by mouth daily. 90  tablet 3   triamcinolone cream (KENALOG) 0.1 % Apply 1 application topically daily as needed. 453 g 2   tamsulosin (FLOMAX) 0.4 MG CAPS capsule Take 0.4 mg by mouth daily.     No facility-administered medications prior to visit.    ROS Review of Systems  Constitutional: Negative.  Negative for appetite change, diaphoresis, fatigue and unexpected weight change.  HENT: Negative.    Eyes: Negative.   Respiratory: Negative.  Negative for cough, chest tightness, shortness of breath and wheezing.   Cardiovascular:  Negative for chest pain, palpitations and leg swelling.  Gastrointestinal: Negative.  Negative for abdominal pain, constipation, diarrhea, nausea and vomiting.  Endocrine: Negative.   Genitourinary: Negative.  Negative for decreased urine volume, difficulty urinating, scrotal swelling and testicular pain.  Musculoskeletal: Negative.   Skin: Negative.   Neurological: Negative.  Negative for dizziness and weakness.  Hematological:  Negative for adenopathy. Does not bruise/bleed easily.  Psychiatric/Behavioral: Negative.      Objective:  BP 134/70 (BP Location: Left Arm, Patient Position: Sitting, Cuff Size: Normal)   Pulse (!) 53   Temp (!) 97.5 F (36.4 C) (Temporal)   Resp 16   Ht 5\' 11"  (1.803 m)   Wt 183 lb 12.8 oz (83.4 kg)   SpO2 99%   BMI 25.63 kg/m   BP Readings from Last 3 Encounters:  09/18/23 134/70  08/15/23 130/80  05/12/23 110/64    Wt Readings from Last 3 Encounters:  09/18/23 183 lb 12.8 oz (83.4 kg)  08/15/23 181 lb 12.8 oz (82.5 kg)  05/12/23 182 lb (82.6 kg)    Physical Exam Vitals reviewed.  Constitutional:      Appearance: Normal appearance.  HENT:     Nose: Nose normal.     Mouth/Throat:     Mouth: Mucous membranes are moist.  Eyes:     General: No scleral icterus.    Conjunctiva/sclera: Conjunctivae normal.  Cardiovascular:     Rate and Rhythm: Bradycardia present.     Heart sounds: No murmur heard.    No friction rub. No gallop.   Pulmonary:     Effort: Pulmonary effort is normal.     Breath sounds: No stridor. No wheezing, rhonchi or rales.  Abdominal:     General: Abdomen is flat.     Palpations: There is no mass.     Tenderness: There is no abdominal tenderness. There is no guarding.     Hernia: No hernia is present.  Musculoskeletal:        General: Normal range of motion.     Cervical back: Neck supple.     Right lower leg: No edema.     Left lower leg: No edema.  Lymphadenopathy:     Cervical: No cervical adenopathy.  Skin:    General: Skin is warm and dry.  Neurological:     General: No focal deficit present.     Mental Status: He is alert. Mental status is at baseline.  Psychiatric:        Mood and Affect: Mood normal.        Behavior: Behavior normal.     Lab Results  Component Value Date   WBC 6.5 09/18/2023   HGB 12.8 (L) 09/18/2023   HCT 38.7 (L) 09/18/2023   PLT 204.0 09/18/2023   GLUCOSE 110 (H) 09/18/2023   CHOL 186 09/18/2023   TRIG 238.0 (H) 09/18/2023   HDL 33.10 (L) 09/18/2023   LDLDIRECT 110.0 08/15/2022   LDLCALC 106 (H) 09/18/2023   ALT 12 09/18/2023   AST 16 09/18/2023   NA 139 09/18/2023   K 4.9 09/18/2023   CL 103 09/18/2023   CREATININE 1.32 09/18/2023   BUN 20 09/18/2023   CO2 29 09/18/2023   TSH 2.85 09/18/2023   PSA 2.39 09/18/2023    NM PET CT CARDIAC PERFUSION MULTI W/ABSOLUTE BLOODFLOW Result Date: 04/02/2023   LV perfusion is normal. There is no evidence of ischemia. There is no evidence of infarction.   Rest left ventricular function is abnormal. Rest EF: 40%. Stress left ventricular function is normal. Stress EF: 53%. End diastolic cavity size is normal. End systolic cavity size is normal.   Myocardial blood flow was computed to be 0.26ml/g/min at rest and 1.24ml/g/min at stress. Global myocardial blood flow reserve was 2.63 and was normal.   Coronary calcium was present on the attenuation correction CT images. Moderate coronary calcifications were  present. Coronary calcifications were present in the left anterior descending artery distribution(s).   Initial perfusion images showed reversible lateral wall perfusion defect concerning for lateral ischemia.  However, flow reserve was normal globally and in each coronary distribution.  On closer examination appeared to be CT misregistration artifact.  This was corrected with resoluation of perfusion defect.   The study is normal. The study is low risk.   Electronically signed by Epifanio Lesches, MD  CLINICAL DATA:  This over-read does not include interpretation of cardiac or coronary anatomy or pathology. The Cardiac PET CT interpretation by the cardiologist is attached. COMPARISON:  09/16/2022 FINDINGS: Cardiovascular: Aortic atherosclerosis. Normal heart size. Left coronary artery calcifications. No pericardial effusion. Limited Mediastinum/Nodes: No enlarged mediastinal, hilar, or axillary lymph nodes. Trachea and esophagus demonstrate no significant findings. Limited Lungs/Pleura: Lungs are clear. No pleural effusion or pneumothorax. Upper Abdomen: No acute abnormality. Musculoskeletal: No chest wall abnormality. No acute osseous findings. IMPRESSION: 1. No acute CT findings of the chest. 2. Coronary artery disease. Aortic Atherosclerosis (ICD10-I70.0). Electronically Signed   By: Jearld Lesch M.D.   On: 03/26/2023 11:19   Assessment & Plan:  Bradycardia -     TSH; Future  Benign prostatic hyperplasia without lower urinary tract symptoms -     PSA; Future -     Urinalysis, Routine w reflex microscopic; Future  Encounter for general adult medical examination with abnormal findings  Deficiency anemia -     Vitamin B1; Future -     Zinc; Future -     IBC + Ferritin; Future -     Vitamin B12; Future -     CBC with Differential/Platelet; Future -     Folate; Future -     Reticulocytes; Future  Essential hypertension -     Basic metabolic panel; Future  Gastroesophageal reflux disease  without esophagitis -     CBC with Differential/Platelet; Future  Hyperlipidemia with target LDL less than 100 -     Lipid panel; Future -     Hepatic function panel; Future  CKD stage 3a, GFR 45-59 ml/min (HCC)     Follow-up: Return in about 6 months (around 03/20/2024).  Sanda Linger, MD

## 2023-09-19 ENCOUNTER — Encounter: Payer: Self-pay | Admitting: Internal Medicine

## 2023-09-19 DIAGNOSIS — N1831 Chronic kidney disease, stage 3a: Secondary | ICD-10-CM | POA: Insufficient documentation

## 2023-09-19 LAB — URINALYSIS, ROUTINE W REFLEX MICROSCOPIC
Bilirubin Urine: NEGATIVE
Hgb urine dipstick: NEGATIVE
Ketones, ur: NEGATIVE
Leukocytes,Ua: NEGATIVE
Nitrite: NEGATIVE
Specific Gravity, Urine: 1.02 (ref 1.000–1.030)
Total Protein, Urine: NEGATIVE
Urine Glucose: NEGATIVE
Urobilinogen, UA: 0.2 (ref 0.0–1.0)
pH: 6 (ref 5.0–8.0)

## 2023-09-19 LAB — FOLATE: Folate: 18.2 ng/mL (ref >5.9)

## 2023-09-19 LAB — VITAMIN B12: Vitamin B-12: 451 pg/mL (ref 211–911)

## 2023-09-23 LAB — RETICULOCYTES
ABS Retic: 61760 {cells}/uL (ref 25000–90000)
Retic Ct Pct: 1.6 %

## 2023-09-23 LAB — ZINC: Zinc: 63 ug/dL (ref 60–130)

## 2023-09-23 LAB — VITAMIN B1: Vitamin B1 (Thiamine): 20 nmol/L (ref 8–30)

## 2023-11-17 DIAGNOSIS — H34811 Central retinal vein occlusion, right eye, with macular edema: Secondary | ICD-10-CM | POA: Diagnosis not present

## 2023-11-19 ENCOUNTER — Encounter: Payer: Self-pay | Admitting: Internal Medicine

## 2023-12-17 DIAGNOSIS — H26491 Other secondary cataract, right eye: Secondary | ICD-10-CM | POA: Diagnosis not present

## 2024-03-19 ENCOUNTER — Ambulatory Visit: Admitting: Internal Medicine

## 2024-03-22 ENCOUNTER — Other Ambulatory Visit: Payer: Self-pay | Admitting: Internal Medicine

## 2024-03-22 DIAGNOSIS — R072 Precordial pain: Secondary | ICD-10-CM

## 2024-03-22 DIAGNOSIS — R0789 Other chest pain: Secondary | ICD-10-CM

## 2024-04-06 DIAGNOSIS — H35371 Puckering of macula, right eye: Secondary | ICD-10-CM | POA: Diagnosis not present

## 2024-04-06 DIAGNOSIS — H35033 Hypertensive retinopathy, bilateral: Secondary | ICD-10-CM | POA: Diagnosis not present

## 2024-04-06 DIAGNOSIS — H43813 Vitreous degeneration, bilateral: Secondary | ICD-10-CM | POA: Diagnosis not present

## 2024-04-06 DIAGNOSIS — Z961 Presence of intraocular lens: Secondary | ICD-10-CM | POA: Diagnosis not present

## 2024-04-06 DIAGNOSIS — H34811 Central retinal vein occlusion, right eye, with macular edema: Secondary | ICD-10-CM | POA: Diagnosis not present

## 2024-04-22 ENCOUNTER — Ambulatory Visit (HOSPITAL_COMMUNITY)
Admission: RE | Admit: 2024-04-22 | Discharge: 2024-04-22 | Disposition: A | Discharge status: Home / Self Care | Source: Ambulatory Visit | Attending: Internal Medicine | Admitting: Internal Medicine

## 2024-04-22 DIAGNOSIS — R072 Precordial pain: Secondary | ICD-10-CM | POA: Insufficient documentation

## 2024-04-22 DIAGNOSIS — R0789 Other chest pain: Secondary | ICD-10-CM | POA: Insufficient documentation

## 2024-04-22 LAB — ECHOCARDIOGRAM COMPLETE
Area-P 1/2: 2.46 cm2
S' Lateral: 3.21 cm

## 2024-05-11 ENCOUNTER — Ambulatory Visit: Attending: Internal Medicine | Admitting: Internal Medicine

## 2024-05-11 ENCOUNTER — Ambulatory Visit: Admitting: Internal Medicine

## 2024-05-11 ENCOUNTER — Ambulatory Visit: Payer: Self-pay | Admitting: Internal Medicine

## 2024-05-11 ENCOUNTER — Encounter: Payer: Self-pay | Admitting: Internal Medicine

## 2024-05-11 VITALS — BP 139/70 | HR 71 | Ht 72.0 in | Wt 182.2 lb

## 2024-05-11 DIAGNOSIS — E785 Hyperlipidemia, unspecified: Secondary | ICD-10-CM | POA: Diagnosis not present

## 2024-05-11 DIAGNOSIS — R0789 Other chest pain: Secondary | ICD-10-CM

## 2024-05-11 DIAGNOSIS — I10 Essential (primary) hypertension: Secondary | ICD-10-CM

## 2024-05-11 DIAGNOSIS — R079 Chest pain, unspecified: Secondary | ICD-10-CM

## 2024-05-11 NOTE — H&P (View-Only) (Signed)
 Cardiology Office Note:  .   Date:  05/11/2024  ID:  [name], DOB Oct 17, 1940, MRN [id number] PCP: Joshua Debby CROME, MD  [facility] Providers Cardiologist:  Soyla DELENA Merck, MD Electrophysiologist:  OLE ONEIDA HOLTS, MD    History of Present Illness: .   Fernando Saunders is a 83 y.o. male. Interpreter for Spanish present and assisted.   Discussed the use of AI scribe software for clinical note transcription with the patient, who gave verbal consent to proceed.  History of Present Illness Fernando Saunders is an 83 year old male who presents with persistent chest pain, felt to be chest wall pain after multiple cardiovascular investigations.  He experiences mild, persistent pain on one side of his chest, sometimes described as a weight. The pain varies in intensity and can worsen with deep breathing or physical exertion. He has undergone multiple diagnostic tests, including a PET stress test, CCTA, and echocardiogram, all with reassuring results. He takes cholesterol medication every three days due to concerns about potential side effects on his kidneys or lungs, I have advised him to take daily and we will monitor lab work for any issues.     ROS: negative except per HPI above.  Studies Reviewed: SABRA   EKG Interpretation Date/Time:  Tuesday May 11 2024 09:36:07 EST Ventricular Rate:  71 PR Interval:  174 QRS Duration:  90 QT Interval:  382 QTC Calculation: 415 R Axis:   8  Text Interpretation: Sinus rhythm with occasional Premature ventricular complexes When compared with ECG of 12-May-2023 08:17, Premature ventricular complexes are now Present Confirmed by Merck Soyla ([id number]) on 05/11/2024 9:59:21 AM    Results RADIOLOGY Cardiac PET scan: Normal (04/2023) Cardiac CT scan: Moderate nonobstructive CAD (2022)  DIAGNOSTIC Echocardiogram: Normal Risk Assessment/Calculations:       Physical Exam:   VS:  BP 139/70 (BP Location: Left Arm, Patient Position:  Sitting)   Pulse 71   Ht 6' (1.829 m)   Wt 182 lb 3.2 oz (82.6 kg)   SpO2 99%   BMI 24.71 kg/m    Wt Readings from Last 3 Encounters:  05/11/24 182 lb 3.2 oz (82.6 kg)  09/18/23 183 lb 12.8 oz (83.4 kg)  08/15/23 181 lb 12.8 oz (82.5 kg)     Physical Exam GENERAL: Alert, cooperative, well developed, no acute distress. HEENT: Normocephalic, normal oropharynx, moist mucous membranes. CHEST: Clear to auscultation bilaterally, no wheezes, rhonchi, or crackles. CARDIOVASCULAR: Normal heart rate and rhythm, S1 and S2 normal without murmurs. ABDOMEN: Soft, non-tender, non-distended, without organomegaly, normal bowel sounds. EXTREMITIES: No cyanosis or edema. NEUROLOGICAL: Cranial nerves grossly intact, moves all extremities without gross motor or sensory deficit.   ASSESSMENT AND PLAN: .    Assessment and Plan Assessment & Plan Chronic musculoskeletal chest wall pain Persistent chest pain likely musculoskeletal or nerve-related, with cardiac causes less likely with reassuring testing. He expressed concern about the pain's origin. I offered cardiac catheterization today, he declined heart catheterization unless in an emergency. - Referred to sports medicine specialist for further evaluation and management.  Moderate nonobstructive CAD - continue ASA 81 mg daily, atorvastatin  80 mg daily  Essential hypertension Blood pressure control suboptimal, requiring treatment adjustment to reduce cardiovascular risk. - Continue to monitor blood pressure and adjust treatment as necessary. - does not appear to be on therapy, can address further with PCP if needed. Patient hesitant to take medications.   Recording duration: 22 minutes   ADDENDUM 06/05/24: Patient decided after visit and after sports  medicine consultation that he would like to pursue cardiac catheterization. Discussed in detail at office visit. Consent below. This should serve as H+P for cath as it is scheduled 06/10/24.    Informed Consent   Shared Decision Making/Informed Consent The risks [stroke (1 in 1000), death (1 in 1000), kidney failure [usually temporary] (1 in 500), bleeding (1 in 200), allergic reaction [possibly serious] (1 in 200)], benefits (diagnostic support and management of coronary artery disease) and alternatives of a cardiac catheterization were discussed in detail with Mr. Smoak and he is willing to proceed.       Soyla Merck, MD, FACC

## 2024-05-11 NOTE — Patient Instructions (Addendum)
 Medication Instructions:  No Changes *If you need a refill on your cardiac medications before your next appointment, please call your pharmacy*  Lab Work: FASTING Lipid panel, Lipoprotein A, ESR, CRP; okay to drink water and/or black coffee (no cream/no sugar)   You may go to any of these [facility] locations:   [facility] - [address] ([facility]) - [address] [phone] [address] - [address] (1st floor, next to pharmacy)   [place] - [address]    [place]  - [address]    [place] - [address] - [address] [employer]  - [address] ([facility])  [place]   - [address], [address] If you have labs (blood work) drawn today and your tests are completely normal, you will receive your results only by: [employer] (if you have MyChart) OR A paper copy in the mail If you have any lab test that is abnormal or we need to change your treatment, we will call you to review the results.  Testing/Procedures: None  Follow-Up: At [facility] you and your health needs are our priority.  As part of our continuing mission to provide you with exceptional heart care, our providers are all part of one team.  This team includes your primary Cardiologist (physician) and Advanced Practice Providers or APPs (Physician Assistants and Nurse Practitioners) who all work together to provide you with the care you need, when you need it.  Your next appointment:   1 year(s)  Provider:   Rollo Louder, PA, or Aline Door, [place], or [place], [place]  Other: Please follow up with our Pharmacy team/Lipid Clinic after having labs drawn

## 2024-05-11 NOTE — Progress Notes (Addendum)
 Cardiology Office Note:  .   Date:  05/11/2024  ID:  Fernando Saunders, DOB Oct 17, 1940, MRN [id number] PCP: Joshua Debby CROME, MD  [facility] Providers Cardiologist:  Soyla DELENA Merck, MD Electrophysiologist:  OLE ONEIDA HOLTS, MD    History of Present Illness: .   Fernando Saunders is a 83 y.o. male. Interpreter for Spanish present and assisted.   Discussed the use of AI scribe software for clinical note transcription with the patient, who gave verbal consent to proceed.  History of Present Illness Fernando Saunders is an 83 year old male who presents with persistent chest pain, felt to be chest wall pain after multiple cardiovascular investigations.  He experiences mild, persistent pain on one side of his chest, sometimes described as a weight. The pain varies in intensity and can worsen with deep breathing or physical exertion. He has undergone multiple diagnostic tests, including a PET stress test, CCTA, and echocardiogram, all with reassuring results. He takes cholesterol medication every three days due to concerns about potential side effects on his kidneys or lungs, I have advised him to take daily and we will monitor lab work for any issues.     ROS: negative except per HPI above.  Studies Reviewed: SABRA   EKG Interpretation Date/Time:  Tuesday May 11 2024 09:36:07 EST Ventricular Rate:  71 PR Interval:  174 QRS Duration:  90 QT Interval:  382 QTC Calculation: 415 R Axis:   8  Text Interpretation: Sinus rhythm with occasional Premature ventricular complexes When compared with ECG of 12-May-2023 08:17, Premature ventricular complexes are now Present Confirmed by Merck Soyla ([id number]) on 05/11/2024 9:59:21 AM    Results RADIOLOGY Cardiac PET scan: Normal (04/2023) Cardiac CT scan: Moderate nonobstructive CAD (2022)  DIAGNOSTIC Echocardiogram: Normal Risk Assessment/Calculations:       Physical Exam:   VS:  BP 139/70 (BP Location: Left Arm, Patient Position:  Sitting)   Pulse 71   Ht 6' (1.829 m)   Wt 182 lb 3.2 oz (82.6 kg)   SpO2 99%   BMI 24.71 kg/m    Wt Readings from Last 3 Encounters:  05/11/24 182 lb 3.2 oz (82.6 kg)  09/18/23 183 lb 12.8 oz (83.4 kg)  08/15/23 181 lb 12.8 oz (82.5 kg)     Physical Exam GENERAL: Alert, cooperative, well developed, no acute distress. HEENT: Normocephalic, normal oropharynx, moist mucous membranes. CHEST: Clear to auscultation bilaterally, no wheezes, rhonchi, or crackles. CARDIOVASCULAR: Normal heart rate and rhythm, S1 and S2 normal without murmurs. ABDOMEN: Soft, non-tender, non-distended, without organomegaly, normal bowel sounds. EXTREMITIES: No cyanosis or edema. NEUROLOGICAL: Cranial nerves grossly intact, moves all extremities without gross motor or sensory deficit.   ASSESSMENT AND PLAN: .    Assessment and Plan Assessment & Plan Chronic musculoskeletal chest wall pain Persistent chest pain likely musculoskeletal or nerve-related, with cardiac causes less likely with reassuring testing. He expressed concern about the pain's origin. I offered cardiac catheterization today, he declined heart catheterization unless in an emergency. - Referred to sports medicine specialist for further evaluation and management.  Moderate nonobstructive CAD - continue ASA 81 mg daily, atorvastatin  80 mg daily  Essential hypertension Blood pressure control suboptimal, requiring treatment adjustment to reduce cardiovascular risk. - Continue to monitor blood pressure and adjust treatment as necessary. - does not appear to be on therapy, can address further with PCP if needed. Patient hesitant to take medications.   Recording duration: 22 minutes   ADDENDUM 06/05/24: Patient decided after visit and after sports  medicine consultation that he would like to pursue cardiac catheterization. Discussed in detail at office visit. Consent below. This should serve as H+P for cath as it is scheduled 06/10/24.    Informed Consent   Shared Decision Making/Informed Consent The risks [stroke (1 in 1000), death (1 in 1000), kidney failure [usually temporary] (1 in 500), bleeding (1 in 200), allergic reaction [possibly serious] (1 in 200)], benefits (diagnostic support and management of coronary artery disease) and alternatives of a cardiac catheterization were discussed in detail with Mr. Smoak and he is willing to proceed.       Soyla Merck, MD, FACC

## 2024-05-11 NOTE — Progress Notes (Deleted)
  Cardiology Office Note:  .   Date:  05/11/2024  ID:  [name], DOB Nov 28, 1940, MRN [id number] PCP: Joshua Debby CROME, MD  [facility] Providers Cardiologist:  Soyla DELENA Merck, MD Electrophysiologist:  OLE ONEIDA HOLTS, MD    History of Present Illness: .   Fernando Saunders is a 83 y.o. male.  Discussed the use of AI scribe software for clinical note transcription with the patient, who gave verbal consent to proceed.  History of Present Illness     ROS: negative except per HPI above.  Studies Reviewed: .        Results  Risk Assessment/Calculations:   {Does this patient have ATRIAL FIBRILLATION?:[phone]}   Physical Exam:   VS:  BP 139/70 (BP Location: Left Arm, Patient Position: Sitting)   Pulse 71   Ht 6' (1.829 m)   Wt 182 lb 3.2 oz (82.6 kg)   SpO2 99%   BMI 24.71 kg/m    Wt Readings from Last 3 Encounters:  05/11/24 182 lb 3.2 oz (82.6 kg)  09/18/23 183 lb 12.8 oz (83.4 kg)  08/15/23 181 lb 12.8 oz (82.5 kg)     Physical Exam    ASSESSMENT AND PLAN: .    Assessment and Plan Assessment & Plan       Soyla Merck, MD, FACC

## 2024-05-17 ENCOUNTER — Ambulatory Visit: Admitting: Internal Medicine

## 2024-05-21 NOTE — Progress Notes (Unsigned)
 Ben Jackson D.[facility] [address] [place] [address] Phone: [phone]   Assessment and Plan:     1. Chest wall pain (Primary) -Chronic with exacerbation, initial sports medicine visit - Patient presents for evaluation of left anterior chest wall pain present for 1+ year.  Most consistent with cardiac etiology - Patient describes pain as a constant pressure that worsens with exertion such as walking up a steep hill and reduces with rest.  Pain does not fit musculoskeletal patterns, neurologic patterns, or pulmonary etiology.  NTTP chest wall.  Pain has been constant and does not worsen with pectoralis or costochondral muscular activation.  Pain does not fit a neurologic pattern.  Patient did have reassuring results from CT chest, PET/CT cardiac perfusion, echocardiogram.  Patient was offered cardiac catheterization at cardiology visit, but declined.  Upon further discussion at today's visit, patient would be interested in discussing cardiac catheterization at this time.  Recommend patient return to cardiology to discuss next steps in workup -Start HEP for chest wall activation, breathing exercises  15 additional minutes spent for educating Therapeutic Home Exercise Program.  This included exercises focusing on stretching, strengthening, with focus on eccentric aspects.   Long term goals include an improvement in range of motion, strength, endurance as well as avoiding reinjury. Patient's frequency would include in 1-2 times a day, 3-5 times a week for a duration of 6-12 weeks. Proper technique shown and discussed handout in great detail with ATC.  All questions were discussed and answered.    Pertinent previous records reviewed include CT chest, PET/CT cardiac perfusion, echocardiogram, cardiology note  Patient accompanied by his daughter and interpreter.  Interpreter used throughout entirety of office visit  Follow Up: As needed    Subjective:   I, Cyncere Sontag, am serving as a neurosurgeon for Doctor Morene Mace  Chief Complaint: chest wall pain   HPI:   05/26/2024 Patient is a 83 year old male with chest wall pain. Patient states bilat shoulder pain that radiates to the chest wall. Pain for a year.no meds for the pain. Pain is all the time. Decreased ROM . States he feels a pressure on his chest. No MOI   Relevant Historical Information: Hypertension, GERD, CKD stage III, BPH, hyperlipidemia  Additional pertinent review of systems negative.   Current Outpatient Medications:    aspirin  EC 81 MG tablet, Take 1 tablet (81 mg total) by mouth daily. Swallow whole., Disp: 100 tablet, Rfl: 3   atorvastatin  (LIPITOR) 80 MG tablet, Take 1 tablet (80 mg total) by mouth daily., Disp: 90 tablet, Rfl: 3   gabapentin  (NEURONTIN ) 100 MG capsule, Take 1 capsule by mouth at bedtime, Disp: 90 capsule, Rfl: 0   GEMTESA 75 MG TABS, Take 1 tablet by mouth daily., Disp: , Rfl:    Multiple Vitamin (MULTIVITAMIN) tablet, Take 1 tablet by mouth daily., Disp: , Rfl:    MYRBETRIQ  50 MG TB24 tablet, Take 1 tablet by mouth daily., Disp: , Rfl:    omega-3 acid ethyl esters (LOVAZA) 1 g capsule, Take by mouth as needed., Disp: , Rfl:    pantoprazole  (PROTONIX ) 40 MG tablet, Take 1 tablet (40 mg total) by mouth daily., Disp: 90 tablet, Rfl: 3   triamcinolone  cream (KENALOG ) 0.1 %, Apply 1 application topically daily as needed., Disp: 453 g, Rfl: 2   Objective:     Vitals:   05/26/24 0932  BP: 138/82  Pulse: 77  SpO2: 97%  Weight: 183 lb (  83 kg)  Height: 6' (1.829 m)      Body mass index is 24.82 kg/m.    Physical Exam:    General: Well-appearing, cooperative, sitting comfortably in no acute distress.  HEENT: Normocephalic, atraumatic.   Neck: No gross abnormality.  Cardiovascular: No pallor or cyanosis. Resp: Comfortable WOB.   Abdomen: Non distended.   Skin: Warm and dry; no focal rashes identified on limited  exam. Extremities: No cyanosis or edema.  Neuro: Gross motor and sensory intact. Gait normal. Psychiatric: Mood and affect are appropriate.    Electronically signed by:  Odis Mace D.[facility] 10:23 AM 05/26/24

## 2024-05-26 ENCOUNTER — Telehealth: Payer: Self-pay | Admitting: Internal Medicine

## 2024-05-26 ENCOUNTER — Ambulatory Visit: Admitting: Sports Medicine

## 2024-05-26 VITALS — BP 138/82 | HR 77 | Ht 72.0 in | Wt 183.0 lb

## 2024-05-26 DIAGNOSIS — E785 Hyperlipidemia, unspecified: Secondary | ICD-10-CM

## 2024-05-26 DIAGNOSIS — I10 Essential (primary) hypertension: Secondary | ICD-10-CM

## 2024-05-26 DIAGNOSIS — R0789 Other chest pain: Secondary | ICD-10-CM | POA: Diagnosis not present

## 2024-05-26 DIAGNOSIS — R079 Chest pain, unspecified: Secondary | ICD-10-CM

## 2024-05-26 NOTE — Patient Instructions (Signed)
 Chest wall HEP   Recommend seeing cardiologist again and ask about cardiac cath  As needed

## 2024-05-26 NOTE — Telephone Encounter (Signed)
 Called and spoke to pt (in Spanish). He has no new symptoms; continues to have left sided chest pain and also describes DOE. He would like to be seen asap to discuss heart cath options.   Explained heart cath procedure to him and daughter.   DOD appointment made for 06/03/24. Pt notified.

## 2024-05-26 NOTE — Telephone Encounter (Signed)
 Called and spoke to daughter. She will expect a phone call on Monday 05/31/24 to cancel DOD appointment and instead, be scheduled for a LHC. In agreement and grateful for phone call.

## 2024-05-26 NOTE — Telephone Encounter (Signed)
 Pt daughter called in stating they saw sports medicine and was told pt CP is not muscle related. She asked what should do now moving forward. Please advise.

## 2024-05-27 ENCOUNTER — Ambulatory Visit: Payer: Self-pay | Admitting: Internal Medicine

## 2024-05-27 LAB — LIPOPROTEIN A (LPA): Lipoprotein (a): 42.9 nmol/L (ref <75.0)

## 2024-05-27 LAB — LIPID PANEL
Chol/HDL Ratio: 3.5 ratio (ref 0.0–5.0)
Cholesterol, Total: 140 mg/dL (ref 100–199)
HDL: 40 mg/dL (ref >39)
LDL Chol Calc (NIH): 81 mg/dL (ref 0–99)
Triglycerides: 105 mg/dL (ref 0–149)
VLDL Cholesterol Cal: 19 mg/dL (ref 5–40)

## 2024-05-27 LAB — SEDIMENTATION RATE: Sed Rate: 12 mm/h (ref 0–30)

## 2024-05-27 LAB — C-REACTIVE PROTEIN: CRP: <1 mg/L (ref 0–10)

## 2024-05-31 NOTE — Addendum Note (Signed)
 Addended by: TRUDY FRANKEY SAUNDERS on: 05/31/2024 04:58 PM   Modules accepted: Orders

## 2024-05-31 NOTE — Telephone Encounter (Signed)
 Called and spoke to daughter and patient in agreement with LHC scheduled 12/11 @10 :30. Made daughter aware that patient left CATH has been scheduled for 10:30 and needs to be there at 8:30 am. Daughter and patient verbalized an understanding. Appt cancelled per Dr. Loni and Patient and Daughter for Dec 4. 2025. Instruction sent to daughter and patient via pt message. Understanding verbalized.

## 2024-06-03 ENCOUNTER — Ambulatory Visit: Admitting: Internal Medicine

## 2024-06-04 ENCOUNTER — Other Ambulatory Visit: Payer: Self-pay

## 2024-06-04 DIAGNOSIS — I10 Essential (primary) hypertension: Secondary | ICD-10-CM

## 2024-06-04 LAB — CBC
Hematocrit: 38.1 % (ref 37.5–51.0)
Hemoglobin: 12.1 g/dL — ABNORMAL LOW (ref 13.0–17.7)
MCH: 32.4 pg (ref 26.6–33.0)
MCHC: 31.8 g/dL (ref 31.5–35.7)
MCV: 102 fL — ABNORMAL HIGH (ref 79–97)
Platelets: 246 x10E3/uL (ref 150–450)
RBC: 3.73 x10E6/uL — ABNORMAL LOW (ref 4.14–5.80)
RDW: 11.4 % — ABNORMAL LOW (ref 11.6–15.4)
WBC: 7.6 x10E3/uL (ref 3.4–10.8)

## 2024-06-04 LAB — BASIC METABOLIC PANEL WITH GFR
BUN/Creatinine Ratio: 16 (ref 10–24)
BUN: 17 mg/dL (ref 8–27)
CO2: 23 mmol/L (ref 20–29)
Calcium: 9.1 mg/dL (ref 8.6–10.2)
Chloride: 104 mmol/L (ref 96–106)
Creatinine, Ser: 1.04 mg/dL (ref 0.76–1.27)
Glucose: 82 mg/dL (ref 70–99)
Potassium: 4.2 mmol/L (ref 3.5–5.2)
Sodium: 140 mmol/L (ref 134–144)
eGFR: 72 mL/min/1.73 (ref >59)

## 2024-06-05 ENCOUNTER — Ambulatory Visit: Payer: Self-pay | Admitting: Internal Medicine

## 2024-06-05 NOTE — Addendum Note (Signed)
 Addended by: Makenli Derstine A on: 06/05/2024 12:40 PM   Modules accepted: Orders

## 2024-06-08 ENCOUNTER — Telehealth: Payer: Self-pay | Admitting: *Deleted

## 2024-06-08 NOTE — Telephone Encounter (Signed)
 Cardiac Catheterization scheduled at [facility] for: Thursday June 10, 2024 10:30 AM Arrival time [facility] Main Entrance A at: 8:30 AM  Diet: -Nothing to eat after midnight.  Hydration: -May drink clear liquids until 2 hours before the procedure.  Approved liquids: Water, clear tea, black coffee, fruit juices-non-citric and without pulp,Gatorade, plain Jello/popsicles.   -Please drink 16 oz of water 2 hours before procedure.  Medication instructions: -Usual morning medications can be taken including aspirin  81 mg.  Plan to go home the same day, you will only stay overnight if medically necessary.  You must have responsible adult to drive you home.  Someone must be with you the first 24 hours after you arrive home.  Reviewed procedure instructions with patient's daughter (DPR), Anatalia.

## 2024-06-10 ENCOUNTER — Ambulatory Visit (HOSPITAL_COMMUNITY)
Admission: RE | Admit: 2024-06-10 | Discharge: 2024-06-10 | Disposition: A | Attending: Cardiology | Admitting: Cardiology

## 2024-06-10 ENCOUNTER — Other Ambulatory Visit: Payer: Self-pay

## 2024-06-10 ENCOUNTER — Ambulatory Visit (HOSPITAL_COMMUNITY): Admission: RE | Disposition: A | Payer: Self-pay | Attending: Cardiology

## 2024-06-10 DIAGNOSIS — I10 Essential (primary) hypertension: Secondary | ICD-10-CM | POA: Diagnosis not present

## 2024-06-10 DIAGNOSIS — Z7982 Long term (current) use of aspirin: Secondary | ICD-10-CM | POA: Diagnosis not present

## 2024-06-10 DIAGNOSIS — R079 Chest pain, unspecified: Secondary | ICD-10-CM | POA: Diagnosis not present

## 2024-06-10 DIAGNOSIS — R0789 Other chest pain: Secondary | ICD-10-CM | POA: Diagnosis present

## 2024-06-10 DIAGNOSIS — Z79899 Other long term (current) drug therapy: Secondary | ICD-10-CM | POA: Diagnosis not present

## 2024-06-10 DIAGNOSIS — I2510 Atherosclerotic heart disease of native coronary artery without angina pectoris: Secondary | ICD-10-CM | POA: Diagnosis not present

## 2024-06-10 HISTORY — PX: LEFT HEART CATH AND CORONARY ANGIOGRAPHY: CATH118249

## 2024-06-10 SURGERY — LEFT HEART CATH AND CORONARY ANGIOGRAPHY
Anesthesia: LOCAL

## 2024-06-10 MED ORDER — HEPARIN (PORCINE) IN NACL 1000-0.9 UT/500ML-% IV SOLN
INTRAVENOUS | Status: DC | PRN
  Administered 2024-06-10: 1000 mL

## 2024-06-10 MED ORDER — VERAPAMIL HCL 2.5 MG/ML IV SOLN
INTRAVENOUS | Status: AC
  Filled 2024-06-10: qty 2

## 2024-06-10 MED ORDER — SODIUM CHLORIDE 0.9% FLUSH: 3.0000 mL | INTRAVENOUS | Status: DC | PRN

## 2024-06-10 MED ORDER — HEPARIN SODIUM (PORCINE) 1000 UNIT/ML IJ SOLN
INTRAMUSCULAR | Status: AC
  Filled 2024-06-10: qty 10

## 2024-06-10 MED ORDER — LIDOCAINE HCL (PF) 1 % IJ SOLN
INTRAMUSCULAR | Status: AC
  Filled 2024-06-10: qty 30

## 2024-06-10 MED ORDER — FENTANYL CITRATE (PF) 100 MCG/2ML IJ SOLN
INTRAMUSCULAR | Status: DC | PRN
  Administered 2024-06-10: 25 ug via INTRAVENOUS

## 2024-06-10 MED ORDER — FREE WATER: 500.0000 mL | Freq: Once | Status: DC

## 2024-06-10 MED ORDER — ASPIRIN 81 MG PO CHEW: 81.0000 mg | CHEWABLE_TABLET | ORAL | Status: DC

## 2024-06-10 MED ORDER — SODIUM CHLORIDE 0.9 % IV SOLN: 250.0000 mL | INTRAVENOUS | Status: DC | PRN

## 2024-06-10 MED ORDER — MIDAZOLAM HCL 2 MG/2ML IJ SOLN
INTRAMUSCULAR | Status: AC
  Filled 2024-06-10: qty 2

## 2024-06-10 MED ORDER — LIDOCAINE HCL (PF) 1 % IJ SOLN
INTRAMUSCULAR | Status: DC | PRN
  Administered 2024-06-10: 2 mL

## 2024-06-10 MED ORDER — SODIUM CHLORIDE 0.9% FLUSH: 3.0000 mL | Freq: Two times a day (BID) | INTRAVENOUS | Status: DC

## 2024-06-10 MED ORDER — VERAPAMIL HCL 2.5 MG/ML IV SOLN
INTRAVENOUS | Status: DC | PRN
  Administered 2024-06-10 (×2): 10 mL via INTRA_ARTERIAL

## 2024-06-10 MED ORDER — NITROGLYCERIN 0.4 MG SL SUBL
SUBLINGUAL_TABLET | SUBLINGUAL | Status: AC
  Filled 2024-06-10: qty 1

## 2024-06-10 MED ORDER — FENTANYL CITRATE (PF) 100 MCG/2ML IJ SOLN
INTRAMUSCULAR | Status: AC
  Filled 2024-06-10: qty 2

## 2024-06-10 MED ORDER — MIDAZOLAM HCL (PF) 2 MG/2ML IJ SOLN
INTRAMUSCULAR | Status: DC | PRN
  Administered 2024-06-10: 2 mg via INTRAVENOUS

## 2024-06-10 MED ORDER — IOHEXOL 350 MG/ML SOLN
INTRAVENOUS | Status: DC | PRN
  Administered 2024-06-10: 35 mL

## 2024-06-10 MED ORDER — HEPARIN SODIUM (PORCINE) 1000 UNIT/ML IJ SOLN
INTRAMUSCULAR | Status: DC | PRN
  Administered 2024-06-10: 4000 [IU] via INTRAVENOUS

## 2024-06-10 SURGICAL SUPPLY — 8 items
CATH 5FR JL3.5 JR4 ANG PIG MP (CATHETERS) IMPLANT
CATH INFINITI AMBI 5FR TG (CATHETERS) IMPLANT
DEVICE RAD COMP TR BAND LRG (VASCULAR PRODUCTS) IMPLANT
GLIDESHEATH SLEND A-KIT 6F 22G (SHEATH) IMPLANT
GUIDEWIRE INQWIRE 1.5J.035X260 (WIRE) IMPLANT
KIT NAMIC PS PRESSURIZED FLUID (KITS) IMPLANT
PACK CARDIAC CATHETERIZATION (CUSTOM PROCEDURE TRAY) ×1 IMPLANT
SET ATX-X65L (MISCELLANEOUS) IMPLANT

## 2024-06-10 NOTE — Interval H&P Note (Signed)
 History and Physical Interval Note:  06/10/2024 12:18 PM  [facility]  has presented today for surgery, with the diagnosis of CP.  The various methods of treatment have been discussed with the patient and family. After consideration of risks, benefits and other options for treatment, the patient has consented to  Procedures: LEFT HEART CATH AND CORONARY ANGIOGRAPHY (N/A) and possible coronary angioplasty as a surgical intervention.  The patient's history has been reviewed, patient examined, no change in status, stable for surgery.  I have reviewed the patient's chart and labs.  Questions were answered to the patient's satisfaction.     Gordy Bergamo

## 2024-06-10 NOTE — Discharge Instructions (Signed)

## 2024-06-10 NOTE — Progress Notes (Signed)
 Discharge instructions reviewed with patient and Daughter Anatalia at the bedside. All questions and concerns addressed prior to discharge. PT ambulated in the hallway, voided in the bathroom, without difficulty. Tolerated PO intake. TR Band removed no s/s of complications at the incision sire. PT escorted from the unit via wheel chair to personal vehicle.

## 2024-06-11 ENCOUNTER — Encounter (HOSPITAL_COMMUNITY): Payer: Self-pay | Admitting: Cardiology

## 2024-06-11 ENCOUNTER — Telehealth: Payer: Self-pay | Admitting: Internal Medicine

## 2024-06-11 MED FILL — Nitroglycerin SL Tab 0.4 MG: SUBLINGUAL | Qty: 1 | Status: AC

## 2024-06-11 NOTE — Telephone Encounter (Signed)
 Spoke with pts daughter, patient in background of call. Pt wanted to move appt stating he would be out of town during that time. Offered another appointment, however patient said he would call once he got back into town. Daughter stated he would be back in town in February. Will forward to provider.

## 2024-06-11 NOTE — Telephone Encounter (Signed)
 Daughter Theresa) stated patient will be out of town on 12/30 and wants to know if patient can have sooner f/u visit.

## 2024-06-15 ENCOUNTER — Ambulatory Visit

## 2024-06-21 NOTE — Telephone Encounter (Signed)
 Called and spoke to pt's daughter Theresa). Her father is doing fine and he has not had any complications r/t cath site (radial). No redness, bruising, drainage. NO other cardiology related concerns for now. (Her son has the flu at home and she has been busy with this).   06/21/2024

## 2024-06-29 ENCOUNTER — Ambulatory Visit: Admitting: Nurse Practitioner

## 2024-08-19 ENCOUNTER — Ambulatory Visit: Payer: 59

## 2024-08-23 ENCOUNTER — Ambulatory Visit: Payer: 59

## 2024-09-16 ENCOUNTER — Ambulatory Visit: Payer: 59 | Admitting: Internal Medicine

## 2024-09-20 ENCOUNTER — Ambulatory Visit: Payer: 59 | Admitting: Internal Medicine

## 2024-09-20 ENCOUNTER — Encounter: Payer: 59 | Admitting: Internal Medicine

## 2024-09-20 ENCOUNTER — Ambulatory Visit: Payer: 59
